# Patient Record
Sex: Male | Born: 1965 | Race: Black or African American | Hispanic: No | Marital: Married | Smoking: Never smoker
Health system: Southern US, Community
[De-identification: ages and names within clinical notes are randomized; demographics above are authoritative.]

## PROBLEM LIST (undated history)

## (undated) DIAGNOSIS — I10 Essential (primary) hypertension: Secondary | ICD-10-CM

## (undated) DIAGNOSIS — Z860101 Personal history of adenomatous and serrated colon polyps: Secondary | ICD-10-CM

## (undated) DIAGNOSIS — Z86010 Personal history of colonic polyps: Secondary | ICD-10-CM

## (undated) DIAGNOSIS — M1990 Unspecified osteoarthritis, unspecified site: Secondary | ICD-10-CM

## (undated) DIAGNOSIS — K4021 Bilateral inguinal hernia, without obstruction or gangrene, recurrent: Secondary | ICD-10-CM

## (undated) DIAGNOSIS — K219 Gastro-esophageal reflux disease without esophagitis: Secondary | ICD-10-CM

## (undated) DIAGNOSIS — E119 Type 2 diabetes mellitus without complications: Secondary | ICD-10-CM

## (undated) DIAGNOSIS — N401 Enlarged prostate with lower urinary tract symptoms: Secondary | ICD-10-CM

## (undated) DIAGNOSIS — E785 Hyperlipidemia, unspecified: Secondary | ICD-10-CM

## (undated) DIAGNOSIS — Z8619 Personal history of other infectious and parasitic diseases: Secondary | ICD-10-CM

## (undated) HISTORY — DX: Essential (primary) hypertension: I10

## (undated) HISTORY — DX: Unspecified osteoarthritis, unspecified site: M19.90

## (undated) HISTORY — PX: INGUINAL HERNIA REPAIR: SUR1180

## (undated) HISTORY — DX: Personal history of other infectious and parasitic diseases: Z86.19

## (undated) HISTORY — DX: Type 2 diabetes mellitus without complications: E11.9

---
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
## 2017-01-09 ENCOUNTER — Ambulatory Visit (HOSPITAL_COMMUNITY)
Admission: EM | Admit: 2017-01-09 | Discharge: 2017-01-09 | Disposition: A | Discharge status: Home / Self Care | Payer: Managed Care, Other (non HMO) | Attending: Internal Medicine | Admitting: Internal Medicine

## 2017-01-09 ENCOUNTER — Encounter (HOSPITAL_COMMUNITY): Payer: Self-pay | Admitting: Nurse Practitioner

## 2017-01-09 DIAGNOSIS — B9789 Other viral agents as the cause of diseases classified elsewhere: Secondary | ICD-10-CM | POA: Diagnosis not present

## 2017-01-09 DIAGNOSIS — J069 Acute upper respiratory infection, unspecified: Secondary | ICD-10-CM

## 2017-01-09 MED ORDER — BENZONATATE 100 MG PO CAPS: 100.0000 mg | ORAL_CAPSULE | Freq: Three times a day (TID) | ORAL | 0 refills | Status: DC

## 2017-01-09 MED ORDER — IPRATROPIUM BROMIDE 0.06 % NA SOLN: 2.0000 | Freq: Four times a day (QID) | NASAL | 0 refills | Status: DC

## 2017-01-09 NOTE — Discharge Instructions (Signed)
You most likely have a viral respiratory infection, this type of infection will not be helped by antibiotics.  For your symptoms I have prescribed ipratropium nasal spray, 2 sprays each nostril up to 4 times a day. For cough, I have prescribed a medication called Tessalon. Take 1 tablet every 8 hours as needed for your cough.    In addition to these therapies, I advise rest, plenty of fluids and management of symptoms with over the counter medicines. Over the counter therapies for your symptoms include:Tylenol as needed every 4-6 hours for body aches or fever, not to exceed 4,000 mg a day, Take mucinex or mucinex DM ever 12 hours with a full glass of water, you may use an inhaled steroid such as Flonase, 2 sprays each nostril once a day for congestion, or an antihistamine such as Claritin or Zyrtec once a day. Another alternative for congestion, is a pseudoephedrine containing product available from the pharmacist. Should your symptoms worsen or fail to resolve, follow up with your primary care provider or return to clinic.

## 2017-01-09 NOTE — ED Triage Notes (Signed)
Pt presents with c/o cough/cold. His symptoms began about 2 days ago. He c/o malaise, sweats, headache, congestion, productive cough. He has tried dayquil, cough tablets with no improvement.

## 2017-01-09 NOTE — ED Provider Notes (Signed)
  [place]   [id number] 01/09/17 Arrival Time: 7412   SUBJECTIVE:  Troy Arellano is a 51 y.o. male who presents to the urgent care with complaint of cough, congestion, runny nose and has been ongoing for 2 days. No vomiting, no diarrhea, no constipation or abdominal pain, no chest pain or palpitations, no wheezing or shortness of breath. No change in appetite, urinary output. He does not smoke, and has no history of lung disease such as asthma.     No past medical history on file. No family history on file. Social History   Social History  . Marital status: Married    Spouse name: N/A  . Number of children: N/A  . Years of education: N/A   Occupational History  . Not on file.   Social History Main Topics  . Smoking status: Never Smoker  . Smokeless tobacco: Never Used  . Alcohol use Yes  . Drug use: No  . Sexual activity: Not on file   Other Topics Concern  . Not on file   Social History Narrative  . No narrative on file   No outpatient prescriptions have been marked as taking for the 01/09/17 encounter [facility] Encounter).   No Known Allergies    ROS: As per HPI, remainder of ROS negative.   OBJECTIVE:   Vitals:   01/09/17 1804  BP: (!) 132/92  Pulse: 65  Resp: 16  Temp: 98.1 F (36.7 C)  TempSrc: Oral  SpO2: 99%     General appearance: alert; no distress Eyes: PERRL; EOMI; conjunctiva normal HENT: normocephalic; atraumatic; TMs normal, canal normal, external ears normal without trauma; nasal mucosa normal; oral mucosa normal Neck: supple, No cervical lymphadenopathy Lungs: clear to auscultation bilaterally Heart: regular rate and rhythm Abdomen: soft, non-tender Back: no CVA tenderness Extremities: no cyanosis or edema; symmetrical with no gross deformities Skin: warm and dry Neurologic: normal gait; grossly normal Psychological: alert and cooperative; normal mood and affect      Labs:  No results found for this or any  previous visit.  Labs Reviewed - No data to display  No results found.     ASSESSMENT & PLAN:  1. Viral URI with cough     Meds ordered this encounter  Medications  . benzonatate (TESSALON) 100 MG capsule    Sig: Take 1 capsule (100 mg total) by mouth every 8 (eight) hours.    Dispense:  21 capsule    Refill:  0    Order Specific Question:   Supervising Provider    Answer:   Sherlene Shams [[id number]]  . ipratropium (ATROVENT) 0.06 % nasal spray    Sig: Place 2 sprays into both nostrils 4 (four) times daily.    Dispense:  15 mL    Refill:  0    Order Specific Question:   Supervising Provider    Answer:   Sherlene Shams [[id number]]   Recommend Rest, fluids, over-the-counter therapies for symptom management. Return as needed Reviewed expectations re: course of current medical issues. Questions answered. Outlined signs and symptoms indicating need for more acute intervention. Patient verbalized understanding. After Visit Summary given.    Procedures:        Barnet Glasgow, NP 01/09/17 [phone]

## 2017-04-12 ENCOUNTER — Encounter (HOSPITAL_COMMUNITY): Payer: Self-pay

## 2017-04-12 ENCOUNTER — Other Ambulatory Visit: Payer: Self-pay

## 2017-04-12 ENCOUNTER — Emergency Department (HOSPITAL_COMMUNITY)
Admission: EM | Admit: 2017-04-12 | Discharge: 2017-04-12 | Disposition: A | Discharge status: Home / Self Care | Payer: Managed Care, Other (non HMO) | Attending: Emergency Medicine | Admitting: Emergency Medicine

## 2017-04-12 DIAGNOSIS — Y9389 Activity, other specified: Secondary | ICD-10-CM | POA: Diagnosis not present

## 2017-04-12 DIAGNOSIS — X58XXXA Exposure to other specified factors, initial encounter: Secondary | ICD-10-CM | POA: Diagnosis not present

## 2017-04-12 DIAGNOSIS — Y998 Other external cause status: Secondary | ICD-10-CM | POA: Insufficient documentation

## 2017-04-12 DIAGNOSIS — S39012A Strain of muscle, fascia and tendon of lower back, initial encounter: Secondary | ICD-10-CM

## 2017-04-12 DIAGNOSIS — Z79899 Other long term (current) drug therapy: Secondary | ICD-10-CM | POA: Insufficient documentation

## 2017-04-12 DIAGNOSIS — S3992XA Unspecified injury of lower back, initial encounter: Secondary | ICD-10-CM | POA: Diagnosis present

## 2017-04-12 DIAGNOSIS — Y929 Unspecified place or not applicable: Secondary | ICD-10-CM | POA: Diagnosis not present

## 2017-04-12 MED ORDER — TRAMADOL HCL 50 MG PO TABS: 50.0000 mg | ORAL_TABLET | Freq: Four times a day (QID) | ORAL | 0 refills | Status: DC | PRN

## 2017-04-12 MED ORDER — CYCLOBENZAPRINE HCL 10 MG PO TABS: 10.0000 mg | ORAL_TABLET | Freq: Two times a day (BID) | ORAL | 0 refills | Status: DC | PRN

## 2017-04-12 NOTE — ED Notes (Signed)
See EDP secondary assessment.  

## 2017-04-12 NOTE — ED Notes (Signed)
Dr. Darl Householder at bedside at this time.
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
## 2017-04-12 NOTE — Discharge Instructions (Signed)
Thank you for allowing [place] to provide your care.   Do not drive or operate large machinery while taking the cyclobenzaprine or tramadol.    Please establish care with a primary care doctor as soon as possible.

## 2017-04-12 NOTE — ED Triage Notes (Signed)
Pt states that he was moving a dresser two days ago and since lower back has been hurting

## 2017-06-19 ENCOUNTER — Ambulatory Visit: Payer: Self-pay

## 2017-06-19 ENCOUNTER — Encounter: Payer: Self-pay | Admitting: Podiatry

## 2017-06-19 DIAGNOSIS — M2140 Flat foot [pes planus] (acquired), unspecified foot: Secondary | ICD-10-CM

## 2017-07-03 ENCOUNTER — Encounter: Payer: Self-pay | Admitting: Podiatry

## 2017-07-03 ENCOUNTER — Ambulatory Visit (INDEPENDENT_AMBULATORY_CARE_PROVIDER_SITE_OTHER): Payer: BLUE CROSS/BLUE SHIELD

## 2017-07-03 ENCOUNTER — Ambulatory Visit: Payer: BLUE CROSS/BLUE SHIELD | Admitting: Podiatry

## 2017-07-03 VITALS — BP 133/73 | HR 67 | Resp 16

## 2017-07-03 DIAGNOSIS — M66872 Spontaneous rupture of other tendons, left ankle and foot: Secondary | ICD-10-CM | POA: Diagnosis not present

## 2017-07-03 DIAGNOSIS — M2140 Flat foot [pes planus] (acquired), unspecified foot: Secondary | ICD-10-CM | POA: Diagnosis not present

## 2017-07-03 MED ORDER — METHYLPREDNISOLONE 4 MG PO TBPK: ORAL_TABLET | ORAL | 0 refills | Status: DC

## 2017-07-03 MED ORDER — MELOXICAM 15 MG PO TABS: 15.0000 mg | ORAL_TABLET | Freq: Every day | ORAL | 3 refills | Status: DC

## 2017-07-03 NOTE — Progress Notes (Signed)
  Subjective:  Patient ID: Troy Arellano, male    DOB: December 18, 1965,  MRN: [id number] HPI Chief Complaint  Patient presents with  . 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 male presents with the above complaint.     No past medical history on file. No past surgical history on file. No current outpatient medications on file.  No Known Allergies Review of Systems  Musculoskeletal: Positive for gait problem.  All other systems reviewed and are negative.  Objective:   Vitals:   07/03/17 1412  BP: 133/73  Pulse: 67  Resp: 16    General: Well developed, nourished, in no acute distress, alert and oriented x3   Dermatological: Skin is warm, dry and supple bilateral. Nails x 10 are well maintained; remaining integument appears unremarkable at this time. There are no open sores, no preulcerative lesions, no rash or signs of infection present.  Vascular: Dorsalis Pedis artery and Posterior Tibial artery pedal pulses are 2/4 bilateral with immedate capillary fill time. Pedal hair growth present. No varicosities and no lower extremity edema present bilateral.   Neruologic: Grossly intact via light touch bilateral. Vibratory intact via tuning fork bilateral. Protective threshold with Semmes Wienstein monofilament intact to all pedal sites bilateral. Patellar and Achilles deep tendon reflexes 2+ bilateral. No Babinski or clonus noted bilateral.   Musculoskeletal: No gross boney pedal deformities bilateral. No pain, crepitus, or limitation noted with foot and ankle range of motion bilateral. Muscular strength 5/5 in all groups tested bilateral.  He has absolutely no inversion capability actively.  Passively his foot is easily and verbal but still retains calcaneal valgus.  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  There is no erythema cellulitis  drainage or odor there is moderate edema to the area.  Gait: Unassisted, Nonantalgic.    Radiographs:  3 views radiographs left foot taken today demonstrates an osseously mature foot with complete collapse of the medial longitudinal arch and severe abduction of the forefoot on the rear foot.  I do not see any coalitions of the subtalar joint.  There is soft tissue swelling along the medial aspect of the talonavicular joint and medial aspect of the ankle.  Assessment & Plan:   Assessment: Posterior tibial tendon tear or rupture left foot complete collapse of the medial longitudinal arch.  Rule out PT TD  Plan: Placed him on a Medrol Dosepak to be followed by indomethacin.  Discussed appropriate shoe gear stretching exercises ice therapy as your modifications.  At this point placed him in a Tri-Lock brace in place him out of work if necessary.  We are requesting MRI for evaluation of the tendon and rupture site for surgical repair.     Kameka Whan T. Wilson, [place]

## 2017-07-04 ENCOUNTER — Telehealth: Payer: Self-pay | Admitting: *Deleted

## 2017-07-04 DIAGNOSIS — M66872 Spontaneous rupture of other tendons, left ankle and foot: Secondary | ICD-10-CM

## 2017-07-04 NOTE — Telephone Encounter (Signed)
Orders to J. Quintana, RN for pre-cert, faxed to [facility]. 

## 2017-07-04 NOTE — Telephone Encounter (Signed)
-----   Message from [place] sent at 07/03/2017  2:29 PM EST ----- Regarding: MRI MRI ankle left - evaluate posterior tibial tendon tear left - surgical consideration

## 2017-07-07 ENCOUNTER — Telehealth: Payer: Self-pay | Admitting: Podiatry

## 2017-07-07 NOTE — Telephone Encounter (Signed)
I was in last week and I was calling about my MRI. Please call me back at [phone]. Thank you.

## 2017-07-07 NOTE — Telephone Encounter (Signed)
Left message informing pt, he could schedule his MRI for his convenience with [facility] [phone], and to allow 5-7 days between Friday and the day he scheduled so our office would be able to get the MRI pre-certed.

## 2017-07-07 NOTE — Telephone Encounter (Signed)
Yes I'm calling about an MRI appointment. Please call me back at [phone]. Thanks. Bye.

## 2017-07-10 ENCOUNTER — Telehealth: Payer: Self-pay | Admitting: Podiatry

## 2017-07-10 NOTE — Telephone Encounter (Signed)
I had a strap put on my left foot and the doctor was going to put a boot on as soon as possible? Can I come on a get a boot so it can support my ankle and foot more? My number is [phone]. Thank you.

## 2017-07-10 NOTE — Telephone Encounter (Signed)
Pt states he needs to be put in a boot. I told pt he could come in tomorrow before and get a boot.

## 2017-07-14 NOTE — Progress Notes (Signed)
This encounter was created in error - please disregard.

## 2017-07-19 ENCOUNTER — Ambulatory Visit
Admission: RE | Admit: 2017-07-19 | Discharge: 2017-07-19 | Disposition: A | Discharge status: Home / Self Care | Payer: Managed Care, Other (non HMO) | Source: Ambulatory Visit | Attending: Podiatry | Admitting: Podiatry

## 2017-07-21 ENCOUNTER — Telehealth: Payer: Self-pay | Admitting: *Deleted

## 2017-07-21 ENCOUNTER — Encounter: Payer: Self-pay | Admitting: *Deleted

## 2017-07-21 MED ORDER — MELOXICAM 15 MG PO TABS: 15.0000 mg | ORAL_TABLET | Freq: Every day | ORAL | 3 refills | Status: DC

## 2017-07-21 NOTE — Telephone Encounter (Signed)
Pt requested noted to be out of work today for ankle swelling and request refill of the medication that helped him sleep. I asked pt if it was the meloxicam and he said he thought so. I told him I would call in now and have the note ready for pick up in a few minutes. Pt states he will pick up the note tomorrow.

## 2017-07-21 NOTE — Telephone Encounter (Signed)
-----   Message from Garrel Ridgel, [place] sent at 07/21/2017  6:55 AM EDT ----- This one needs an over read and please inform patient of the delay. Thanks

## 2017-07-21 NOTE — Telephone Encounter (Signed)
My name is Erich Kochan. Can I get a nurse to call me back at [phone]. Thank you. Bye bye.

## 2017-07-21 NOTE — Addendum Note (Signed)
Addended by: Harriett Sine D on: 07/21/2017 02:32 PM   Modules accepted: Orders

## 2017-07-21 NOTE — Telephone Encounter (Signed)
Hello, this is Troy Arellano. I was calling to see if I can get a doctors note. I'm not able to go to work today my ankle is bothering me. My number is [phone]. I had an MRI on Saturday and my ankle has just been bothering me. I also wanted to know if I can get a prescription refill. Thank you.

## 2017-07-21 NOTE — Telephone Encounter (Signed)
Left message informing pt Dr. Milinda Pointer had reviewed MRI results and requested to send the MRI copy disc to a radiology specialist for more in depth reading for treatment planning, there would be a 7-10 day delay.

## 2017-07-21 NOTE — Telephone Encounter (Signed)
Hello, this is Troy Arellano. Is there any way I can get a call back at [phone]. Thank you.

## 2017-07-23 NOTE — Telephone Encounter (Signed)
Mailed copy of MRI disc to [facility]. 

## 2017-07-24 ENCOUNTER — Telehealth: Payer: Self-pay | Admitting: *Deleted

## 2017-07-24 NOTE — Telephone Encounter (Signed)
"  I called earlier to find out if I should be working or not.  He has me wearing this boot.  I drive a forklift.  Have you heard from the Radiology?  Call me back."

## 2017-07-24 NOTE — Telephone Encounter (Signed)
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 I told pt he could pick up the note tomorrow, and to get the short-term disability paper work he had mentioned. I told pt I would inform Dr. Milinda Pointer the need to be out of work. I told pt the radiology would not be back until 7-10 days from when I informed him previously and we would call to schedule his appt.

## 2017-07-24 NOTE — Telephone Encounter (Signed)
That will be fine. 

## 2017-07-25 ENCOUNTER — Telehealth: Payer: Self-pay | Admitting: *Deleted

## 2017-07-25 ENCOUNTER — Encounter: Payer: Self-pay | Admitting: *Deleted

## 2017-07-25 NOTE — Telephone Encounter (Signed)
Pt states he needs a note to be out of work not just 07/21/2017 and he wanted to know about scheduling surgery if he needed.

## 2017-07-25 NOTE — Telephone Encounter (Signed)
I told pt that I had the note ready for his employer, I just needed the date he was out other than 07/21/2017. Pt states he will pick up Monday, has an emergency with his mom. I asked pt if the date he was out 07/22/2017 and then to be out until he was evaluated when the final results of the MRI were received. Pt states 07/22/2017 would be the start date. I told pt the letter was ready.

## 2017-08-04 ENCOUNTER — Telehealth: Payer: Self-pay | Admitting: Podiatry

## 2017-08-04 ENCOUNTER — Encounter: Payer: Self-pay | Admitting: Podiatry

## 2017-08-04 NOTE — Telephone Encounter (Signed)
[facility] states will fax the over read results.

## 2017-08-04 NOTE — Telephone Encounter (Signed)
I informed pt the overread results had been received and transferred to schedulers.

## 2017-08-04 NOTE — Telephone Encounter (Signed)
I was calling back to see if my MRI results have come back yet so I can set surgery for my foot. My number is [phone].

## 2017-08-04 NOTE — Telephone Encounter (Signed)
Have him in. 

## 2017-08-05 ENCOUNTER — Ambulatory Visit: Payer: BLUE CROSS/BLUE SHIELD | Admitting: Podiatry

## 2017-08-05 ENCOUNTER — Encounter: Payer: Self-pay | Admitting: Podiatry

## 2017-08-05 DIAGNOSIS — M66872 Spontaneous rupture of other tendons, left ankle and foot: Secondary | ICD-10-CM | POA: Diagnosis not present

## 2017-08-06 NOTE — Progress Notes (Signed)
He presents today for follow-up of his MRI to his left foot.  States that the foot is still sore.  Objective: Vital signs are stable alert and oriented x3.  Pulses are palpable.  Neurologic sensorium is intact.  No change in orthopedic findings.  Radiographs and MRIs was reviewed do demonstrate what appears to be a flatfoot deformity more rigid in nature.  No major ruptures or tendon issues other than some mild tendinopathy's.  Assessment flat foot deformity osteoarthritis tendinitis.  Plan: We will try to get him into some type of corrective device.  He requested pain medication we declined.  This is a chronic issue we will not be able to provide him with chronic pain medication.  This was explained to him.  He was scanned for orthotics.  We discussed the possibility for surgical intervention he vehemently declined.

## 2017-08-08 ENCOUNTER — Encounter: Payer: Self-pay | Admitting: Podiatry

## 2017-09-01 ENCOUNTER — Other Ambulatory Visit: Payer: Self-pay | Admitting: Internal Medicine

## 2018-07-10 ENCOUNTER — Ambulatory Visit (HOSPITAL_COMMUNITY)
Admission: EM | Admit: 2018-07-10 | Discharge: 2018-07-10 | Disposition: A | Discharge status: Home / Self Care | Payer: BLUE CROSS/BLUE SHIELD | Attending: Internal Medicine | Admitting: Internal Medicine

## 2018-07-10 ENCOUNTER — Ambulatory Visit (INDEPENDENT_AMBULATORY_CARE_PROVIDER_SITE_OTHER): Payer: BLUE CROSS/BLUE SHIELD

## 2018-07-10 ENCOUNTER — Encounter (HOSPITAL_COMMUNITY): Payer: Self-pay | Admitting: Emergency Medicine

## 2018-07-10 DIAGNOSIS — M13161 Monoarthritis, not elsewhere classified, right knee: Secondary | ICD-10-CM

## 2018-07-10 DIAGNOSIS — W19XXXA Unspecified fall, initial encounter: Secondary | ICD-10-CM

## 2018-07-10 DIAGNOSIS — M25561 Pain in right knee: Secondary | ICD-10-CM | POA: Diagnosis not present

## 2018-07-10 DIAGNOSIS — S86911A Strain of unspecified muscle(s) and tendon(s) at lower leg level, right leg, initial encounter: Secondary | ICD-10-CM | POA: Diagnosis not present

## 2018-07-10 MED ORDER — MELOXICAM 15 MG PO TABS: 15.0000 mg | ORAL_TABLET | Freq: Every day | ORAL | 0 refills | Status: AC

## 2018-07-10 NOTE — ED Provider Notes (Signed)
[place]    CSN: [id number] Arrival date & time: 07/10/18  1438     History   Chief Complaint Chief Complaint  Patient presents with  . Knee Pain    HPI Troy Arellano is a 53 y.o. male.   He presents today with pain in his right medial knee.  He fell while walking up a hill, slid and impacted the right inner surface of his knee on the ground.  Thinks he struck a rock, had a large area of bruising and swelling at the medial aspect of the right knee.  This is all been steadily improving, but in the last couple of days he has had an increase in pain and a little bit of puffiness.  No new injury.  Has been taking Tylenol.    HPI  History reviewed. No pertinent past medical history.  History reviewed. No pertinent surgical history.     Home Medications    Prior to Admission medications   Medication Sig Start Date End Date Taking? Authorizing Provider  meloxicam (MOBIC) 15 MG tablet Take 1 tablet (15 mg total) by mouth daily for 10 days. 07/10/18 07/20/18  Wynona Luna, MD    Family History No family history on file.  Social History Social History   Tobacco Use  . Smoking status: Never Smoker  . Smokeless tobacco: Never Used  Substance Use Topics  . Alcohol use: Yes  . Drug use: No     Allergies   Patient has no known allergies.   Review of Systems Review of Systems  All other systems reviewed and are negative.    Physical Exam Triage Vital Signs ED Triage Vitals  Enc Vitals Group     BP 07/10/18 1507 129/72     Pulse Rate 07/10/18 1507 93     Resp 07/10/18 1507 16     Temp 07/10/18 1507 98.2 F (36.8 C)     Temp src --      SpO2 07/10/18 1507 98 %     Weight --      Height --      Pain Score 07/10/18 1508 7     Pain Loc --    Updated Vital Signs BP 129/72   Pulse 93   Temp 98.2 F (36.8 C)   Resp 16   SpO2 98%  Physical Exam Vitals signs and nursing note reviewed.  Constitutional:      General: He is not in acute  distress.    Comments: Alert, nicely groomed  HENT:     Head: Atraumatic.  Eyes:     Comments: Conjugate gaze, no eye redness/drainage  Neck:     Musculoskeletal: Neck supple.  Cardiovascular:     Rate and Rhythm: Normal rate.  Pulmonary:     Effort: No respiratory distress.  Abdominal:     General: There is no distension.  Musculoskeletal: Normal range of motion.     Comments: Bilateral knees are examined, and are similar in contour although the right medial knee is slightly swollen/bruised compared to the left.  There is mild warmth to palpation over the swollen area.  Full range of motion of both knees.  No distal leg swelling.  Skin:    General: Skin is warm and dry.     Comments: No cyanosis  Neurological:     Mental Status: He is alert and oriented to person, place, and time.      UC Treatments / Results    Radiology Dg  Knee Complete 4 Views Right  Result Date: 07/10/2018 CLINICAL DATA:  Right knee pain following a fall. EXAM: RIGHT KNEE - COMPLETE 4+ VIEW COMPARISON:  None. FINDINGS: No evidence of fracture, dislocation, or joint effusion. No evidence of arthropathy or other focal bone abnormality. Soft tissues are unremarkable. IMPRESSION: Negative. Electronically Signed   By: Claudie Revering M.D.   On: 07/10/2018 15:39     Final Clinical Impressions(s) / UC Diagnoses   Final diagnoses:  Knee strain, right, initial encounter  Monoarthritis of right knee     Discharge Instructions     Xrays of right knee at urgent care today were negative for fracture or bony injury.  Inner surface of knee with mild warmth/swelling consistent with inflammation.  Prescription for meloxicam (anti inflammatory/pain reliever) sent to the pharmacy.  Continue with ice for 5-10 minutes several times daily, to reduce swelling/pain.  Anticipate continued gradual improvement in swelling/pain over the next days to weeks.  Recheck or followup with sports medicine or orthopedics care provider if  not improving as expected.   ED Prescriptions    Medication Sig Dispense Auth. Provider   meloxicam (MOBIC) 15 MG tablet Take 1 tablet (15 mg total) by mouth daily for 10 days. 10 tablet Wynona Luna, MD        Wynona Luna, MD 07/13/18 [phone]

## 2018-07-10 NOTE — ED Triage Notes (Signed)
Pt states a few weeks ago he fell and hit his R knee, states he thought the pain would go away but it hasn't. States he hit his knee on a rock.

## 2018-07-10 NOTE — Discharge Instructions (Signed)
Xrays of right knee at urgent care today were negative for fracture or bony injury.  Inner surface of knee with mild warmth/swelling consistent with inflammation.  Prescription for meloxicam (anti inflammatory/pain reliever) sent to the pharmacy.  Continue with ice for 5-10 minutes several times daily, to reduce swelling/pain.  Anticipate continued gradual improvement in swelling/pain over the next days to weeks.  Recheck or followup with sports medicine or orthopedics care provider if not improving as expected.

## 2018-12-16 ENCOUNTER — Other Ambulatory Visit: Payer: Self-pay

## 2018-12-16 ENCOUNTER — Encounter (HOSPITAL_COMMUNITY): Payer: Self-pay | Admitting: Emergency Medicine

## 2018-12-16 ENCOUNTER — Ambulatory Visit (HOSPITAL_COMMUNITY)
Admission: EM | Admit: 2018-12-16 | Discharge: 2018-12-16 | Disposition: A | Discharge status: Home / Self Care | Payer: BLUE CROSS/BLUE SHIELD | Attending: Family Medicine | Admitting: Family Medicine

## 2018-12-16 DIAGNOSIS — M549 Dorsalgia, unspecified: Secondary | ICD-10-CM | POA: Diagnosis not present

## 2018-12-16 DIAGNOSIS — I10 Essential (primary) hypertension: Secondary | ICD-10-CM

## 2018-12-16 MED ORDER — PREDNISONE 10 MG (21) PO TBPK: ORAL_TABLET | Freq: Every day | ORAL | 0 refills | Status: DC

## 2018-12-16 MED ORDER — CYCLOBENZAPRINE HCL 10 MG PO TABS: ORAL_TABLET | ORAL | 0 refills | Status: DC

## 2018-12-16 NOTE — Discharge Instructions (Addendum)
HOME CARE INSTRUCTIONS: For many people, back pain returns. Since back pain is rarely dangerous, it is often a condition that people can learn to manage on their own. Please remain active. It is stressful on the back to sit or stand in one place. Do not sit, drive, or stand in one place for more than 30 minutes at a time. Take short walks on level surfaces as soon as pain allows. Try to increase the length of time you walk each day. Do not stay in bed. Resting more than 1 or 2 days can delay your recovery. Do not avoid exercise or work. Your body is made to move. It is not dangerous to be active, even though your back may hurt. Your back will likely heal faster if you return to being active before your pain is gone. Over-the-counter medicines to reduce pain and inflammation are often the most helpful.  SEEK MEDICAL CARE IF: You have pain that is not relieved with rest or medicine. You have pain that does not improve in 1 week. You have new symptoms. You are generally not feeling well.  SEEK IMMEDIATE MEDICAL CARE IF: You have pain that radiates from your back into your legs. You develop new bowel or bladder control problems. You have unusual weakness or numbness in your arms or legs. You develop nausea or vomiting. You develop abdominal pain. You feel faint.

## 2018-12-16 NOTE — ED Triage Notes (Signed)
This episode of back pain started last Wednesday.  Right shoulder and arm pain.  Patient reports numbness in right thumb, index finger and middle finger.   Back pain is a recurring issue.  Arm pain and numbness in fingers is new

## 2018-12-16 NOTE — ED Provider Notes (Signed)
[facility]   [id number] 12/16/18 Arrival Time: 2725  ASSESSMENT & PLAN:  1. Upper back pain on right side   2. Uncontrolled hypertension     Able to ambulate here and hemodynamically stable. No indication for imaging of back at this time given no trauma and normal neurological exam. Discussed.  Meds ordered this encounter  Medications  . cyclobenzaprine (FLEXERIL) 10 MG tablet    Sig: Take 1 tablet by mouth before bed as needed for muscle spasm. Warning: May cause drowsiness.    Dispense:  10 tablet    Refill:  0  . predniSONE (STERAPRED UNI-PAK 21 TAB) 10 MG (21) TBPK tablet    Sig: Take by mouth daily. Take as directed.    Dispense:  21 tablet    Refill:  0    Medication sedation precautions given. Encourage ROM/movement as tolerated.    Follow-up Information    [facility].   Specialty: Urgent Care Why: As needed. Contact information: [place] [phone]         Encouraged to take HTN medication regularly and f/u for recheck in one week.  Reviewed expectations re: course of current medical issues. Questions answered. Outlined signs and symptoms indicating need for more acute intervention. Patient verbalized understanding. After Visit Summary given.   SUBJECTIVE: History from: patient.  Jionni Helming is a 53 y.o. male who presents with complaint of intermittent right sided upper back discomfort. Onset gradual, over the past 4-5 days. Injury/trama: no. History of back problems: no. Discomfort described as aching with occasional sharp pain; without radiation. Pain is unchanged with prolonged walking/standing, worse with movement of his RUE, and better with rest. Extremity sensation changes or weakness: none. Ambulatory without difficulty. No associated chest pain/abdominal pain/n/v. Self treatment: has tried OTC analgesics without much relief.  Reports no chronic steroid  use, fevers, IV drug use, or recent back surgeries or procedures.  Increased blood pressure noted today. Reports that he has been treated for hypertension in the past. "Occasionally take my medicine."  He reports no chest pain on exertion, no dyspnea on exertion, no swelling of ankles, no orthostatic dizziness or lightheadedness, no orthopnea or paroxysmal nocturnal dyspnea, no palpitations and no intermittent claudication symptoms.  ROS: As per HPI. All other systems negative.   OBJECTIVE:  Vitals:   12/16/18 1027  BP: (!) 163/107  Pulse: 72  Resp: 18  Temp: 97.8 F (36.6 C)  TempSrc: Oral  SpO2: 98%    General appearance: alert; no distress Neck: supple with FROM; without midline tenderness CV: RRR Lungs: unlabored respirations; symmetrical air entry Abdomen: soft, non-tender; non-distended Back: mild to moderate right sided tenderness of his upper paraspinal musculature; FROM at waist; bruising: none; without midline tenderness Extremities: no edema; symmetrical with no gross deformities; normal ROM of bilateral lower extremities Skin: warm and dry Neurologic: normal gait; normal reflexes of RUE and LUE; normal sensation of RUE and LUE; normal strength of RUE and LUE Psychological: alert and cooperative; normal mood and affect  No Known Allergies   Social History   Socioeconomic History  . Marital status: Married    Spouse name: Not on file  . Number of children: Not on file  . Years of education: Not on file  . Highest education level: Not on file  Occupational History  . Not on file  Social Needs  . Financial resource strain: Not on file  . Food insecurity  Worry: Not on file    Inability: Not on file  . Transportation needs    Medical: Not on file    Non-medical: Not on file  Tobacco Use  . Smoking status: Never Smoker  . Smokeless tobacco: Never Used  Substance and Sexual Activity  . Alcohol use: Yes  . Drug use: No  . Sexual activity: Not on file   Lifestyle  . Physical activity    Days per week: Not on file    Minutes per session: Not on file  . Stress: Not on file  Relationships  . Social Herbalist on phone: Not on file    Gets together: Not on file    Attends religious service: Not on file    Active member of club or organization: Not on file    Attends meetings of clubs or organizations: Not on file    Relationship status: Not on file  . Intimate partner violence    Fear of current or ex partner: Not on file    Emotionally abused: Not on file    Physically abused: Not on file    Forced sexual activity: Not on file  Other Topics Concern  . Not on file  Social History Narrative  . Not on file   FH: HTN  History reviewed. No pertinent surgical history.   Vanessa Kick, MD 12/16/18 1115

## 2019-08-13 ENCOUNTER — Emergency Department (HOSPITAL_COMMUNITY)
Admission: EM | Admit: 2019-08-13 | Discharge: 2019-08-13 | Disposition: A | Discharge status: Home / Self Care | Payer: BC Managed Care – PPO | Attending: Emergency Medicine | Admitting: Emergency Medicine

## 2019-08-13 ENCOUNTER — Encounter (HOSPITAL_COMMUNITY): Payer: Self-pay

## 2019-08-13 ENCOUNTER — Ambulatory Visit (INDEPENDENT_AMBULATORY_CARE_PROVIDER_SITE_OTHER): Payer: BC Managed Care – PPO

## 2019-08-13 ENCOUNTER — Ambulatory Visit (HOSPITAL_COMMUNITY)
Admission: EM | Admit: 2019-08-13 | Discharge: 2019-08-13 | Disposition: A | Discharge status: Home / Self Care | Payer: BC Managed Care – PPO | Source: Home / Self Care | Attending: Emergency Medicine | Admitting: Emergency Medicine

## 2019-08-13 ENCOUNTER — Other Ambulatory Visit: Payer: Self-pay

## 2019-08-13 ENCOUNTER — Emergency Department (HOSPITAL_COMMUNITY): Payer: BC Managed Care – PPO

## 2019-08-13 DIAGNOSIS — M5412 Radiculopathy, cervical region: Secondary | ICD-10-CM | POA: Diagnosis not present

## 2019-08-13 DIAGNOSIS — M25511 Pain in right shoulder: Secondary | ICD-10-CM

## 2019-08-13 DIAGNOSIS — Z79899 Other long term (current) drug therapy: Secondary | ICD-10-CM | POA: Insufficient documentation

## 2019-08-13 DIAGNOSIS — R9431 Abnormal electrocardiogram [ECG] [EKG]: Secondary | ICD-10-CM | POA: Diagnosis not present

## 2019-08-13 DIAGNOSIS — R079 Chest pain, unspecified: Secondary | ICD-10-CM

## 2019-08-13 LAB — BASIC METABOLIC PANEL
Anion gap: 7 (ref 5–15)
BUN: 12 mg/dL (ref 6–20)
CO2: 24 mmol/L (ref 22–32)
Calcium: 9 mg/dL (ref 8.9–10.3)
Chloride: 102 mmol/L (ref 98–111)
Creatinine, Ser: 0.74 mg/dL (ref 0.61–1.24)
GFR calc Af Amer: >60 mL/min (ref >60)
GFR calc non Af Amer: >60 mL/min (ref >60)
Glucose, Bld: 108 mg/dL — ABNORMAL HIGH (ref 70–99)
Potassium: 3.6 mmol/L (ref 3.5–5.1)
Sodium: 133 mmol/L — ABNORMAL LOW (ref 135–145)

## 2019-08-13 LAB — CBC
HCT: 42.8 % (ref 39.0–52.0)
Hemoglobin: 13.2 g/dL (ref 13.0–17.0)
MCH: 28.3 pg (ref 26.0–34.0)
MCHC: 30.8 g/dL (ref 30.0–36.0)
MCV: 91.8 fL (ref 80.0–100.0)
Platelets: 193 10*3/uL (ref 150–400)
RBC: 4.66 MIL/uL (ref 4.22–5.81)
RDW: 13.8 % (ref 11.5–15.5)
WBC: 5.8 10*3/uL (ref 4.0–10.5)
nRBC: 0 % (ref 0.0–0.2)

## 2019-08-13 LAB — TROPONIN I (HIGH SENSITIVITY)
Troponin I (High Sensitivity): 5 ng/L (ref <18)
Troponin I (High Sensitivity): 5 ng/L (ref <18)

## 2019-08-13 MED ORDER — PREDNISONE 20 MG PO TABS: ORAL_TABLET | ORAL | 0 refills | Status: DC

## 2019-08-13 MED ORDER — ASPIRIN 81 MG PO CHEW
324.0000 mg | CHEWABLE_TABLET | Freq: Once | ORAL | Status: AC
  Administered 2019-08-13: 11:00:00 324 mg via ORAL

## 2019-08-13 MED ORDER — ASPIRIN 81 MG PO CHEW
CHEWABLE_TABLET | ORAL | Status: AC
  Filled 2019-08-13: qty 4

## 2019-08-13 NOTE — Discharge Instructions (Signed)
If you develop severe uncontrolled neck or chest pain, weakness or numbness in your arms or legs, incontinence of your bowels or bladder, fever, or any other new/concerning symptoms then return to the ER for evaluation.

## 2019-08-13 NOTE — ED Triage Notes (Signed)
Pt reports right shoulder pain that now radiates to his chest. Pt sent from UC by EMS for further evaluation. Pt a.o, nad noted.

## 2019-08-13 NOTE — ED Triage Notes (Signed)
Pt c/o right shoulder pain onset approx 2 weeks ago. Pt states pt has started to radiate to involve his right neck, thoracic area, right elbow and forearm. 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  Pt reports he had a virtual medical visit and they prescribed diclofen; which pt reports is not improving his symptoms. Pt reports new numbness to right fingers. +2 radial pulse noted.

## 2019-08-13 NOTE — ED Notes (Signed)
Report given to Magda Paganini, RN at [facility] ED.

## 2019-08-13 NOTE — ED Provider Notes (Signed)
[facility] EMERGENCY DEPARTMENT Provider Note   CSN: [id number] Arrival date & time: 08/13/19  1151     History Chief Complaint  Patient presents with  . Chest Pain  . Shoulder Pain    Troy Arellano is a 54 y.o. male.  HPI 54 year old male sent from urgent care for right shoulder pain.  Ongoing for a couple weeks.  Comes and goes multiple times per day, he estimates about 4 or 5 times a day and seems to last about 10 to 15 minutes of time.  Often he will take ibuprofen for it.  Recently he is also noticed some pain going into his right chest though is fleeting.  No consistent chest pain or shortness of breath.  The pain is more in the back of his shoulder and sometimes now in his neck.  Goes down his arm sometimes and occasionally he will have tingling in his second and third digit tips.  No weakness or numbness to the arms.  No fevers.  No shortness of breath.  History reviewed. No pertinent past medical history.  There are no problems to display for this patient.   History reviewed. No pertinent surgical history.     Family History  Problem Relation Age of Onset  . Healthy Mother   . Healthy Father     Social History   Tobacco Use  . Smoking status: Never Smoker  . Smokeless tobacco: Never Used  Substance Use Topics  . Alcohol use: Yes  . Drug use: No    Home Medications Prior to Admission medications   Medication Sig Start Date End Date Taking? Authorizing Provider  Aspirin-Salicylamide-Caffeine (BC HEADACHE PO) Take by mouth.    [provider]  cyclobenzaprine (FLEXERIL) 10 MG tablet Take 1 tablet by mouth before bed as needed for muscle spasm. Warning: May cause drowsiness. 12/16/18   Vanessa Kick, MD  diclofenac (VOLTAREN) 75 MG EC tablet Take 75 mg by mouth 2 (two) times daily. 08/09/19   [provider]  ibuprofen (ADVIL) 200 MG tablet Take 200 mg by mouth every 6 (six) hours as needed.    [provider]   minocycline (MINOCIN) 100 MG capsule minocycline 100 mg capsule    [provider]  predniSONE (DELTASONE) 20 MG tablet 3 tabs po daily x 3 days, then 2 tabs x 3 days, then 1.5 tabs x 3 days, then 1 tab x 3 days, then 0.5 tabs x 3 days 08/13/19   Sherwood Gambler, MD    Allergies    Patient has no known allergies.  Review of Systems   Review of Systems  Constitutional: Negative for fever.  Respiratory: Negative for shortness of breath.   Cardiovascular: Negative for chest pain.  Gastrointestinal: Negative for vomiting.  Musculoskeletal: Positive for myalgias. Negative for back pain.  Neurological: Positive for numbness (tingling). Negative for weakness.  All other systems reviewed and are negative.   Physical Exam Updated Vital Signs BP (!) 143/86   Pulse (!) 59   Temp 98.3 F (36.8 C) (Oral)   Resp 18   SpO2 100%   Physical Exam Vitals and nursing note reviewed.  Constitutional:      General: He is not in acute distress.    Appearance: He is well-developed. He is obese. He is not ill-appearing or diaphoretic.  HENT:     Head: Normocephalic and atraumatic.     Right Ear: External ear normal.     Left Ear: External ear normal.  Nose: Nose normal.  Eyes:     General:        Right eye: No discharge.        Left eye: No discharge.  Cardiovascular:     Rate and Rhythm: Normal rate and regular rhythm.     Pulses:          Radial pulses are 2+ on the right side and 2+ on the left side.     Heart sounds: Normal heart sounds.  Pulmonary:     Effort: Pulmonary effort is normal.     Breath sounds: Normal breath sounds.  Chest:     Chest wall: No tenderness.  Abdominal:     Palpations: Abdomen is soft.     Tenderness: There is no abdominal tenderness.  Musculoskeletal:     Right shoulder: No swelling, deformity or tenderness. Normal range of motion.     Cervical back: Normal range of motion and neck supple. No spinous process tenderness or muscular tenderness.   Skin:    General: Skin is warm and dry.  Neurological:     Mental Status: He is alert.     Comments: 5/5 strength in BUE. Normal gross sensation  Psychiatric:        Mood and Affect: Mood is not anxious.     ED Results / Procedures / Treatments   Labs (all labs ordered are listed, but only abnormal results are displayed) Labs Reviewed  BASIC METABOLIC PANEL - Abnormal; Notable for the following components:      Result Value   Sodium 133 (*)    Glucose, Bld 108 (*)    All other components within normal limits  CBC  TROPONIN I (HIGH SENSITIVITY)  TROPONIN I (HIGH SENSITIVITY)    EKG EKG Interpretation  Date/Time:  Friday August 13 2019 17:34:42 EDT Ventricular Rate:  60 PR Interval:    QRS Duration: 130 QT Interval:  444 QTC Calculation: 444 R Axis:   -36 Text Interpretation: Sinus rhythm IVCD, consider atypical RBBB Borderline ST elevation, lateral leads no significant change since earlier in the day Confirmed by Sherwood Gambler [phone]) on 08/13/2019 5:58:28 PM   Radiology DG Chest 2 View  Result Date: 08/13/2019 CLINICAL DATA:  Chest pain EXAM: CHEST - 2 VIEW COMPARISON:  August 13, 2019 11:30 a.m. FINDINGS: The heart size and mediastinal contours are within normal limits. Both lungs are clear. The visualized skeletal structures are unremarkable. IMPRESSION: No active cardiopulmonary disease. Electronically Signed   By: Prudencio Pair M.D.   On: 08/13/2019 12:27   DG Chest 2 View  Result Date: 08/13/2019 CLINICAL DATA:  Right shoulder pain. Right chest pain. EXAM: CHEST - 2 VIEW COMPARISON:  None. FINDINGS: Heart size is normal. Lung volumes are low. No edema or effusion is present. Focal airspace disease is present. IMPRESSION: Low lung volumes.  No acute cardiopulmonary disease. Electronically Signed   By: San Morelle M.D.   On: 08/13/2019 11:28    Procedures Procedures (including critical care time)  Medications Ordered in ED Medications - No data to display   ED Course  I have reviewed the triage vital signs and the nursing notes.  Pertinent labs & imaging results that were available during my care of the patient were reviewed by me and considered in my medical decision making (see chart for details).    MDM Rules/Calculators/A&P                      Patient's pain sounds  like cervical radiculopathy. While he has some occasional right sided chest discomfort, my suspicion of PE, ACS, or dissection is quite low. No symptoms now. Given radicular symptoms and tingling, will give steroids and refer to PCP. Neurovascular exam is benign. No indication for emergent MRI.  Final Clinical Impression(s) / ED Diagnoses Final diagnoses:  Cervical radiculopathy    Rx / DC Orders ED Discharge Orders         Ordered    predniSONE (DELTASONE) 20 MG tablet     08/13/19 1858           Sherwood Gambler, MD 08/13/19 2326

## 2019-08-13 NOTE — ED Notes (Signed)
EMS at the bedside. Dr. Alphonzo Cruise given bedside report. Pt is stable.

## 2019-08-13 NOTE — ED Notes (Signed)
I called 911 to get EMS to transport pt to ED ASAP.

## 2019-08-13 NOTE — ED Provider Notes (Signed)
HPI  SUBJECTIVE:  Troy Arellano is a 54 y.o. male who presents with 2 weeks of constant, daily sore, throbbing right shoulder pain that radiates down his arm into his neck.  He states it is now radiating to his right chest.  He had a phone visit with his insurance company 4 days ago was prescribed diclofenac without improvement in his symptoms.  He reports tingling going down to his fingers.  He denies chest pressure, heaviness.  No shortness of breath nausea diaphoresis palpitations.  No exertional component.  No fevers.  No grip weakness.  No joint swelling, erythema.  No cough.  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  No trauma to his shoulder, neck.  No nausea vomiting abdominal pain.  He has been applying IcyHot twice a day, ibuprofen 400 every 4 hours with mild temporary improvement in his symptoms.  He has also tried taking diclofenac without help.  Symptoms are worse with lying on his right side.  Is not associated with movement, exertion.  He denies sleeping on his neck or shoulder wrong.  No antipyretic in the past 4 to 6 hours.  He is ambidextrous.  He has a past medical history of hypertension, thoracic back injury.  No history of MI coronary artery disease neck shoulder injury diabetes smoking hypercholesterolemia.  No history of cancer gallbladder or liver disease.  Family history negative for early for MI.   PMD: Dr. Theodoro Grist  Patient has been seen here in August 2020 for upper back pain on the right side but have a musculoskeletal cause, sent home with Flexeril and prednisone.  No recent care everywhere records available.   History reviewed. No pertinent past medical history.  History reviewed. No pertinent surgical history.  Family History  Problem Relation Age of Onset  . Healthy Mother   . Healthy Father     Social History   Tobacco Use  . Smoking status: Never Smoker  . Smokeless tobacco: Never Used  Substance Use  Topics  . Alcohol use: Yes  . Drug use: No    No current facility-administered medications for this encounter.  Current Outpatient Medications:  .  diclofenac (VOLTAREN) 75 MG EC tablet, Take 75 mg by mouth 2 (two) times daily., Disp: , Rfl:  .  minocycline (MINOCIN) 100 MG capsule, minocycline 100 mg capsule, Disp: , Rfl:  .  Aspirin-Salicylamide-Caffeine (BC HEADACHE PO), Take by mouth., Disp: , Rfl:  .  cyclobenzaprine (FLEXERIL) 10 MG tablet, Take 1 tablet by mouth before bed as needed for muscle spasm. Warning: May cause drowsiness., Disp: 10 tablet, Rfl: 0 .  ibuprofen (ADVIL) 200 MG tablet, Take 200 mg by mouth every 6 (six) hours as needed., Disp: , Rfl:  .  predniSONE (STERAPRED UNI-PAK 21 TAB) 10 MG (21) TBPK tablet, Take by mouth daily. Take as directed., Disp: 21 tablet, Rfl: 0  No Known Allergies   ROS  As noted in HPI.   Physical Exam  BP (!) 155/86 (BP Location: Left Arm)   Pulse 82   Temp 97.8 F (36.6 C)   Resp 20   SpO2 98%   Constitutional: Well developed, well nourished, no acute distress Eyes:  EOMI, conjunctiva normal bilaterally HENT: Normocephalic, atraumatic,mucus membranes moist Respiratory: Normal inspiratory effort lungs clear bilaterally Cardiovascular: Normal rate regular rhythm no murmurs rubs or gallops GI: nondistended nontender, active bowel sounds.  No guarding, rebound.  Negative Murphy, skin: No rash, skin intact Musculoskeletal: No  C-spine, trapezial tenderness.  No trapezial spasm.  No tenderness over the right shoulder joint.  No tenderness over the clavicle, AC joint, deltoid.  No tenderness over the biceps tendon.  No pain with internal/external rotation.  No pain with abduction past 90 degrees.  Grip strength 5/5 and equal bilaterally.  Sensation grossly intact and equal distally.  RP 2+ equal bilaterally.  Negative empty can test.  Negative liftoff test. Neurologic: Alert & oriented x 3, no focal neuro deficits Psychiatric: Speech  and behavior appropriate   ED Course   Medications  aspirin chewable tablet 324 mg (324 mg Oral Given 08/13/19 1125)    Orders Placed This Encounter  Procedures  . DG Chest 2 View    Standing Status:   Standing    Number of Occurrences:   1    Order Specific Question:   Reason for Exam (SYMPTOM  OR DIAGNOSIS REQUIRED)    Answer:   R shoulder, R CP  . ED EKG    R Shoulder pain    Standing Status:   Standing    Number of Occurrences:   1    Order Specific Question:   Reason for Exam    Answer:   Other (See Comments)    No results found for this or any previous visit (from the past 24 hour(s)). DG Chest 2 View  Result Date: 08/13/2019 CLINICAL DATA:  Right shoulder pain. Right chest pain. EXAM: CHEST - 2 VIEW COMPARISON:  None. FINDINGS: Heart size is normal. Lung volumes are low. No edema or effusion is present. Focal airspace disease is present. IMPRESSION: Low lung volumes.  No acute cardiopulmonary disease. Electronically Signed   By: San Morelle M.D.   On: 08/13/2019 11:28    ED Clinical Impression  1. Acute pain of right shoulder   2. ST elevation      ED Assessment/Plan  Previous records reviewed as noted in HPI.  I am unable to reproduce the shoulder pain with movement or with palpation.  Getting EKG to rule out cardiac cause and chest x-ray.  Patient was symptomatic while EKG was obtained.  There is no C-spine tenderness  EKG: Normal sinus rhythm rate 62.  Left axis deviation.  Normal intervals.  No hypertrophy.  ST elevation in V2 V3 with no reciprocal changes.  No previous EKG for comparison.  Independently reviewed chest x-ray.  No consolidation as read by me.  Formal read pending.  Concern for ACS.  Giving 324 mg of aspirin, transferring to the [facility] ED for comprehensive work-up via EMS.  Discussed EKG, rationale for transfer with patient with patient, patient agrees with plan.  Meds ordered this encounter  Medications  . aspirin chewable tablet 324  mg    *This clinic note was created using [employer]. Therefore, there may be occasional mistakes despite careful proofreading.   ?    Melynda Ripple, MD 08/13/19 1134

## 2019-08-27 ENCOUNTER — Other Ambulatory Visit: Payer: Self-pay

## 2019-08-30 ENCOUNTER — Ambulatory Visit (INDEPENDENT_AMBULATORY_CARE_PROVIDER_SITE_OTHER): Payer: BC Managed Care – PPO | Admitting: Physician Assistant

## 2019-08-30 ENCOUNTER — Ambulatory Visit (INDEPENDENT_AMBULATORY_CARE_PROVIDER_SITE_OTHER): Payer: BC Managed Care – PPO

## 2019-08-30 ENCOUNTER — Encounter: Payer: Self-pay | Admitting: Physician Assistant

## 2019-08-30 ENCOUNTER — Other Ambulatory Visit: Payer: Self-pay

## 2019-08-30 VITALS — BP 140/90 | HR 67 | Temp 97.7°F | Ht 68.0 in | Wt 231.4 lb

## 2019-08-30 DIAGNOSIS — G8929 Other chronic pain: Secondary | ICD-10-CM

## 2019-08-30 DIAGNOSIS — Z136 Encounter for screening for cardiovascular disorders: Secondary | ICD-10-CM | POA: Diagnosis not present

## 2019-08-30 DIAGNOSIS — Z114 Encounter for screening for human immunodeficiency virus [HIV]: Secondary | ICD-10-CM

## 2019-08-30 DIAGNOSIS — R238 Other skin changes: Secondary | ICD-10-CM

## 2019-08-30 DIAGNOSIS — M25511 Pain in right shoulder: Secondary | ICD-10-CM

## 2019-08-30 DIAGNOSIS — Z13220 Encounter for screening for lipoid disorders: Secondary | ICD-10-CM

## 2019-08-30 DIAGNOSIS — I10 Essential (primary) hypertension: Secondary | ICD-10-CM | POA: Diagnosis not present

## 2019-08-30 DIAGNOSIS — Z1211 Encounter for screening for malignant neoplasm of colon: Secondary | ICD-10-CM

## 2019-08-30 DIAGNOSIS — E669 Obesity, unspecified: Secondary | ICD-10-CM

## 2019-08-30 LAB — LIPID PANEL
Cholesterol: 239 mg/dL — ABNORMAL HIGH (ref 0–200)
HDL: 44.2 mg/dL (ref >39.00)
NonHDL: 194.41
Total CHOL/HDL Ratio: 5
Triglycerides: 262 mg/dL — ABNORMAL HIGH (ref 0.0–149.0)
VLDL: 52.4 mg/dL — ABNORMAL HIGH (ref 0.0–40.0)

## 2019-08-30 LAB — CBC WITH DIFFERENTIAL/PLATELET
Basophils Absolute: 0 10*3/uL (ref 0.0–0.1)
Basophils Relative: 0.7 % (ref 0.0–3.0)
Eosinophils Absolute: 0.1 10*3/uL (ref 0.0–0.7)
Eosinophils Relative: 1.7 % (ref 0.0–5.0)
HCT: 40.3 % (ref 39.0–52.0)
Hemoglobin: 13.4 g/dL (ref 13.0–17.0)
Lymphocytes Relative: 38.5 % (ref 12.0–46.0)
Lymphs Abs: 1.8 10*3/uL (ref 0.7–4.0)
MCHC: 33.3 g/dL (ref 30.0–36.0)
MCV: 88.2 fl (ref 78.0–100.0)
Monocytes Absolute: 0.4 10*3/uL (ref 0.1–1.0)
Monocytes Relative: 9.2 % (ref 3.0–12.0)
Neutro Abs: 2.4 10*3/uL (ref 1.4–7.7)
Neutrophils Relative %: 49.9 % (ref 43.0–77.0)
Platelets: 200 10*3/uL (ref 150.0–400.0)
RBC: 4.57 Mil/uL (ref 4.22–5.81)
RDW: 14.3 % (ref 11.5–15.5)
WBC: 4.8 10*3/uL (ref 4.0–10.5)

## 2019-08-30 LAB — COMPREHENSIVE METABOLIC PANEL
ALT: 42 U/L (ref 0–53)
AST: 28 U/L (ref 0–37)
Albumin: 4 g/dL (ref 3.5–5.2)
Alkaline Phosphatase: 51 U/L (ref 39–117)
BUN: 13 mg/dL (ref 6–23)
CO2: 30 mEq/L (ref 19–32)
Calcium: 9.4 mg/dL (ref 8.4–10.5)
Chloride: 101 mEq/L (ref 96–112)
Creatinine, Ser: 0.9 mg/dL (ref 0.40–1.50)
GFR: 106.76 mL/min (ref >60.00)
Glucose, Bld: 95 mg/dL (ref 70–99)
Potassium: 4 mEq/L (ref 3.5–5.1)
Sodium: 136 mEq/L (ref 135–145)
Total Bilirubin: 0.7 mg/dL (ref 0.2–1.2)
Total Protein: 7.2 g/dL (ref 6.0–8.3)

## 2019-08-30 LAB — LDL CHOLESTEROL, DIRECT: Direct LDL: 148 mg/dL

## 2019-08-30 LAB — HEMOGLOBIN A1C: Hgb A1c MFr Bld: 6.3 % (ref 4.6–6.5)

## 2019-08-30 MED ORDER — HYDROCHLOROTHIAZIDE 25 MG PO TABS: 25.0000 mg | ORAL_TABLET | Freq: Every day | ORAL | 3 refills | Status: DC

## 2019-08-30 MED ORDER — MELOXICAM 15 MG PO TABS: 15.0000 mg | ORAL_TABLET | Freq: Every day | ORAL | 1 refills | Status: DC

## 2019-08-30 NOTE — Patient Instructions (Signed)
It was great to see you!  I will be in touch with your lab results and your xray, as well as the plan for your shoulder.  I have refilled your meloxicam -- I do not want you to take advil while taking this medication.  Let's follow-up in 1 month to discuss weight loss, sooner if you have concerns.  Take care,  [name] PA-C

## 2019-08-30 NOTE — Progress Notes (Signed)
Troy Arellano is a 54 y.o. male here to Establish care.  I acted as a Education administrator for [employer], PA-C Troy Pickler, LPN   History of Present Illness:   Chief Complaint  Patient presents with  . Establish Care  . Shoulder Pain  . Recurrent Skin Infections    Shoulder pain Pt c/o right shoulder pain x 1 month, this radiates into his neck. He was seen at Urgent Care and given Meloxicam 15 mg with relief. He states that when he doesn't take meloxicam his pain can be an 8 out of 10.  He has had a history of physical therapy in the past for his neck and shoulder. Denies numbness or tingling.  He is left-handed.  Skin irritation Pt c/o skin irritation on right knee and right outer calf x 3 months. Itching off and on. He saw Dermatology in March but was not given anything. Has tried lotion, topical alcohol.  Feels like the area is not improving with time.  He is continuing to go to dermatology for keloid management.  HTN Currently taking HCTZ 25 mg. Has been out of this medication for a few days. At home blood pressure readings are: usually well controlled. Patient denies chest pain, SOB, blurred vision, dizziness, unusual headaches, lower leg swelling. Patient is compliant with medication. Denies excessive caffeine intake, stimulant usage, excessive alcohol intake, or increase in salt consumption.  BP Readings from Last 3 Encounters:  08/30/19 140/90  08/13/19 (!) 143/86  08/13/19 (!) 155/86   Obesity Patient is interested in weight loss.  It has been over 5 years since most recent labs.  He denies family history of diabetes.  Need for colonoscopy Patient has never had a colonoscopy.  He denies any rectal bleeding or other concerns at this time.  He does not have a family history of colon cancer.  Weight -- Weight: 231 lb 6.1 oz (105 kg)    Depression screen [facility] 2/9 08/30/2019  Decreased Interest 0  Down, Depressed, Hopeless 0  PHQ - 2 Score 0    No flowsheet data  found.   Other providers/specialists: Patient Care Team: Troy Arellano, [place] as PCP - General (Physician Assistant)   Past Medical History:  Diagnosis Date  . Arthritis    Back  . History of chicken pox   . Hypertension      Social History   Socioeconomic History  . Marital status: Married    Spouse name: Not on file  . Number of children: Not on file  . Years of education: Not on file  . Highest education level: Not on file  Occupational History  . Not on file  Tobacco Use  . Smoking status: Never Smoker  . Smokeless tobacco: Never Used  Substance and Sexual Activity  . Alcohol use: Yes    Comment: 2-3 drinks a week  . Drug use: No  . Sexual activity: Yes  Other Topics Concern  . Not on file  Social History Narrative   Forklift driver   Single   51 yo son (in [place])   Social Determinants of Health   Financial Resource Strain:   . Difficulty of Paying Living Expenses:   Food Insecurity:   . Worried About Charity fundraiser in the Last Year:   . Arboriculturist in the Last Year:   Transportation Needs:   . Film/video editor (Medical):   Marland Kitchen Lack of Transportation (Non-Medical):   Physical Activity:   . Days of Exercise  per Week:   . Minutes of Exercise per Session:   Stress:   . Feeling of Stress :   Social Connections:   . Frequency of Communication with Friends and Family:   . Frequency of Social Gatherings with Friends and Family:   . Attends Religious Services:   . Active Member of Clubs or Organizations:   . Attends Archivist Meetings:   Marland Kitchen Marital Status:   Intimate Partner Violence:   . Fear of Current or Ex-Partner:   . Emotionally Abused:   Marland Kitchen Physically Abused:   . Sexually Abused:     Past Surgical History:  Procedure Laterality Date  . HERNIA REPAIR Right 1987    Family History  Problem Relation Age of Onset  . Hypertension Mother   . Hypertension Father   . Lymphoma Sister     No Known Allergies   Current  Medications:   Current Outpatient Medications:  .  Aspirin-Salicylamide-Caffeine (BC HEADACHE PO), Take by mouth., Disp: , Rfl:  .  hydrochlorothiazide (HYDRODIURIL) 25 MG tablet, Take 1 tablet (25 mg total) by mouth daily., Disp: 90 tablet, Rfl: 3 .  ibuprofen (ADVIL) 200 MG tablet, Take 200 mg by mouth every 6 (six) hours as needed., Disp: , Rfl:  .  meloxicam (MOBIC) 15 MG tablet, Take 1 tablet (15 mg total) by mouth daily., Disp: 30 tablet, Rfl: 1 .  minocycline (MINOCIN) 100 MG capsule, minocycline 100 mg capsule, Disp: , Rfl:    Review of Systems:   ROS  Negative unless otherwise specified per HPI.  Vitals:   Vitals:   08/30/19 0909  BP: 140/90  Pulse: 67  Temp: 97.7 F (36.5 C)  TempSrc: Temporal  SpO2: 96%  Weight: 231 lb 6.1 oz (105 kg)  Height: 5\' 8"  (1.727 m)      Body mass index is 35.18 kg/m.  Physical Exam:   Physical Exam Vitals and nursing note reviewed.  Constitutional:      General: He is not in acute distress.    Appearance: He is well-developed. He is not ill-appearing or toxic-appearing.  Cardiovascular:     Rate and Rhythm: Normal rate and regular rhythm.     Pulses: Normal pulses.     Heart sounds: Normal heart sounds, S1 normal and S2 normal.     Comments: No LE edema Pulmonary:     Effort: Pulmonary effort is normal.     Breath sounds: Normal breath sounds.  Musculoskeletal:     Comments: Shoulder exam: No deformity of shoulders on inspection. No pain with palpation of shoulder landmarks. FROM in abduction and forward flexion.  Neck exam:  Free range of motion and no bony tenderness.  Skin:    General: Skin is warm and dry.     Comments: Darkened callused plaque to outer right lower leg without evidence of discharge, no TTP, approximately the size of a dime  Neurological:     Mental Status: He is alert.     GCS: GCS eye subscore is 4. GCS verbal subscore is 5. GCS motor subscore is 6.  Psychiatric:        Speech: Speech normal.         Behavior: Behavior normal. Behavior is cooperative.       Assessment and Plan:   Troy Arellano was seen today for establish care, shoulder pain and recurrent skin infections.  Diagnoses and all orders for this visit:  Chronic right shoulder pain We will update x-ray today.  I have refilled  his meloxicam and recommended that he not take this with ibuprofen.  Likely referral to physical therapy, patient is agreeable.  Screening for HIV (human immunodeficiency virus) -     HIV Antibody (routine testing w rflx)  Obesity, unspecified classification, unspecified obesity type, unspecified whether serious comorbidity present We will update labs and determine further need for intervention, recommend he follow-up with me in 1 month to discuss this. -     Hemoglobin A1c  Essential hypertension Overall controlled when he is on this medication.  Refill 25 mg hydrochlorothiazide x1 year.  Update labs today. -     CBC with Differential/Platelet -     Comprehensive metabolic panel  Encounter for lipid screening for cardiovascular disease -     Lipid panel  Special screening for malignant neoplasms, colon -     Ambulatory referral to Gastroenterology  Skin irritation Unclear etiology. I recommend that he follow-up with dermatology regarding this, as he consistently sees them for his keloids.  Patient is agreeable to plan.  Other orders -     hydrochlorothiazide (HYDRODIURIL) 25 MG tablet; Take 1 tablet (25 mg total) by mouth daily. -     meloxicam (MOBIC) 15 MG tablet; Take 1 tablet (15 mg total) by mouth daily.    . Reviewed expectations re: course of current medical issues. . Discussed self-management of symptoms. . Outlined signs and symptoms indicating need for more acute intervention. . Patient verbalized understanding and all questions were answered. . See orders for this visit as documented in the electronic medical record. . Patient received an After-Visit Summary.  CMA or LPN  served as scribe during this visit. History, Physical, and Plan performed by medical provider. The above documentation has been reviewed and is accurate and complete.   Troy Coke, PA-C

## 2019-08-31 ENCOUNTER — Other Ambulatory Visit: Payer: Self-pay | Admitting: Physician Assistant

## 2019-08-31 ENCOUNTER — Telehealth: Payer: Self-pay | Admitting: Physician Assistant

## 2019-08-31 DIAGNOSIS — G8929 Other chronic pain: Secondary | ICD-10-CM

## 2019-08-31 LAB — HIV ANTIBODY (ROUTINE TESTING W REFLEX): HIV 1&2 Ab, 4th Generation: NONREACTIVE

## 2019-08-31 NOTE — Telephone Encounter (Signed)
If he calls back, he just needs to be scheduled for PT.

## 2019-09-02 ENCOUNTER — Other Ambulatory Visit: Payer: Self-pay

## 2019-09-02 DIAGNOSIS — Z13220 Encounter for screening for lipoid disorders: Secondary | ICD-10-CM

## 2019-09-02 DIAGNOSIS — R7303 Prediabetes: Secondary | ICD-10-CM

## 2019-09-02 MED ORDER — METFORMIN HCL 500 MG PO TABS: 500.0000 mg | ORAL_TABLET | Freq: Every day | ORAL | 1 refills | Status: DC

## 2019-09-02 MED ORDER — ATORVASTATIN CALCIUM 20 MG PO TABS: 20.0000 mg | ORAL_TABLET | Freq: Every day | ORAL | 1 refills | Status: DC

## 2019-09-06 ENCOUNTER — Encounter: Payer: Self-pay | Admitting: Physician Assistant

## 2019-09-06 ENCOUNTER — Telehealth (INDEPENDENT_AMBULATORY_CARE_PROVIDER_SITE_OTHER): Payer: BC Managed Care – PPO | Admitting: Physician Assistant

## 2019-09-06 ENCOUNTER — Telehealth: Payer: Self-pay

## 2019-09-06 ENCOUNTER — Other Ambulatory Visit: Payer: Self-pay

## 2019-09-06 ENCOUNTER — Ambulatory Visit: Payer: BC Managed Care – PPO | Admitting: Physical Therapy

## 2019-09-06 DIAGNOSIS — N522 Drug-induced erectile dysfunction: Secondary | ICD-10-CM

## 2019-09-06 MED ORDER — TADALAFIL 20 MG PO TABS: 10.0000 mg | ORAL_TABLET | ORAL | 11 refills | Status: DC | PRN

## 2019-09-06 NOTE — Telephone Encounter (Signed)
Please call patient to schedule an appointment with Aldona Bar to follow up with symptoms on new medications.

## 2019-09-06 NOTE — Progress Notes (Signed)
TELEPHONE ENCOUNTER   Patient verbally agreed to telephone visit and is aware that copayment and coinsurance may apply. Patient was treated using telemedicine according to accepted telemedicine protocols.  Location of the patient: home Location of provider: [place], [employer] Names of all persons participating in the telemedicine service and role in the encounter: [name], [place] , [name]  Subjective:   Chief Complaint  Patient presents with  . Erectile Dysfunction     HPI   Erectile dysfunction Pt c/o erectile issue since starting on medications Metformin and Lipitor. He started both medications at the same time. He started both medications this weekend. He tried to have sexual intercourse this weekend but could not get an erection. He has never had issues with this in the past.  Patient Active Problem List   Diagnosis Date Noted  . Chronic right shoulder pain 08/30/2019  . Obesity 08/30/2019  . Essential hypertension 08/30/2019   Social History   Tobacco Use  . Smoking status: Never Smoker  . Smokeless tobacco: Never Used  Substance Use Topics  . Alcohol use: Yes    Comment: 2-3 drinks a week    Current Outpatient Medications:  .  atorvastatin (LIPITOR) 20 MG tablet, Take 1 tablet (20 mg total) by mouth daily., Disp: 90 tablet, Rfl: 1 .  doxycycline (VIBRAMYCIN) 100 MG capsule, Take 100 mg by mouth daily., Disp: , Rfl:  .  hydrochlorothiazide (HYDRODIURIL) 25 MG tablet, Take 1 tablet (25 mg total) by mouth daily., Disp: 90 tablet, Rfl: 3 .  ibuprofen (ADVIL) 200 MG tablet, Take 200 mg by mouth every 6 (six) hours as needed., Disp: , Rfl:  .  meloxicam (MOBIC) 15 MG tablet, Take 1 tablet (15 mg total) by mouth daily., Disp: 30 tablet, Rfl: 1 .  metFORMIN (GLUCOPHAGE) 500 MG tablet, Take 1 tablet (500 mg total) by mouth daily with breakfast., Disp: 90 tablet, Rfl: 1 .  tretinoin (RETIN-A) 0.025 % cream, APPLY A PEA SIZE AMOUNT TO FACE ONCE NIGHTLY, Disp:  , Rfl:  .  tadalafil (CIALIS) 20 MG tablet, Take 0.5-1 tablets (10-20 mg total) by mouth every other day as needed for erectile dysfunction., Disp: 5 tablet, Rfl: 11 No Known Allergies  Assessment & Plan:   1. Drug-induced erectile dysfunction   Will have patient stop both medications at this time. Cialis script sent for prn use. After stopping Lipitor and Metformin, will have him monitor his symptoms. If ED issues resolve, will have patient resume just one of the medications, most likely metformin, so we can figure out what is going on.  No orders of the defined types were placed in this encounter.  Meds ordered this encounter  Medications  . DISCONTD: tadalafil (CIALIS) 20 MG tablet    Sig: Take 0.5-1 tablets (10-20 mg total) by mouth every other day as needed for erectile dysfunction.    Dispense:  5 tablet    Refill:  11    Order Specific Question:   Supervising Provider    Answer:   Vivi Barrack [id number]  . tadalafil (CIALIS) 20 MG tablet    Sig: Take 0.5-1 tablets (10-20 mg total) by mouth every other day as needed for erectile dysfunction.    Dispense:  5 tablet    Refill:  11    Order Specific Question:   Supervising Provider    Answer:   Maryruth Eve    [name], PA 09/06/2019  Time spent with the patient: 6 minutes,  spent in obtaining information about his symptoms, reviewing his previous labs, evaluations, and treatments, counseling him about his condition (please see the discussed topics above), and developing a plan to further investigate it; he had a number of questions which I addressed.

## 2019-09-27 ENCOUNTER — Encounter: Payer: Self-pay | Admitting: Physician Assistant

## 2019-10-11 ENCOUNTER — Encounter: Payer: Self-pay | Admitting: Physician Assistant

## 2019-10-11 ENCOUNTER — Ambulatory Visit: Payer: BC Managed Care – PPO | Admitting: Physician Assistant

## 2019-10-11 ENCOUNTER — Other Ambulatory Visit: Payer: Self-pay

## 2019-10-11 VITALS — BP 122/60 | HR 74 | Temp 97.4°F | Resp 18 | Ht 68.0 in | Wt 226.2 lb

## 2019-10-11 DIAGNOSIS — E669 Obesity, unspecified: Secondary | ICD-10-CM | POA: Diagnosis not present

## 2019-10-11 DIAGNOSIS — E785 Hyperlipidemia, unspecified: Secondary | ICD-10-CM

## 2019-10-11 DIAGNOSIS — N522 Drug-induced erectile dysfunction: Secondary | ICD-10-CM | POA: Diagnosis not present

## 2019-10-11 DIAGNOSIS — E8881 Metabolic syndrome: Secondary | ICD-10-CM | POA: Diagnosis not present

## 2019-10-11 MED ORDER — MELOXICAM 15 MG PO TABS: 15.0000 mg | ORAL_TABLET | Freq: Every day | ORAL | 1 refills | Status: DC

## 2019-10-11 NOTE — Progress Notes (Signed)
Troy Arellano is a 54 y.o. male here for a follow up of a pre-existing problem.  History of Present Illness:   Chief Complaint  Patient presents with   Weight Loss   Health Maintenance    Will schedule an appt to get his colonoscopy done.     HPI   Erectile dysfunction; HLD; insulin resistance -- he was put on metformin and lipitor at his last visit on 4/26. He was given cialis to use prn. We also discussed stopping both medications which he did. He used cialis briefly but in the past two weeks has not had to use it and denies any current issues. He is reluctant to restart medications due to these above side effects.  Wt Readings from Last 5 Encounters:  10/11/19 226 lb 3.2 oz (102.6 kg)  08/30/19 231 lb 6.1 oz (105 kg)  04/12/17 210 lb (95.3 kg)  Body mass index is 34.39 kg/m.  Obesity -- walking regularly.. Reduced bread consumption. Drinking Gatorade No Sugar, water. Has been limiting alcohol -- 2 glasses on the weekend. Eating a trail mix regularly for snacking. Joined gym and starting on Thursday.  The 10-year ASCVD risk score Mikey Bussing DC Brooke Bonito., et al., 2013) is: 10.1%   Values used to calculate the score:     Age: 49 years     Sex: Male     Is Non-Hispanic African American: Yes     Diabetic: No     Tobacco smoker: No     Systolic Blood Pressure: 482 mmHg     Is BP treated: Yes     HDL Cholesterol: 44.2 mg/dL     Total Cholesterol: 239 mg/dL    Past Medical History:  Diagnosis Date   Arthritis    Back   History of chicken pox    Hypertension      Social History   Tobacco Use   Smoking status: Never Smoker   Smokeless tobacco: Never Used  Substance Use Topics   Alcohol use: Yes    Comment: 2-3 drinks a week   Drug use: No    Past Surgical History:  Procedure Laterality Date   HERNIA REPAIR Right 1987    Family History  Problem Relation Age of Onset   Hypertension Mother    Hypertension Father    Lymphoma Sister     No Known  Allergies  Current Medications:   Current Outpatient Medications:    doxycycline (VIBRAMYCIN) 100 MG capsule, Take 100 mg by mouth daily., Disp: , Rfl:    hydrochlorothiazide (HYDRODIURIL) 25 MG tablet, Take 1 tablet (25 mg total) by mouth daily., Disp: 90 tablet, Rfl: 3   ibuprofen (ADVIL) 200 MG tablet, Take 200 mg by mouth every 6 (six) hours as needed., Disp: , Rfl:    meloxicam (MOBIC) 15 MG tablet, Take 1 tablet (15 mg total) by mouth daily., Disp: 30 tablet, Rfl: 1   metFORMIN (GLUCOPHAGE) 500 MG tablet, Take 1 tablet (500 mg total) by mouth daily with breakfast., Disp: 90 tablet, Rfl: 1   tadalafil (CIALIS) 20 MG tablet, Take 0.5-1 tablets (10-20 mg total) by mouth every other day as needed for erectile dysfunction., Disp: 5 tablet, Rfl: 11   tretinoin (RETIN-A) 0.025 % cream, APPLY A PEA SIZE AMOUNT TO FACE ONCE NIGHTLY, Disp: , Rfl:    atorvastatin (LIPITOR) 20 MG tablet, Take 1 tablet (20 mg total) by mouth daily. (Patient not taking: Reported on 10/11/2019), Disp: 90 tablet, Rfl: 1   Review of Systems:  ROS Negative unless otherwise specified per HPI.  Vitals:   Vitals:   10/11/19 0859  BP: 122/60  Pulse: 74  Resp: 18  Temp: (!) 97.4 F (36.3 C)  TempSrc: Temporal  SpO2: 96%  Weight: 226 lb 3.2 oz (102.6 kg)  Height: 5\' 8"  (1.727 m)     Body mass index is 34.39 kg/m.  Physical Exam:   Physical Exam Vitals and nursing note reviewed.  Constitutional:      General: He is not in acute distress.    Appearance: He is well-developed. He is not ill-appearing or toxic-appearing.  Cardiovascular:     Rate and Rhythm: Normal rate and regular rhythm.     Pulses: Normal pulses.     Heart sounds: Normal heart sounds, S1 normal and S2 normal.     Comments: No LE edema Pulmonary:     Effort: Pulmonary effort is normal.     Breath sounds: Normal breath sounds.  Skin:    General: Skin is warm and dry.  Neurological:     Mental Status: He is alert.     GCS: GCS  eye subscore is 4. GCS verbal subscore is 5. GCS motor subscore is 6.  Psychiatric:        Speech: Speech normal.        Behavior: Behavior normal. Behavior is cooperative.     Assessment and Plan:   Jerrett was seen today for weight loss and health maintenance.  Diagnoses and all orders for this visit:  Obesity, unspecified classification, unspecified obesity type, unspecified whether serious comorbidity present Continue to work on diet and exercise -- patient is very motivated.  Drug-induced erectile dysfunction Resolved per patient with stopping metformin and lipitor. Denies need for urology referral at this time.  Hyperlipidemia, unspecified hyperlipidemia type Declines restarting statin. Current ASCVD is 10.1% -- encouraged patient to start daily ASA, which he is agreeable to.  Insulin resistance Declines restarting metformin. He is motivated to continue to lose weight.  Other orders -     meloxicam (MOBIC) 15 MG tablet; Take 1 tablet (15 mg total) by mouth daily.   Reviewed expectations re: course of current medical issues.  Discussed self-management of symptoms.  Outlined signs and symptoms indicating need for more acute intervention.  Patient verbalized understanding and all questions were answered.  See orders for this visit as documented in the electronic medical record.  Patient received an After-Visit Summary.  [name], PA-C

## 2019-10-11 NOTE — Patient Instructions (Addendum)
It was great to see you!  Start daily low dose aspirin of 81 mg.  I will see you in two months when you weigh 215 lb (OR LESS!)  Take care,  [name] PA-C

## 2020-03-02 ENCOUNTER — Other Ambulatory Visit: Payer: Self-pay | Admitting: Family Medicine

## 2020-03-02 DIAGNOSIS — Z13220 Encounter for screening for lipoid disorders: Secondary | ICD-10-CM

## 2020-03-02 DIAGNOSIS — R7303 Prediabetes: Secondary | ICD-10-CM

## 2020-05-06 DIAGNOSIS — Z8616 Personal history of COVID-19: Secondary | ICD-10-CM

## 2020-05-06 HISTORY — DX: Personal history of COVID-19: Z86.16

## 2020-05-12 ENCOUNTER — Encounter: Payer: Self-pay | Admitting: Physician Assistant

## 2020-05-12 ENCOUNTER — Telehealth (INDEPENDENT_AMBULATORY_CARE_PROVIDER_SITE_OTHER): Payer: BC Managed Care – PPO | Admitting: Physician Assistant

## 2020-05-12 DIAGNOSIS — U071 COVID-19: Secondary | ICD-10-CM | POA: Diagnosis not present

## 2020-05-12 MED ORDER — BENZONATATE 100 MG PO CAPS: 100.0000 mg | ORAL_CAPSULE | Freq: Three times a day (TID) | ORAL | 1 refills | Status: DC | PRN

## 2020-05-12 MED ORDER — FLUTICASONE PROPIONATE 50 MCG/ACT NA SUSP: 2.0000 | Freq: Every day | NASAL | 2 refills | Status: DC

## 2020-05-12 NOTE — Progress Notes (Signed)
Virtual Visit via Video   I connected with Troy Arellano on 05/12/20 at 10:30 AM EST by a video enabled telemedicine application and verified that I am speaking with the correct person using two identifiers. Location patient: Home Location provider: [facility], Office Persons participating in the virtual visit: Troy Arellano, Troy Coke PA-C  I discussed the limitations of evaluation and management by telemedicine and the availability of in person appointments. The patient expressed understanding and agreed to proceed.   Subjective:   HPI:   Patient is requesting evaluation for possible COVID-19.  Symptom onset: 05/10/20  Travel/contacts: positive exposure at work   Vaccination status: has received two doses of Pfizer  Tested positive for COVID-19 yesterday 05/11/20  Patient endorses the following symptoms: sore throat and productive cough  Patient denies the following symptoms: subjective fever, sinus congestion, ear pain, difficulty swallowing, wheezing, shortness of breath, chest tightness, chest pain and myalgia  Treatments tried: none  Patient risk factors: Current PNTIR-44 risk of complications score: 3 Smoking status: Troy Arellano  reports that he has never smoked. He has never used smokeless tobacco. If male, currently pregnant? []   Yes [x]   No  ROS: See pertinent positives and negatives per HPI.  Patient Active Problem List   Diagnosis Date Noted  . Hyperlipidemia 10/11/2019  . Insulin resistance 10/11/2019  . Chronic right shoulder pain 08/30/2019  . Obesity 08/30/2019  . Essential hypertension 08/30/2019    Social History   Tobacco Use  . Smoking status: Never Smoker  . Smokeless tobacco: Never Used  Substance Use Topics  . Alcohol use: Yes    Comment: 2-3 drinks a week    Current Outpatient Medications:  .  benzonatate (TESSALON PERLES) 100 MG capsule, Take 1 capsule (100 mg total) by mouth 3 (three) times daily as needed for cough., Disp: 30  capsule, Rfl: 1 .  fluticasone (FLONASE) 50 MCG/ACT nasal spray, Place 2 sprays into both nostrils daily., Disp: 16 g, Rfl: 2 .  atorvastatin (LIPITOR) 20 MG tablet, TAKE 1 TABLET BY MOUTH EVERY DAY, Disp: 90 tablet, Rfl: 1 .  doxycycline (VIBRAMYCIN) 100 MG capsule, Take 100 mg by mouth daily., Disp: , Rfl:  .  hydrochlorothiazide (HYDRODIURIL) 25 MG tablet, Take 1 tablet (25 mg total) by mouth daily., Disp: 90 tablet, Rfl: 3 .  ibuprofen (ADVIL) 200 MG tablet, Take 200 mg by mouth every 6 (six) hours as needed., Disp: , Rfl:  .  meloxicam (MOBIC) 15 MG tablet, Take 1 tablet (15 mg total) by mouth daily., Disp: 30 tablet, Rfl: 1 .  metFORMIN (GLUCOPHAGE) 500 MG tablet, TAKE 1 TABLET BY MOUTH EVERY DAY WITH BREAKFAST, Disp: 90 tablet, Rfl: 1 .  tadalafil (CIALIS) 20 MG tablet, Take 0.5-1 tablets (10-20 mg total) by mouth every other day as needed for erectile dysfunction., Disp: 5 tablet, Rfl: 11 .  tretinoin (RETIN-A) 0.025 % cream, APPLY A PEA SIZE AMOUNT TO FACE ONCE NIGHTLY, Disp: , Rfl:   No Known Allergies  Objective:   VITALS: Per patient if applicable, see vitals. GENERAL: Alert, appears well and in no acute distress. HEENT: Atraumatic, conjunctiva clear, no obvious abnormalities on inspection of external nose and ears. NECK: Normal movements of the head and neck. CARDIOPULMONARY: No increased WOB. Speaking in clear sentences. I:E ratio WNL.  MS: Moves all visible extremities without noticeable abnormality. PSYCH: Pleasant and cooperative, well-groomed. Speech normal rate and rhythm. Affect is appropriate. Insight and judgement are appropriate. Attention is focused, linear, and appropriate.  NEURO: CN grossly intact. Oriented as arrived to appointment on time with no prompting. Moves both UE equally.  SKIN: No obvious lesions, wounds, erythema, or cyanosis noted on face or hands.  Assessment and Plan:   Diagnoses and all orders for this visit:  COVID-19  Other orders -      benzonatate (TESSALON PERLES) 100 MG capsule; Take 1 capsule (100 mg total) by mouth 3 (three) times daily as needed for cough. -     fluticasone (FLONASE) 50 MCG/ACT nasal spray; Place 2 sprays into both nostrils daily.    No red flags on discussion, patient is not in any obvious distress during our visit. Discussed progression of most viral illness, and recommended supportive care at this point in time. I prescribed tessalon perles and flonase for patient. Recommended close monitoring of symptoms.  Discussed over the counter supportive care options, with recommendations to push fluids and rest. Reviewed return precautions including new/worsening fever, SOB, new/worsening cough or other concerns.  Recommended need to self-quarantine and practice social distancing until symptoms resolve.  I recommend that patient follow-up if symptoms worsen or persist despite treatment x 7-10 days, sooner if needed.  I discussed the assessment and treatment plan with the patient. The patient was provided an opportunity to ask questions and all were answered. The patient agreed with the plan and demonstrated an understanding of the instructions.   The patient was advised to call back or seek an in-person evaluation if the symptoms worsen or if the condition fails to improve as anticipated.   CMA or LPN served as scribe during this visit. History, Physical, and Plan performed by medical provider. The above documentation has been reviewed and is accurate and complete.  [place], [place] 05/12/2020

## 2020-08-08 ENCOUNTER — Other Ambulatory Visit: Payer: Self-pay | Admitting: Physician Assistant

## 2020-08-09 ENCOUNTER — Encounter: Payer: Self-pay | Admitting: Physician Assistant

## 2020-08-09 ENCOUNTER — Other Ambulatory Visit: Payer: Self-pay

## 2020-08-09 ENCOUNTER — Telehealth (INDEPENDENT_AMBULATORY_CARE_PROVIDER_SITE_OTHER): Payer: BC Managed Care – PPO | Admitting: Physician Assistant

## 2020-08-09 VITALS — Ht 68.0 in | Wt 226.2 lb

## 2020-08-09 DIAGNOSIS — F4321 Adjustment disorder with depressed mood: Secondary | ICD-10-CM

## 2020-08-09 DIAGNOSIS — F5109 Other insomnia not due to a substance or known physiological condition: Secondary | ICD-10-CM

## 2020-08-09 MED ORDER — LORAZEPAM 0.5 MG PO TABS: 0.5000 mg | ORAL_TABLET | Freq: Every evening | ORAL | 0 refills | Status: DC | PRN

## 2020-08-09 NOTE — Progress Notes (Signed)
Virtual Visit via Video   I connected with Troy Arellano on 08/09/20 at  4:00 PM EDT by a video enabled telemedicine application and verified that I am speaking with the correct person using two identifiers. Location patient: Home Location provider: [facility], Office Persons participating in the virtual visit: Troy Arellano, Troy Coke PA-C  I discussed the limitations of evaluation and management by telemedicine and the availability of in person appointments. The patient expressed understanding and agreed to proceed.   Subjective:   HPI:   Depression and Insomnia Father passed away on 22-Jun-2022. He was also quite sick in January and patient had to spent a significant time going back and forth from the hospital. He did not take any time off when his father passed and has not had ample time to grieve. He is not sleeping well. Not performing well at work. Going to church and talking to spouse some (her mom is currently dying).  Denies SI/HI. No prior hx of drug or etoh abuse.  Depression screen [facility] 2/9 08/09/2020 08/30/2019  Decreased Interest 3 0  Down, Depressed, Hopeless 3 0  PHQ - 2 Score 6 0  Altered sleeping 3 -  Tired, decreased energy 3 -  Change in appetite 3 -  Feeling bad or failure about yourself  3 -  Trouble concentrating 3 -  Moving slowly or fidgety/restless 3 -  Suicidal thoughts 0 -  PHQ-9 Score 24 -  Difficult doing work/chores Very difficult -    ROS: See pertinent positives and negatives per HPI.  Patient Active Problem List   Diagnosis Date Noted  . Hyperlipidemia 10/11/2019  . Insulin resistance 10/11/2019  . Chronic right shoulder pain 08/30/2019  . Obesity 08/30/2019  . Essential hypertension 08/30/2019    Social History   Tobacco Use  . Smoking status: Never Smoker  . Smokeless tobacco: Never Used  Substance Use Topics  . Alcohol use: Yes    Comment: 2-3 drinks a week    Current Outpatient Medications:  .  hydrochlorothiazide  (HYDRODIURIL) 25 MG tablet, Take 1 tablet (25 mg total) by mouth daily., Disp: 90 tablet, Rfl: 3 .  ibuprofen (ADVIL) 200 MG tablet, Take 200 mg by mouth every 6 (six) hours as needed., Disp: , Rfl:  .  LORazepam (ATIVAN) 0.5 MG tablet, Take 1 tablet (0.5 mg total) by mouth at bedtime as needed for anxiety or sleep., Disp: 12 tablet, Rfl: 0 .  tadalafil (CIALIS) 20 MG tablet, Take 0.5-1 tablets (10-20 mg total) by mouth every other day as needed for erectile dysfunction., Disp: 5 tablet, Rfl: 11 .  atorvastatin (LIPITOR) 20 MG tablet, TAKE 1 TABLET BY MOUTH EVERY DAY (Patient not taking: Reported on 08/09/2020), Disp: 90 tablet, Rfl: 1 .  benzonatate (TESSALON PERLES) 100 MG capsule, Take 1 capsule (100 mg total) by mouth 3 (three) times daily as needed for cough. (Patient not taking: Reported on 08/09/2020), Disp: 30 capsule, Rfl: 1 .  doxycycline (VIBRAMYCIN) 100 MG capsule, Take 100 mg by mouth daily. (Patient not taking: Reported on 08/09/2020), Disp: , Rfl:  .  fluticasone (FLONASE) 50 MCG/ACT nasal spray, SPRAY 2 SPRAYS INTO EACH NOSTRIL EVERY DAY (Patient not taking: Reported on 08/09/2020), Disp: 48 mL, Rfl: 1 .  meloxicam (MOBIC) 15 MG tablet, Take 1 tablet (15 mg total) by mouth daily. (Patient not taking: Reported on 08/09/2020), Disp: 30 tablet, Rfl: 1 .  metFORMIN (GLUCOPHAGE) 500 MG tablet, TAKE 1 TABLET BY MOUTH EVERY DAY WITH BREAKFAST (  Patient not taking: Reported on 08/09/2020), Disp: 90 tablet, Rfl: 1 .  tretinoin (RETIN-A) 0.025 % cream, APPLY A PEA SIZE AMOUNT TO FACE ONCE NIGHTLY (Patient not taking: Reported on 08/09/2020), Disp: , Rfl:   No Known Allergies  Objective:   VITALS: Per patient if applicable, see vitals. GENERAL: Alert, appears well and in no acute distress. HEENT: Atraumatic, conjunctiva clear, no obvious abnormalities on inspection of external nose and ears. NECK: Normal movements of the head and neck. CARDIOPULMONARY: No increased WOB. Speaking in clear sentences. I:E  ratio WNL.  MS: Moves all visible extremities without noticeable abnormality. PSYCH: Pleasant and cooperative, well-groomed. Speech normal rate and rhythm. Affect is appropriate. Insight and judgement are appropriate. Attention is focused, linear, and appropriate.  NEURO: CN grossly intact. Oriented as arrived to appointment on time with no prompting. Moves both UE equally.  SKIN: No obvious lesions, wounds, erythema, or cyanosis noted on face or hands.  Assessment and Plan:   Troy Arellano was seen today for depression and insomnia.  Diagnoses and all orders for this visit:  Grieving; Situational insomnia Uncontrolled. He is requesting to have time off work to adequately grieve. I think that this is completely reasonable. I have provided work note. Ativan 0.5 mg prn given to help with sleep and anxiety. If needs further time off, I recommend scheduling follow-up appointment with me. Continue to lean on church for support. I discussed with patient that if they develop any SI, to tell someone immediately and seek medical attention.   Other orders -     LORazepam (ATIVAN) 0.5 MG tablet; Take 1 tablet (0.5 mg total) by mouth at bedtime as needed for anxiety or sleep.    I discussed the assessment and treatment plan with the patient. The patient was provided an opportunity to ask questions and all were answered. The patient agreed with the plan and demonstrated an understanding of the instructions.   The patient was advised to call back or seek an in-person evaluation if the symptoms worsen or if the condition fails to improve as anticipated.    [place], [place] 08/09/2020

## 2020-08-16 ENCOUNTER — Encounter: Payer: Self-pay | Admitting: Physician Assistant

## 2020-08-16 ENCOUNTER — Telehealth: Payer: Self-pay

## 2020-08-16 NOTE — Telephone Encounter (Signed)
Patient called in and stated he needed his note to be extended to 08/23/20 to go back to work since he needs a CPE before returning and he is scheduled for 08/22/20. Patient would like a call back.

## 2020-08-16 NOTE — Telephone Encounter (Signed)
Tried to contact pt mailbox is full unable to leave message. Note done for work in his My Chart.

## 2020-08-16 NOTE — Telephone Encounter (Signed)
My chart message sent to pt about work note.

## 2020-08-22 ENCOUNTER — Ambulatory Visit (INDEPENDENT_AMBULATORY_CARE_PROVIDER_SITE_OTHER): Payer: BC Managed Care – PPO | Admitting: Physician Assistant

## 2020-08-22 ENCOUNTER — Encounter: Payer: Self-pay | Admitting: Physician Assistant

## 2020-08-22 ENCOUNTER — Other Ambulatory Visit: Payer: Self-pay

## 2020-08-22 VITALS — BP 134/86 | HR 85 | Temp 95.3°F | Ht 67.0 in | Wt 229.2 lb

## 2020-08-22 DIAGNOSIS — Z1159 Encounter for screening for other viral diseases: Secondary | ICD-10-CM

## 2020-08-22 DIAGNOSIS — E785 Hyperlipidemia, unspecified: Secondary | ICD-10-CM | POA: Diagnosis not present

## 2020-08-22 DIAGNOSIS — F5109 Other insomnia not due to a substance or known physiological condition: Secondary | ICD-10-CM

## 2020-08-22 DIAGNOSIS — F4321 Adjustment disorder with depressed mood: Secondary | ICD-10-CM

## 2020-08-22 DIAGNOSIS — E8881 Metabolic syndrome: Secondary | ICD-10-CM

## 2020-08-22 DIAGNOSIS — Z0000 Encounter for general adult medical examination without abnormal findings: Secondary | ICD-10-CM

## 2020-08-22 DIAGNOSIS — I10 Essential (primary) hypertension: Secondary | ICD-10-CM | POA: Diagnosis not present

## 2020-08-22 DIAGNOSIS — E669 Obesity, unspecified: Secondary | ICD-10-CM

## 2020-08-22 DIAGNOSIS — Z1211 Encounter for screening for malignant neoplasm of colon: Secondary | ICD-10-CM

## 2020-08-22 LAB — COMPREHENSIVE METABOLIC PANEL
ALT: 35 U/L (ref 0–53)
AST: 26 U/L (ref 0–37)
Albumin: 4.1 g/dL (ref 3.5–5.2)
Alkaline Phosphatase: 63 U/L (ref 39–117)
BUN: 18 mg/dL (ref 6–23)
CO2: 28 mEq/L (ref 19–32)
Calcium: 9.9 mg/dL (ref 8.4–10.5)
Chloride: 99 mEq/L (ref 96–112)
Creatinine, Ser: 1.06 mg/dL (ref 0.40–1.50)
GFR: 79.79 mL/min (ref >60.00)
Glucose, Bld: 92 mg/dL (ref 70–99)
Potassium: 3.9 mEq/L (ref 3.5–5.1)
Sodium: 135 mEq/L (ref 135–145)
Total Bilirubin: 0.6 mg/dL (ref 0.2–1.2)
Total Protein: 8.3 g/dL (ref 6.0–8.3)

## 2020-08-22 LAB — LIPID PANEL
Cholesterol: 227 mg/dL — ABNORMAL HIGH (ref 0–200)
HDL: 38 mg/dL — ABNORMAL LOW (ref >39.00)
LDL Cholesterol: 152 mg/dL — ABNORMAL HIGH (ref 0–99)
NonHDL: 189.21
Total CHOL/HDL Ratio: 6
Triglycerides: 188 mg/dL — ABNORMAL HIGH (ref 0.0–149.0)
VLDL: 37.6 mg/dL (ref 0.0–40.0)

## 2020-08-22 LAB — CBC WITH DIFFERENTIAL/PLATELET
Basophils Absolute: 0 10*3/uL (ref 0.0–0.1)
Basophils Relative: 0.2 % (ref 0.0–3.0)
Eosinophils Absolute: 0.1 10*3/uL (ref 0.0–0.7)
Eosinophils Relative: 1 % (ref 0.0–5.0)
HCT: 44.7 % (ref 39.0–52.0)
Hemoglobin: 14.5 g/dL (ref 13.0–17.0)
Lymphocytes Relative: 32.2 % (ref 12.0–46.0)
Lymphs Abs: 2 10*3/uL (ref 0.7–4.0)
MCHC: 32.4 g/dL (ref 30.0–36.0)
MCV: 87 fl (ref 78.0–100.0)
Monocytes Absolute: 0.5 10*3/uL (ref 0.1–1.0)
Monocytes Relative: 7.9 % (ref 3.0–12.0)
Neutro Abs: 3.6 10*3/uL (ref 1.4–7.7)
Neutrophils Relative %: 58.7 % (ref 43.0–77.0)
Platelets: 235 10*3/uL (ref 150.0–400.0)
RBC: 5.14 Mil/uL (ref 4.22–5.81)
RDW: 14.5 % (ref 11.5–15.5)
WBC: 6.1 10*3/uL (ref 4.0–10.5)

## 2020-08-22 LAB — HEMOGLOBIN A1C: Hgb A1c MFr Bld: 6.2 % (ref 4.6–6.5)

## 2020-08-22 MED ORDER — FLUTICASONE PROPIONATE 50 MCG/ACT NA SUSP: 2.0000 | Freq: Every day | NASAL | 1 refills | Status: DC

## 2020-08-22 NOTE — Patient Instructions (Signed)
It was great to see you!  For your allergies:  Start daily over the counter antihistamines such as Zyrtec (cetirizine), Claritin (loratadine), Allegra (fexofenadine), or Xyzal (levocetirizine).  Continue Flonase 1 spray twice a day.  Nasal saline spray (i.e., Simply Saline) or nasal saline lavage (i.e., NeilMed) is recommended as needed and prior to medicated nasal sprays.  You will be contacted about your referral for colonoscopy.  We will be in touch with your blood work results and send in more refills at that time.  Please go to the lab for blood work.   Our office will call you with your results unless you have chosen to receive results via MyChart.  If your blood work is normal we will follow-up each year for physicals and as scheduled for chronic medical problems.  If anything is abnormal we will treat accordingly and get you in for a follow-up.  Take care,  [place] Health Maintenance, Male Adopting a healthy lifestyle and getting preventive care are important in promoting health and wellness. Ask your health care provider about:  The right schedule for you to have regular tests and exams.  Things you can do on your own to prevent diseases and keep yourself healthy. What should I know about diet, weight, and exercise? Eat a healthy diet  Eat a diet that includes plenty of vegetables, fruits, low-fat dairy products, and lean protein.  Do not eat a lot of foods that are high in solid fats, added sugars, or sodium.   Maintain a healthy weight Body mass index (BMI) is a measurement that can be used to identify possible weight problems. It estimates body fat based on height and weight. Your health care provider can help determine your BMI and help you achieve or maintain a healthy weight. Get regular exercise Get regular exercise. This is one of the most important things you can do for your health. Most adults should:  Exercise for at least 150 minutes each week.  The exercise should increase your heart rate and make you sweat (moderate-intensity exercise).  Do strengthening exercises at least twice a week. This is in addition to the moderate-intensity exercise.  Spend less time sitting. Even light physical activity can be beneficial. Watch cholesterol and blood lipids Have your blood tested for lipids and cholesterol at 54 years of age, then have this test every 5 years. You may need to have your cholesterol levels checked more often if:  Your lipid or cholesterol levels are high.  You are older than 54 years of age.  You are at high risk for heart disease. What should I know about cancer screening? Many types of cancers can be detected early and may often be prevented. Depending on your health history and family history, you may need to have cancer screening at various ages. This may include screening for:  Colorectal cancer.  Prostate cancer.  Skin cancer.  Lung cancer. What should I know about heart disease, diabetes, and high blood pressure? Blood pressure and heart disease  High blood pressure causes heart disease and increases the risk of stroke. This is more likely to develop in people who have high blood pressure readings, are of African descent, or are overweight.  Talk with your health care provider about your target blood pressure readings.  Have your blood pressure checked: ? Every 3-5 years if you are 36-76 years of age. ? Every year if you are 58 years old or older.  If you are between the ages  of 50 and 75 and are a current or former smoker, ask your health care provider if you should have a one-time screening for abdominal aortic aneurysm (AAA). Diabetes Have regular diabetes screenings. This checks your fasting blood sugar level. Have the screening done:  Once every three years after age 68 if you are at a normal weight and have a low risk for diabetes.  More often and at a younger age if you are overweight or have a  high risk for diabetes. What should I know about preventing infection? Hepatitis B If you have a higher risk for hepatitis B, you should be screened for this virus. Talk with your health care provider to find out if you are at risk for hepatitis B infection. Hepatitis C Blood testing is recommended for:  Everyone born from [birth year] through 1964.  Anyone with known risk factors for hepatitis C. Sexually transmitted infections (STIs)  You should be screened each year for STIs, including gonorrhea and chlamydia, if: ? You are sexually active and are younger than 54 years of age. ? You are older than 54 years of age and your health care provider tells you that you are at risk for this type of infection. ? Your sexual activity has changed since you were last screened, and you are at increased risk for chlamydia or gonorrhea. Ask your health care provider if you are at risk.  Ask your health care provider about whether you are at high risk for HIV. Your health care provider may recommend a prescription medicine to help prevent HIV infection. If you choose to take medicine to prevent HIV, you should first get tested for HIV. You should then be tested every 3 months for as long as you are taking the medicine. Follow these instructions at home: Lifestyle  Do not use any products that contain nicotine or tobacco, such as cigarettes, e-cigarettes, and chewing tobacco. If you need help quitting, ask your health care provider.  Do not use street drugs.  Do not share needles.  Ask your health care provider for help if you need support or information about quitting drugs. Alcohol use  Do not drink alcohol if your health care provider tells you not to drink.  If you drink alcohol: ? Limit how much you have to 0-2 drinks a day. ? Be aware of how much alcohol is in your drink. In the [place] one drink equals one 12 oz bottle of beer (355 mL), one 5 oz glass of wine (148 mL), or one 1 oz glass of hard  liquor (44 mL). General instructions  Schedule regular health, dental, and eye exams.  Stay current with your vaccines.  Tell your health care provider if: ? You often feel depressed. ? You have ever been abused or do not feel safe at home. Summary  Adopting a healthy lifestyle and getting preventive care are important in promoting health and wellness.  Follow your health care provider's instructions about healthy diet, exercising, and getting tested or screened for diseases.  Follow your health care provider's instructions on monitoring your cholesterol and blood pressure. This information is not intended to replace advice given to you by your health care provider. Make sure you discuss any questions you have with your health care provider. Document Revised: 04/15/2018 Document Reviewed: 04/15/2018 Elsevier Patient Education  2021 [employer].

## 2020-08-22 NOTE — Progress Notes (Signed)
Subjective:    Troy Arellano is a 55 y.o. male and is here for a comprehensive physical exam.  HPI  Health Maintenance Due  Topic Date Due  . Hepatitis C Screening  Never done  . COLONOSCOPY (Pts 45-37yrs Insurance coverage will need to be confirmed)  Never done    Acute Concerns: Situational insomnia and grieving -- he has been out of work from 4/7 to today while coping with his father's recent death. He was prescribed ativan, took this a few times but did not feel like it helped him and did not like side effects. He has been walking more and working on better sleep. He feels much improved since our last visit. Denies SI/HI.  Chronic Issues: HTN -- Currently taking HCTZ 25 mg. At home blood pressure readings are: normal, per patient. Patient denies chest pain, SOB, blurred vision, dizziness, unusual headaches, lower leg swelling. Patient is compliant with medication. Denies excessive caffeine intake, stimulant usage, excessive alcohol intake, or increase in salt consumption.  BP Readings from Last 3 Encounters:  08/22/20 134/86  10/11/19 122/60  08/30/19 140/90   HLD -- Currently taking lipitor 20 mg and tolerating well. Denies myalgias.  Insulin resistance -- currently taking metformin 500 mg daily. Tolerating well, denies concerns. He is drinking a lot of sugary beverages -- 2 gingerales daily, 3 lemonades and some sweetened eat. Denies polyuria or polydipsia. Last A1c was 6.3% about 1 year ago.  Health Maintenance: Immunizations -- UTD Colonoscopy -- Overdue, agreeable for referral, no rectal bleeding, family history of colon cancer PSA -- N/A Diet -- eats all food groups, drinks significant amount of sugar Sleep habits -- overall okay Exercise -- walking while off from work recently; now has treadmill that he is going to start trying to use; denies regular strength training Weight -- Weight: 229 lb 3.2 oz (104 kg)  Weight history Wt Readings from Last 10 Encounters:   08/22/20 229 lb 3.2 oz (104 kg)  08/09/20 226 lb 3.1 oz (102.6 kg)  10/11/19 226 lb 3.2 oz (102.6 kg)  08/30/19 231 lb 6.1 oz (105 kg)  04/12/17 210 lb (95.3 kg)   Body mass index is 35.9 kg/m. Mood -- improved Tobacco use --  Tobacco Use: Low Risk   . Smoking Tobacco Use: Never Smoker  . Smokeless Tobacco Use: Never Used    Alcohol use ---  reports current alcohol use.   Depression screen [facility] 2/9 08/22/2020  Decreased Interest 0  Down, Depressed, Hopeless 0  PHQ - 2 Score 0  Altered sleeping 0  Tired, decreased energy 0  Change in appetite 0  Feeling bad or failure about yourself  0  Trouble concentrating 0  Moving slowly or fidgety/restless 0  Suicidal thoughts 0  PHQ-9 Score 0  Difficult doing work/chores Not difficult at all   UTD with dentist and eye doc per patient  Other providers/specialists: Patient Care Team: [name], [place] as PCP - General (Physician Assistant)   PMHx, SurgHx, SocialHx, Medications, and Allergies were reviewed in the Visit Navigator and updated as appropriate.   Past Medical History:  Diagnosis Date  . Arthritis    Back  . History of chicken pox   . Hypertension      Past Surgical History:  Procedure Laterality Date  . HERNIA REPAIR Right 1987     Family History  Problem Relation Age of Onset  . Hypertension Mother   . Hypertension Father   . Heart failure Father   .  Lymphoma Sister     Social History   Tobacco Use  . Smoking status: Never Smoker  . Smokeless tobacco: Never Used  Vaping Use  . Vaping Use: Never used  Substance Use Topics  . Alcohol use: Yes    Comment: 2-3 drinks a week  . Drug use: No    Review of Systems:   Review of Systems  Constitutional: Negative for chills, fever, malaise/fatigue and weight loss.  HENT: Negative for hearing loss, sinus pain and sore throat.   Respiratory: Negative for cough and hemoptysis.   Cardiovascular: Negative for chest pain, palpitations, leg swelling and  PND.  Gastrointestinal: Negative for abdominal pain, constipation, diarrhea, heartburn, nausea and vomiting.  Genitourinary: Negative for dysuria, frequency and urgency.  Musculoskeletal: Negative for back pain, myalgias and neck pain.  Skin: Negative for itching and rash.  Neurological: Negative for dizziness, tingling, seizures and headaches.  Endo/Heme/Allergies: Negative for polydipsia.  Psychiatric/Behavioral: Negative for depression. The patient is not nervous/anxious.     Objective:   Vitals:   08/22/20 1306  BP: 134/86  Pulse: 85  Temp: (!) 95.3 F (35.2 C)  SpO2: 97%   Body mass index is 35.9 kg/m.  General Appearance:  Alert, cooperative, no distress, appears stated age  Head:  Normocephalic, without obvious abnormality, atraumatic  Eyes:  PERRL, conjunctiva/corneas clear, EOM's intact, fundi benign, both eyes       Ears:  Normal TM's and external ear canals, both ears  Nose: Nares normal, septum midline, mucosa normal, no drainage    or sinus tenderness  Throat: Lips, mucosa, and tongue normal; teeth and gums normal  Neck: Supple, symmetrical, trachea midline, no adenopathy; thyroid:  No enlargement/tenderness/nodules; no carotit bruit or JVD  Back:   Symmetric, no curvature, ROM normal, no CVA tenderness  Lungs:   Clear to auscultation bilaterally, respirations unlabored  Chest wall:  No tenderness or deformity  Heart:  Regular rate and rhythm, S1 and S2 normal, no murmur, rub   or gallop  Abdomen:   Soft, non-tender, bowel sounds active all four quadrants, no masses, no organomegaly  Extremities: Extremities normal, atraumatic, no cyanosis or edema  Prostate: Not done.   Skin: Skin color, texture, turgor normal, no rashes or lesions  Lymph nodes: Cervical, supraclavicular, and axillary nodes normal  Neurologic: CNII-XII grossly intact. Normal strength, sensation and reflexes throughout     Assessment/Plan:   [place] was seen today for annual exam.  Diagnoses  and all orders for this visit:  Routine physical examination Today patient counseled on age appropriate routine health concerns for screening and prevention, each reviewed and up to date or declined. Immunizations reviewed and up to date or declined. Labs ordered and reviewed. Risk factors for depression reviewed and negative. Hearing function and visual acuity are intact. ADLs screened and addressed as needed. Functional ability and level of safety reviewed and appropriate. Education, counseling and referrals performed based on assessed risks today. Patient provided with a copy of personalized plan for preventive services.  Grieving; Situational insomnia Improved with time off from work. FMLA completed to have patient RTW tomorrow. Follow-up as needed. I discussed with patient that if they develop any SI, to tell someone immediately and seek medical attention.  Essential hypertension Well controlled with hctz 25 mg daily. Follow-up in 57mo - 43yr, sooner if concerns. -     CBC with Differential/Platelet -     Comprehensive metabolic panel  Insulin resistance Update A1c and adjust metformin 500 mg daily prn. Follow-up  will be determined by A1c results. Discussed need to reduce sugary beverages. -     Hemoglobin A1c  Hyperlipidemia, unspecified hyperlipidemia type Update lipid panel and adjust lipitor 20 mg daily prn. -     Lipid panel  Obesity, unspecified classification, unspecified obesity type, unspecified whether serious comorbidity present Encouraged healthy diet and more exercise as needed.  Encounter for screening for other viral diseases -     Hepatitis C antibody  Special screening for malignant neoplasms, colon -     Ambulatory referral to Gastroenterology  Other orders -     fluticasone (FLONASE) 50 MCG/ACT nasal spray; Place 2 sprays into both nostrils daily.   Well Adult Exam: Labs ordered: Yes. Patient counseling was done. See below for items discussed. Discussed  the patient's BMI.  The BMI is not in the acceptable range; BMI management plan is completed Follow up TBD.  Patient Counseling: [x]   Nutrition: Stressed importance of moderation in sodium/caffeine intake, saturated fat and cholesterol, caloric balance, sufficient intake of fresh fruits, vegetables, and fiber.  [x]   Stressed the importance of regular exercise.   []   Substance Abuse: Discussed cessation/primary prevention of tobacco, alcohol, or other drug use; driving or other dangerous activities under the influence; availability of treatment for abuse.   [x]   Injury prevention: Discussed safety belts, safety helmets, smoke detector, smoking near bedding or upholstery.   []   Sexuality: Discussed sexually transmitted diseases, partner selection, use of condoms, avoidance of unintended pregnancy  and contraceptive alternatives.   [x]   Dental health: Discussed importance of regular tooth brushing, flossing, and dental visits.  [x]   Health maintenance and immunizations reviewed. Please refer to Health maintenance section.    CMA or LPN served as scribe during this visit. History, Physical, and Plan performed by medical provider. The above documentation has been reviewed and is accurate and complete.  [name], PA-C [place]

## 2020-08-23 ENCOUNTER — Other Ambulatory Visit: Payer: Self-pay | Admitting: Physician Assistant

## 2020-08-23 DIAGNOSIS — R7303 Prediabetes: Secondary | ICD-10-CM

## 2020-08-23 DIAGNOSIS — Z13220 Encounter for screening for lipoid disorders: Secondary | ICD-10-CM

## 2020-08-23 LAB — HEPATITIS C ANTIBODY
Hepatitis C Ab: NONREACTIVE
SIGNAL TO CUT-OFF: 0.02 (ref <1.00)

## 2020-08-23 MED ORDER — METFORMIN HCL 500 MG PO TABS: 500.0000 mg | ORAL_TABLET | Freq: Every day | ORAL | 3 refills | Status: DC

## 2020-08-23 MED ORDER — ATORVASTATIN CALCIUM 20 MG PO TABS: 1.0000 | ORAL_TABLET | Freq: Every day | ORAL | 3 refills | Status: DC

## 2020-09-01 ENCOUNTER — Other Ambulatory Visit: Payer: Self-pay | Admitting: Physician Assistant

## 2020-10-18 ENCOUNTER — Other Ambulatory Visit: Payer: Self-pay

## 2020-10-18 ENCOUNTER — Telehealth: Payer: Self-pay | Admitting: Family

## 2020-10-18 ENCOUNTER — Telehealth (INDEPENDENT_AMBULATORY_CARE_PROVIDER_SITE_OTHER): Payer: BC Managed Care – PPO | Admitting: Family

## 2020-10-18 DIAGNOSIS — R519 Headache, unspecified: Secondary | ICD-10-CM | POA: Diagnosis not present

## 2020-10-18 NOTE — Assessment & Plan Note (Signed)
I do not have a BP on him.  I would like to start with checking blood pressure. If BP is running high, it could certainly be contributing to his HA. He is not at home but states he is 10 minutes from home. I advised him to check his blood pressure and send me his reading via mychart. He agrees to do so.  Further recommendations pending review of his blood prssure.

## 2020-10-18 NOTE — Telephone Encounter (Signed)
See mychart.  

## 2020-10-18 NOTE — Progress Notes (Signed)
MyChart Video Visit    Virtual Visit via Video Note   This visit type was conducted due to national recommendations for restrictions regarding the COVID-19 Pandemic (e.g. social distancing) in an effort to limit this patient's exposure and mitigate transmission in our community. This patient is at least at moderate risk for complications without adequate follow up. This format is felt to be most appropriate for this patient at this time. Physical exam was limited by quality of the video and audio technology used for the visit. CMA was able to get the patient set up on a video visit.  Patient location: parked car Patient and provider in visit Provider location: home  I discussed the limitations of evaluation and management by telemedicine and the availability of in person appointments. The patient expressed understanding and agreed to proceed.  Visit Date: 10/18/2020  Today's healthcare provider: Nance Pear, NP     Subjective:    Patient ID: Troy Arellano, male    DOB: 1965/07/15, 55 y.o.   MRN: [id number]  Chief Complaint  Patient presents with   Headache    Complains of headaches since last week.     HPI Patient is in today for headache.  He reports intermittent headaches since last week.  HA is located in the back of his head and he rates the pain 3-4/10.  Reports headache is non-throbbing in nature.  He is using tylenol with some relief.   He has been decreasing his salt intake and increasing his water consumption. 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  He has not checked his blood pressure recently, but reports good compliance with hctz.   BP Readings from Last 3 Encounters:  08/22/20 134/86  10/11/19 122/60  08/30/19 140/90     Past Medical History:  Diagnosis Date   Arthritis    Back   History of chicken pox    Hypertension     Past Surgical History:  Procedure Laterality Date   HERNIA REPAIR Right 1987    Family History  Problem  Relation Age of Onset   Hypertension Mother    Hypertension Father    Heart failure Father    Lymphoma Sister     Social History   Socioeconomic History   Marital status: Married    Spouse name: Not on file   Number of children: Not on file   Years of education: Not on file   Highest education level: Not on file  Occupational History   Not on file  Tobacco Use   Smoking status: Never   Smokeless tobacco: Never  Vaping Use   Vaping Use: Never used  Substance and Sexual Activity   Alcohol use: Yes    Comment: 2-3 drinks a week   Drug use: No   Sexual activity: Yes  Other Topics Concern   Not on file  Social History Narrative   Forklift driver   Single   21 yo son (in [place])   Social Determinants of Health   Financial Resource Strain: Not on file  Food Insecurity: Not on file  Transportation Needs: Not on file  Physical Activity: Not on file  Stress: Not on file  Social Connections: Not on file  Intimate Partner Violence: Not on file    Outpatient Medications Prior to Visit  Medication Sig Dispense Refill   atorvastatin (LIPITOR) 20 MG tablet Take 1 tablet (20 mg total) by mouth daily. 90 tablet 3   fluticasone (FLONASE) 50 MCG/ACT  nasal spray Place 2 sprays into both nostrils daily. 48 mL 1   hydrochlorothiazide (HYDRODIURIL) 25 MG tablet TAKE 1 TABLET BY MOUTH EVERY DAY 90 tablet 3   ibuprofen (ADVIL) 200 MG tablet Take 200 mg by mouth every 6 (six) hours as needed.     metFORMIN (GLUCOPHAGE) 500 MG tablet Take 1 tablet (500 mg total) by mouth daily with breakfast. 90 tablet 3   No facility-administered medications prior to visit.    No Known Allergies  ROS See HPI    Objective:    Physical Exam    Gen: Awake, alert, no acute distress Resp: Breathing is even and non-labored Psych: calm/pleasant demeanor Neuro: Alert and Oriented x 3, + facial symmetry, speech is clear.  Assessment & Plan:   Problem List Items Addressed This Visit   None   I am  having Troy Arellano maintain his ibuprofen, fluticasone, atorvastatin, metFORMIN, and hydrochlorothiazide.  No orders of the defined types were placed in this encounter.   I discussed the assessment and treatment plan with the patient. The patient was provided an opportunity to ask questions and all were answered. The patient agreed with the plan and demonstrated an understanding of the instructions.   The patient was advised to call back or seek an in-person evaluation if the symptoms worsen or if the condition fails to improve as anticipated.   Nance Pear, NP [employer] [facility] [phone] (phone) [phone] (fax)  [place]

## 2020-10-24 ENCOUNTER — Other Ambulatory Visit: Payer: Self-pay

## 2020-10-24 ENCOUNTER — Emergency Department (HOSPITAL_BASED_OUTPATIENT_CLINIC_OR_DEPARTMENT_OTHER)
Admission: EM | Admit: 2020-10-24 | Discharge: 2020-10-24 | Disposition: A | Discharge status: Home / Self Care | Payer: BC Managed Care – PPO | Attending: Emergency Medicine | Admitting: Emergency Medicine

## 2020-10-24 ENCOUNTER — Encounter (HOSPITAL_BASED_OUTPATIENT_CLINIC_OR_DEPARTMENT_OTHER): Payer: Self-pay

## 2020-10-24 DIAGNOSIS — M791 Myalgia, unspecified site: Secondary | ICD-10-CM | POA: Insufficient documentation

## 2020-10-24 DIAGNOSIS — R519 Headache, unspecified: Secondary | ICD-10-CM | POA: Diagnosis present

## 2020-10-24 DIAGNOSIS — R059 Cough, unspecified: Secondary | ICD-10-CM | POA: Insufficient documentation

## 2020-10-24 DIAGNOSIS — Z20822 Contact with and (suspected) exposure to covid-19: Secondary | ICD-10-CM | POA: Diagnosis not present

## 2020-10-24 DIAGNOSIS — M5481 Occipital neuralgia: Secondary | ICD-10-CM | POA: Insufficient documentation

## 2020-10-24 DIAGNOSIS — R52 Pain, unspecified: Secondary | ICD-10-CM

## 2020-10-24 DIAGNOSIS — I10 Essential (primary) hypertension: Secondary | ICD-10-CM | POA: Diagnosis not present

## 2020-10-24 DIAGNOSIS — Z79899 Other long term (current) drug therapy: Secondary | ICD-10-CM | POA: Diagnosis not present

## 2020-10-24 LAB — RESP PANEL BY RT-PCR (FLU A&B, COVID) ARPGX2
Influenza A by PCR: NEGATIVE
Influenza B by PCR: NEGATIVE
SARS Coronavirus 2 by RT PCR: NEGATIVE

## 2020-10-24 MED ORDER — DOXYCYCLINE HYCLATE 100 MG PO CAPS: 100.0000 mg | ORAL_CAPSULE | Freq: Two times a day (BID) | ORAL | 0 refills | Status: DC

## 2020-10-24 MED ORDER — DOXYCYCLINE HYCLATE 100 MG PO TABS
100.0000 mg | ORAL_TABLET | Freq: Once | ORAL | Status: AC
  Administered 2020-10-24: 100 mg via ORAL
  Filled 2020-10-24: qty 1

## 2020-10-24 NOTE — Discharge Instructions (Addendum)
Follow up with your doctor.   Take 4 over the counter ibuprofen tablets 3 times a day or 2 over-the-counter naproxen tablets twice a day for pain. Also take tylenol 1000mg (2 extra strength) four times a day.

## 2020-10-24 NOTE — ED Provider Notes (Signed)
[facility] EMERGENCY DEPT Provider Note   CSN: [id number] Arrival date & time: 10/24/20  8101     History Chief Complaint  Patient presents with   Generalized Body Aches    Headache x 2 weeks, body aches since yesterday    Troy Arellano is a 55 y.o. male.  55 yo M with a chief complaints of headache.  This is occipital has been coming and going.  Tends to resolve with Tylenol.  Yesterday developed a mild cough and then today was having diffuse body aches.  He did suffer some sort of insect bite he thinks a couple weeks ago.  He otherwise denies tick exposure or rash.  Denies sick contacts.  Denies nausea vomiting or diarrhea.  Denies decreased oral intake.  Denies one-sided numbness or weakness denies difficulty with speech or swallowing.  Denies head or neck trauma.  The history is provided by the patient.  Illness Severity:  Moderate Onset quality:  Gradual Duration:  2 weeks Timing:  Intermittent Progression:  Waxing and waning Chronicity:  New Associated symptoms: headaches and myalgias   Associated symptoms: no abdominal pain, no chest pain, no congestion, no diarrhea, no fever, no rash, no shortness of breath and no vomiting       Past Medical History:  Diagnosis Date   Arthritis    Back   History of chicken pox    Hypertension     Patient Active Problem List   Diagnosis Date Noted   Nonintractable headache 10/18/2020   Hyperlipidemia 10/11/2019   Insulin resistance 10/11/2019   Chronic right shoulder pain 08/30/2019   Obesity 08/30/2019   Essential hypertension 08/30/2019    Past Surgical History:  Procedure Laterality Date   HERNIA REPAIR Right 1987       Family History  Problem Relation Age of Onset   Hypertension Mother    Hypertension Father    Heart failure Father    Lymphoma Sister     Social History   Tobacco Use   Smoking status: Never   Smokeless tobacco: Never  Vaping Use   Vaping Use: Never used  Substance Use  Topics   Alcohol use: Yes    Comment: 2-3 drinks a week   Drug use: No    Home Medications Prior to Admission medications   Medication Sig Start Date End Date Taking? Authorizing Provider  atorvastatin (LIPITOR) 20 MG tablet Take 1 tablet (20 mg total) by mouth daily. 08/23/20  Yes [name], PA  doxycycline (VIBRAMYCIN) 100 MG capsule Take 1 capsule (100 mg total) by mouth 2 (two) times daily. One po bid x 7 days 10/24/20  Yes Deno Etienne, DO  fluticasone [facility]) 50 MCG/ACT nasal spray Place 2 sprays into both nostrils daily. 08/22/20  Yes Worley, Aldona Bar, PA  hydrochlorothiazide (HYDRODIURIL) 25 MG tablet TAKE 1 TABLET BY MOUTH EVERY DAY 09/01/20  Yes [name], PA  ibuprofen (ADVIL) 200 MG tablet Take 200 mg by mouth every 6 (six) hours as needed.   Yes [provider]  metFORMIN (GLUCOPHAGE) 500 MG tablet Take 1 tablet (500 mg total) by mouth daily with breakfast. 08/23/20   [name], PA    Allergies    Patient has no known allergies.  Review of Systems   Review of Systems  Constitutional:  Negative for chills and fever.  HENT:  Negative for congestion and facial swelling.   Eyes:  Negative for discharge and visual disturbance.  Respiratory:  Negative for shortness of breath.   Cardiovascular:  Negative for chest pain and palpitations.  Gastrointestinal:  Negative for abdominal pain, diarrhea and vomiting.  Musculoskeletal:  Positive for myalgias. Negative for arthralgias.  Skin:  Negative for color change and rash.  Neurological:  Positive for headaches. Negative for tremors and syncope.  Psychiatric/Behavioral:  Negative for confusion and dysphoric mood.    Physical Exam Updated Vital Signs BP (!) 155/83 (BP Location: Right Arm)   Pulse 64   Temp 97.9 F (36.6 C) (Oral)   Ht 5\' 7"  (1.702 m)   Wt 102.1 kg   SpO2 100%   BMI 35.24 kg/m   Physical Exam Vitals and nursing note reviewed.  Constitutional:      Appearance: He is  well-developed.  HENT:     Head: Normocephalic and atraumatic.  Eyes:     Pupils: Pupils are equal, round, and reactive to light.  Neck:     Vascular: No JVD.  Cardiovascular:     Rate and Rhythm: Normal rate and regular rhythm.     Heart sounds: No murmur heard.   No friction rub. No gallop.  Pulmonary:     Effort: No respiratory distress.     Breath sounds: No wheezing.  Abdominal:     General: There is no distension.     Tenderness: There is no abdominal tenderness. There is no guarding or rebound.  Musculoskeletal:        General: Normal range of motion.     Cervical back: Normal range of motion and neck supple.  Skin:    Coloration: Skin is not pale.     Findings: No rash.     Comments: Erythematous area to the lateral elbow.  Small scabbing.  No extensive rash.  Neurological:     Mental Status: He is alert and oriented to person, place, and time.     Cranial Nerves: Cranial nerves are intact.     Sensory: Sensation is intact.     Motor: Motor function is intact.     Coordination: Coordination is intact.     Gait: Gait is intact.     Comments: Benign neurologic exam  Psychiatric:        Behavior: Behavior normal.    ED Results / Procedures / Treatments   Labs (all labs ordered are listed, but only abnormal results are displayed) Labs Reviewed  RESP PANEL BY RT-PCR (FLU A&B, COVID) ARPGX2    EKG None  Radiology No results found.  Procedures Procedures   Medications Ordered in ED Medications  doxycycline (VIBRA-TABS) tablet 100 mg (has no administration in time range)    ED Course  I have reviewed the triage vital signs and the nursing notes.  Pertinent labs & imaging results that were available during my care of the patient were reviewed by me and considered in my medical decision making (see chart for details).    MDM Rules/Calculators/A&P                          55 yo M with a chief complaints of headache and now body aches.  Has had a headache  for couple weeks seems to come and go.  Occipital sounds most like tension headache based on history.  Not having a headache currently.  Has a benign neurologic exam.  Will hold off on imaging.  Now complaining also of diffuse myalgias.  Question viral syndrome.  Has had a mild cough since yesterday.  Will test for COVID.  Also possible  tickborne illness as he had had some sort of insect exposure prior to the onset of his symptoms.  We will treat with a course of doxycycline.  PCP follow-up.  10:29 AM:  I have discussed the diagnosis/risks/treatment options with the patient and family and believe the pt to be eligible for discharge home to follow-up with PCP. We also discussed returning to the ED immediately if new or worsening sx occur. We discussed the sx which are most concerning (e.g., sudden worsening pain, fever, inability to tolerate by mouth) that necessitate immediate return. Medications administered to the patient during their visit and any new prescriptions provided to the patient are listed below.  Medications given during this visit Medications  doxycycline (VIBRA-TABS) tablet 100 mg (has no administration in time range)     The patient appears reasonably screen and/or stabilized for discharge and I doubt any other medical condition or other [facility] requiring further screening, evaluation, or treatment in the ED at this time prior to discharge.   Final Clinical Impression(s) / ED Diagnoses Final diagnoses:  Occipital headache  Body aches    Rx / DC Orders ED Discharge Orders          Ordered    doxycycline (VIBRAMYCIN) 100 MG capsule  2 times daily        10/24/20 1024             Deno Etienne, DO 10/24/20 1029

## 2020-10-24 NOTE — ED Triage Notes (Signed)
"  Headache x 2 weeks, body aches since yesterday, denies n/v/d , denies sob" per pt

## 2020-11-17 ENCOUNTER — Telehealth: Payer: Self-pay

## 2020-11-17 ENCOUNTER — Encounter: Payer: Self-pay | Admitting: Gastroenterology

## 2020-11-17 NOTE — Telephone Encounter (Signed)
Spoke to the patient today and he states his shoulder is giving him a lot of trouble and he is in pain-I advised he contact the provider that did the surgery and if they can not prescribe anything he could set up a visit to be seen here before anything can be prescribed-I also gave him the phone # for [facility] so that he can get himself scheduled-call [phone] to verify this has been done

## 2020-11-18 ENCOUNTER — Other Ambulatory Visit: Payer: Self-pay | Admitting: Physician Assistant

## 2020-12-14 ENCOUNTER — Ambulatory Visit (AMBULATORY_SURGERY_CENTER): Payer: BC Managed Care – PPO | Admitting: *Deleted

## 2020-12-14 ENCOUNTER — Other Ambulatory Visit: Payer: Self-pay

## 2020-12-14 VITALS — Ht 67.0 in | Wt 225.0 lb

## 2020-12-14 DIAGNOSIS — Z1211 Encounter for screening for malignant neoplasm of colon: Secondary | ICD-10-CM

## 2020-12-14 MED ORDER — PEG 3350-KCL-NA BICARB-NACL 420 G PO SOLR: 4000.0000 mL | Freq: Once | ORAL | 0 refills | Status: AC

## 2020-12-14 NOTE — Progress Notes (Signed)
Patient's pre-visit was done today over the phone with the patient due to COVID-19 pandemic. Name,DOB and address verified. Insurance verified. Patient denies any allergies to Eggs and Soy. Patient denies any problems with anesthesia/sedation. Patient denies taking diet pills or blood thinners. No home Oxygen. Packet of Prep instructions mailed to patient including a copy of a consent form-pt is aware. Patient understands to call us back with any questions or concerns. Patient is aware of our care-partner policy and 0000000 safety protocol.   EMMI education assigned to the patient for the procedure, sent to [place].   The patient is COVID-19 vaccinated.

## 2020-12-26 ENCOUNTER — Encounter: Payer: Self-pay | Admitting: Gastroenterology

## 2020-12-28 ENCOUNTER — Encounter: Payer: Self-pay | Admitting: Gastroenterology

## 2020-12-28 ENCOUNTER — Other Ambulatory Visit: Payer: Self-pay

## 2020-12-28 ENCOUNTER — Ambulatory Visit (AMBULATORY_SURGERY_CENTER): Payer: BC Managed Care – PPO | Admitting: Gastroenterology

## 2020-12-28 VITALS — BP 124/76 | HR 67 | Temp 97.3°F | Resp 22 | Ht 67.0 in | Wt 225.0 lb

## 2020-12-28 DIAGNOSIS — D122 Benign neoplasm of ascending colon: Secondary | ICD-10-CM | POA: Diagnosis not present

## 2020-12-28 DIAGNOSIS — Z1211 Encounter for screening for malignant neoplasm of colon: Secondary | ICD-10-CM | POA: Diagnosis present

## 2020-12-28 MED ORDER — SODIUM CHLORIDE 0.9 % IV SOLN: 500.0000 mL | Freq: Once | INTRAVENOUS | Status: DC

## 2020-12-28 NOTE — Progress Notes (Signed)
Pt's states no medical or surgical changes since previsit or office visit. VS assessed by N.C ?

## 2020-12-28 NOTE — Progress Notes (Signed)
History and Physical:  This patient presents for endoscopic testing for: Encounter Diagnosis  Name Primary?   Special screening for malignant neoplasms, colon Yes   No complaints today.   Past Medical History: Past Medical History:  Diagnosis Date   Arthritis    Back   Diabetes mellitus without complication ([place])    History of chicken pox    Hypertension      Past Surgical History: Past Surgical History:  Procedure Laterality Date   HERNIA REPAIR Right 1987    Allergies: No Known Allergies  Outpatient Meds: Current Outpatient Medications  Medication Sig Dispense Refill   atorvastatin (LIPITOR) 20 MG tablet Take 1 tablet (20 mg total) by mouth daily. 90 tablet 3   hydrochlorothiazide (HYDRODIURIL) 25 MG tablet TAKE 1 TABLET BY MOUTH EVERY DAY 90 tablet 3   ibuprofen (ADVIL) 200 MG tablet Take 200 mg by mouth every 6 (six) hours as needed.     metFORMIN (GLUCOPHAGE) 500 MG tablet Take 1 tablet (500 mg total) by mouth daily with breakfast. 90 tablet 3   mometasone (NASONEX) 50 MCG/ACT nasal spray SMARTSIG:1 Spray(s) Both Nares Daily PRN (Patient not taking: Reported on 12/28/2020)     Current Facility-Administered Medications  Medication Dose Route Frequency Provider Last Rate Last Admin   0.9 %  sodium chloride infusion  500 mL Intravenous Once Danis, Kirke Corin, MD          ___________________________________________________________________ Objective   Exam:  BP 124/75   Pulse 90   Temp (!) 97.3 F (36.3 C) (Skin)   Ht '5\' 7"'$  (1.702 m)   Wt 225 lb (102.1 kg)   SpO2 96%   BMI 35.24 kg/m  Well-appearing CV: RRR without murmur, S1/S2 Resp: clear to auscultation bilaterally, normal RR and effort noted GI: soft, no tenderness, with active bowel sounds.   Assessment: Encounter Diagnosis  Name Primary?   Special screening for malignant neoplasms, colon Yes     Plan: Colonoscopy  The benefits and risks of the planned procedure were described in detail  with the patient or (when appropriate) their health care proxy.  Risks were outlined as including, but not limited to, bleeding, infection, perforation, adverse medication reaction leading to cardiac or pulmonary decompensation, pancreatitis (if ERCP).  The limitation of incomplete mucosal visualization was also discussed.  No guarantees or warranties were given.    The patient is appropriate for an endoscopic procedure in the ambulatory setting.   - Wilfrid Lund, MD

## 2020-12-28 NOTE — Op Note (Signed)
[facility] Patient Name: Troy Arellano Procedure Date: 12/28/2020 2:48 PM MRN: [id number] Endoscopist: Mallie Mussel L. Loletha Carrow , MD Age: 55 Referring MD:  Date of Birth: 1965-11-13 Gender: Male Account #: [id number] Procedure:                Colonoscopy Indications:              Screening for colorectal malignant neoplasm, This                            is the patient's first colonoscopy Medicines:                Monitored Anesthesia Care Procedure:                Pre-Anesthesia Assessment:                           - Prior to the procedure, a History and Physical                            was performed, and patient medications and                            allergies were reviewed. The patient's tolerance of                            previous anesthesia was also reviewed. The risks                            and benefits of the procedure and the sedation                            options and risks were discussed with the patient.                            All questions were answered, and informed consent                            was obtained. Prior Anticoagulants: The patient has                            taken no previous anticoagulant or antiplatelet                            agents. ASA Grade Assessment: II - A patient with                            mild systemic disease. After reviewing the risks                            and benefits, the patient was deemed in                            satisfactory condition to undergo the procedure.  After obtaining informed consent, the colonoscope                            was passed under direct vision. Throughout the                            procedure, the patient's blood pressure, pulse, and                            oxygen saturations were monitored continuously. The                            CF [id number] was introduced through the anus                            and advanced to the the  cecum, identified by                            appendiceal orifice and ileocecal valve. The                            colonoscopy was somewhat difficult due to a                            redundant colon. Successful completion of the                            procedure was aided by using manual pressure. The                            patient tolerated the procedure well. The quality                            of the bowel preparation was good. The ileocecal                            valve, appendiceal orifice, and rectum were                            photographed. The bowel preparation used was                            GoLYTELY. Scope In: 2:56:40 PM Scope Out: 3:13:10 PM Scope Withdrawal Time: 0 hours 10 minutes 52 seconds  Total Procedure Duration: 0 hours 16 minutes 30 seconds  Findings:                 The perianal and digital rectal examinations were                            normal.                           A diminutive polyp was found in the ascending  colon. The polyp was semi-sessile. The polyp was                            removed with a cold snare. Resection and retrieval                            were complete.                           The exam was otherwise without abnormality on                            direct and retroflexion views. Complications:            No immediate complications. Estimated Blood Loss:     Estimated blood loss was minimal. Impression:               - One diminutive polyp in the ascending colon,                            removed with a cold snare. Resected and retrieved.                           - The examination was otherwise normal on direct                            and retroflexion views. Recommendation:           - Patient has a contact number available for                            emergencies. The signs and symptoms of potential                            delayed complications were discussed with  the                            patient. Return to normal activities tomorrow.                            Written discharge instructions were provided to the                            patient.                           - Resume previous diet.                           - Continue present medications.                           - Await pathology results.                           - Repeat colonoscopy is recommended for  surveillance. The colonoscopy date will be                            determined after pathology results from today's                            exam become available for review. Johnanna Bakke L. Loletha Carrow, MD 12/28/2020 3:15:53 PM This report has been signed electronically.

## 2020-12-28 NOTE — Progress Notes (Signed)
Called to room to assist during endoscopic procedure.  Patient ID and intended procedure confirmed with present staff. Received instructions for my participation in the procedure from the performing physician.  

## 2020-12-28 NOTE — Progress Notes (Signed)
Sedation, gd SR's, VSS, report to RN

## 2020-12-28 NOTE — Patient Instructions (Signed)
Please read handouts provided. Continue present medications. Await pathology results.   YOU HAD AN ENDOSCOPIC PROCEDURE TODAY AT THE [facility]:   Refer to the procedure report that was given to you for any specific questions about what was found during the examination.  If the procedure report does not answer your questions, please call your gastroenterologist to clarify.  If you requested that your care partner not be given the details of your procedure findings, then the procedure report has been included in a sealed envelope for you to review at your convenience later.  YOU SHOULD EXPECT: Some feelings of bloating in the abdomen. Passage of more gas than usual.  Walking can help get rid of the air that was put into your GI tract during the procedure and reduce the bloating. If you had a lower endoscopy (such as a colonoscopy or flexible sigmoidoscopy) you may notice spotting of blood in your stool or on the toilet paper. If you underwent a bowel prep for your procedure, you may not have a normal bowel movement for a few days.  Please Note:  You might notice some irritation and congestion in your nose or some drainage.  This is from the oxygen used during your procedure.  There is no need for concern and it should clear up in a day or so.  SYMPTOMS TO REPORT IMMEDIATELY:  Following lower endoscopy (colonoscopy or flexible sigmoidoscopy):  Excessive amounts of blood in the stool  Significant tenderness or worsening of abdominal pains  Swelling of the abdomen that is new, acute  Fever of 100F or higher   For urgent or emergent issues, a gastroenterologist can be reached at any hour by calling [phone]. Do not use MyChart messaging for urgent concerns.    DIET:  We do recommend a small meal at first, but then you may proceed to your regular diet.  Drink plenty of fluids but you should avoid alcoholic beverages for 24 hours.  ACTIVITY:  You should plan to take it easy  for the rest of today and you should NOT DRIVE or use heavy machinery until tomorrow (because of the sedation medicines used during the test).    FOLLOW UP: Our staff will call the number listed on your records 48-72 hours following your procedure to check on you and address any questions or concerns that you may have regarding the information given to you following your procedure. If we do not reach you, we will leave a message.  We will attempt to reach you two times.  During this call, we will ask if you have developed any symptoms of COVID 19. If you develop any symptoms (ie: fever, flu-like symptoms, shortness of breath, cough etc.) before then, please call [phone].  If you test positive for Covid 19 in the 2 weeks post procedure, please call and report this information to us.    If any biopsies were taken you will be contacted by phone or by letter within the next 1-3 weeks.  Please call us at [phone] if you have not heard about the biopsies in 3 weeks.    SIGNATURES/CONFIDENTIALITY: You and/or your care partner have signed paperwork which will be entered into your electronic medical record.  These signatures attest to the fact that that the information above on your After Visit Summary has been reviewed and is understood.  Full responsibility of the confidentiality of this discharge information lies with you and/or your care-partner.  

## 2021-01-01 ENCOUNTER — Telehealth: Payer: Self-pay

## 2021-01-01 NOTE — Telephone Encounter (Signed)
  Follow up Call-  Call back number 12/28/2020  Post procedure Call Back phone  # [phone]  Permission to leave phone message Yes  Some recent data might be hidden     Patient questions:  Do you have a fever, pain , or abdominal swelling? No. Pain Score  0 *  Have you tolerated food without any problems? Yes.    Have you been able to return to your normal activities? Yes.    Do you have any questions about your discharge instructions: Diet   No. Medications  No. Follow up visit  No.  Do you have questions or concerns about your Care? No.  Actions: * If pain score is 4 or above: No action needed, pain <4.

## 2021-01-05 ENCOUNTER — Encounter: Payer: Self-pay | Admitting: Gastroenterology

## 2021-04-03 ENCOUNTER — Emergency Department (HOSPITAL_BASED_OUTPATIENT_CLINIC_OR_DEPARTMENT_OTHER)
Admission: EM | Admit: 2021-04-03 | Discharge: 2021-04-03 | Disposition: A | Discharge status: Home / Self Care | Payer: BC Managed Care – PPO | Attending: Emergency Medicine | Admitting: Emergency Medicine

## 2021-04-03 ENCOUNTER — Encounter (HOSPITAL_BASED_OUTPATIENT_CLINIC_OR_DEPARTMENT_OTHER): Payer: Self-pay

## 2021-04-03 ENCOUNTER — Other Ambulatory Visit: Payer: Self-pay

## 2021-04-03 DIAGNOSIS — Z7984 Long term (current) use of oral hypoglycemic drugs: Secondary | ICD-10-CM | POA: Diagnosis not present

## 2021-04-03 DIAGNOSIS — Z20822 Contact with and (suspected) exposure to covid-19: Secondary | ICD-10-CM | POA: Insufficient documentation

## 2021-04-03 DIAGNOSIS — H6691 Otitis media, unspecified, right ear: Secondary | ICD-10-CM | POA: Insufficient documentation

## 2021-04-03 DIAGNOSIS — Z79899 Other long term (current) drug therapy: Secondary | ICD-10-CM | POA: Insufficient documentation

## 2021-04-03 DIAGNOSIS — E1169 Type 2 diabetes mellitus with other specified complication: Secondary | ICD-10-CM | POA: Diagnosis not present

## 2021-04-03 DIAGNOSIS — E785 Hyperlipidemia, unspecified: Secondary | ICD-10-CM | POA: Diagnosis not present

## 2021-04-03 DIAGNOSIS — M7918 Myalgia, other site: Secondary | ICD-10-CM | POA: Diagnosis present

## 2021-04-03 DIAGNOSIS — B349 Viral infection, unspecified: Secondary | ICD-10-CM | POA: Diagnosis not present

## 2021-04-03 DIAGNOSIS — I10 Essential (primary) hypertension: Secondary | ICD-10-CM | POA: Diagnosis not present

## 2021-04-03 LAB — RESP PANEL BY RT-PCR (FLU A&B, COVID) ARPGX2
Influenza A by PCR: NEGATIVE
Influenza B by PCR: NEGATIVE
SARS Coronavirus 2 by RT PCR: NEGATIVE

## 2021-04-03 NOTE — Discharge Instructions (Signed)
You were seen today for upper respiratory symptoms and myalgias.  You likely have a viral illness.  You tested negative for COVID and influenza.  Take Tylenol or ibuprofen as needed for body aches and pains.  Make sure that you are staying hydrated.  You may return to work as long as you are fever free for 24 hours.

## 2021-04-03 NOTE — ED Triage Notes (Signed)
Body aches starting this past Sunday.

## 2021-04-03 NOTE — ED Provider Notes (Signed)
[facility] Provider Note   CSN: [id number] Arrival date & time: 04/03/21  8563     History Chief Complaint  Patient presents with   Generalized Body Aches    Troy Arellano is a 55 y.o. male.  HPI     This 55 year old male with a history of diabetes and hypertension who presents with myalgias and chills.  Patient reports 1 day history of myalgias, chills, nausea and diarrhea.  Reports sick contacts at work but is unsure whether they have tested positive for COVID or flu.  Has not had any documented fevers.  He does report some right ear pain and congestion.  No chest pain, shortness of breath, cough, abdominal pain.  Past Medical History:  Diagnosis Date   Arthritis    Back   Diabetes mellitus without complication ([place])    History of chicken pox    Hypertension     Patient Active Problem List   Diagnosis Date Noted   Nonintractable headache 10/18/2020   Hyperlipidemia 10/11/2019   Insulin resistance 10/11/2019   Chronic right shoulder pain 08/30/2019   Obesity 08/30/2019   Essential hypertension 08/30/2019    Past Surgical History:  Procedure Laterality Date   HERNIA REPAIR Right 1987       Family History  Problem Relation Age of Onset   Hypertension Mother    Hypertension Father    Heart failure Father    Lymphoma Sister    Colon cancer Neg Hx    Esophageal cancer Neg Hx    Rectal cancer Neg Hx    Stomach cancer Neg Hx    Colon polyps Neg Hx     Social History   Tobacco Use   Smoking status: Never   Smokeless tobacco: Never  Vaping Use   Vaping Use: Never used  Substance Use Topics   Alcohol use: Yes    Alcohol/week: 2.0 standard drinks    Types: 2 Standard drinks or equivalent per week    Comment: 2-3 drinks a week   Drug use: No    Home Medications Prior to Admission medications   Medication Sig Start Date End Date Taking? Authorizing Provider  atorvastatin (LIPITOR) 20 MG tablet Take 1 tablet (20 mg total)  by mouth daily. 08/23/20   [name], PA  hydrochlorothiazide (HYDRODIURIL) 25 MG tablet TAKE 1 TABLET BY MOUTH EVERY DAY 09/01/20   [name], PA  ibuprofen (ADVIL) 200 MG tablet Take 200 mg by mouth every 6 (six) hours as needed.    [provider]  metFORMIN (GLUCOPHAGE) 500 MG tablet Take 1 tablet (500 mg total) by mouth daily with breakfast. 08/23/20   [name], PA  mometasone (NASONEX) 50 MCG/ACT nasal spray SMARTSIG:1 Spray(s) Both Nares Daily PRN Patient not taking: Reported on 12/28/2020 09/04/20   [provider]    Allergies    Patient has no known allergies.  Review of Systems   Review of Systems  Constitutional:  Positive for chills. Negative for fever.  HENT:  Positive for congestion.   Respiratory: Negative.  Negative for cough, chest tightness and shortness of breath.   Cardiovascular: Negative.  Negative for chest pain.  Gastrointestinal:  Positive for diarrhea and nausea. Negative for abdominal pain.  Genitourinary: Negative.  Negative for dysuria.  Musculoskeletal:  Positive for myalgias. Negative for back pain.  Skin:  Negative for rash.  Neurological:  Negative for headaches.  All other systems reviewed and are negative.  Physical Exam Updated Vital Signs BP (!) 154/98 (  BP Location: Right Arm)   Pulse 71   Temp 98.2 F (36.8 C)   Resp 17   Ht 1.702 m (5\' 7" )   Wt 103 kg   SpO2 99%   BMI 35.55 kg/m   Physical Exam Vitals and nursing note reviewed.  Constitutional:      Appearance: He is well-developed. He is obese. He is not ill-appearing.  HENT:     Head: Normocephalic and atraumatic.     Left Ear: Tympanic membrane normal.     Ears:     Comments: Right TM without erythema or bulging, slight effusion noted    Mouth/Throat:     Mouth: Mucous membranes are moist.  Eyes:     Pupils: Pupils are equal, round, and reactive to light.  Cardiovascular:     Rate and Rhythm: Normal rate and regular rhythm.     Heart  sounds: Normal heart sounds. No murmur heard. Pulmonary:     Effort: Pulmonary effort is normal. No respiratory distress.     Breath sounds: Normal breath sounds. No wheezing.  Abdominal:     Palpations: Abdomen is soft.     Tenderness: There is no abdominal tenderness. There is no rebound.  Musculoskeletal:     Cervical back: Neck supple.     Right lower leg: No edema.     Left lower leg: No edema.  Lymphadenopathy:     Cervical: No cervical adenopathy.  Skin:    General: Skin is warm and dry.  Neurological:     Mental Status: He is alert and oriented to person, place, and time.  Psychiatric:        Mood and Affect: Mood normal.    ED Results / Procedures / Treatments   Labs (all labs ordered are listed, but only abnormal results are displayed) Labs Reviewed  RESP PANEL BY RT-PCR (FLU A&B, COVID) [id number]    EKG None  Radiology No results found.  Procedures Procedures   Medications Ordered in ED Medications - No data to display  ED Course  I have reviewed the triage vital signs and the nursing notes.  Pertinent labs & imaging results that were available during my care of the patient were reviewed by me and considered in my medical decision making (see chart for details).    MDM Rules/Calculators/A&P                           Patient presents with myalgias, ear pain, nausea and diarrhea.  He is nontoxic and vital signs are reassuring.  He is afebrile here.  Highly suspect viral etiology.  His physical exam is fairly benign.  He has no evidence of acute otitis media.  COVID and influenza testing sent.  This is negative.  Discussed with patient supportive measures including hydration, Tylenol, ibuprofen.  Patient stated understanding.  After history, exam, and medical workup I feel the patient has been appropriately medically screened and is safe for discharge home. Pertinent diagnoses were discussed with the patient. Patient was given return precautions.  Final  Clinical Impression(s) / ED Diagnoses Final diagnoses:  Viral illness    Rx / DC Orders ED Discharge Orders     None        Anthony Roland, Barbette Hair, MD 04/03/21 2353

## 2021-04-04 ENCOUNTER — Institutional Professional Consult (permissible substitution): Payer: BC Managed Care – PPO | Admitting: Plastic Surgery

## 2021-05-17 ENCOUNTER — Ambulatory Visit (HOSPITAL_COMMUNITY)
Admission: EM | Admit: 2021-05-17 | Discharge: 2021-05-17 | Disposition: A | Discharge status: Home / Self Care | Payer: BC Managed Care – PPO | Attending: Emergency Medicine | Admitting: Emergency Medicine

## 2021-05-17 ENCOUNTER — Encounter (HOSPITAL_COMMUNITY): Payer: Self-pay

## 2021-05-17 ENCOUNTER — Other Ambulatory Visit: Payer: Self-pay

## 2021-05-17 DIAGNOSIS — M5442 Lumbago with sciatica, left side: Secondary | ICD-10-CM

## 2021-05-17 MED ORDER — NAPROXEN 500 MG PO TABS: 500.0000 mg | ORAL_TABLET | Freq: Two times a day (BID) | ORAL | 0 refills | Status: DC

## 2021-05-17 MED ORDER — METHYLPREDNISOLONE SODIUM SUCC 125 MG IJ SOLR
INTRAMUSCULAR | Status: AC
  Filled 2021-05-17: qty 2

## 2021-05-17 MED ORDER — CYCLOBENZAPRINE HCL 10 MG PO TABS: 10.0000 mg | ORAL_TABLET | Freq: Every day | ORAL | 0 refills | Status: DC

## 2021-05-17 MED ORDER — METHYLPREDNISOLONE SODIUM SUCC 125 MG IJ SOLR
60.0000 mg | Freq: Once | INTRAMUSCULAR | Status: AC
  Administered 2021-05-17: 60 mg via INTRAMUSCULAR

## 2021-05-17 NOTE — ED Triage Notes (Signed)
Pt presents to urgent care for lower back pain x 3-4 days. He is using icy hot and motrin for pain.

## 2021-05-17 NOTE — ED Provider Notes (Signed)
[facility]    CSN: [id number] Arrival date & time: 05/17/21  8841      History   Chief Complaint Chief Complaint  Patient presents with   Back Pain    HPI Troy Arellano is a 56 y.o. male.    Patient presents with left-sided lower back pain for 1 week.  Pain radiates down leg with associated numbness and tingling.  Range of motion is intact.  Denies precipitating event, prior injury or trauma, urinary or bowel changes.  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  Has attempted use of ibuprofen, Tylenol, IcyHot which have been minimally helpful.  History of arthritis in back, diabetes mellitus and hypertension.  Past Medical History:  Diagnosis Date   Arthritis    Back   Diabetes mellitus without complication ([place])    History of chicken pox    Hypertension     Patient Active Problem List   Diagnosis Date Noted   Nonintractable headache 10/18/2020   Hyperlipidemia 10/11/2019   Insulin resistance 10/11/2019   Chronic right shoulder pain 08/30/2019   Obesity 08/30/2019   Essential hypertension 08/30/2019    Past Surgical History:  Procedure Laterality Date   HERNIA REPAIR Right 1987       Home Medications    Prior to Admission medications   Medication Sig Start Date End Date Taking? Authorizing Provider  atorvastatin (LIPITOR) 20 MG tablet Take 1 tablet (20 mg total) by mouth daily. 08/23/20   [name], PA  hydrochlorothiazide (HYDRODIURIL) 25 MG tablet TAKE 1 TABLET BY MOUTH EVERY DAY 09/01/20   [name], PA  ibuprofen (ADVIL) 200 MG tablet Take 200 mg by mouth every 6 (six) hours as needed.    [provider]  metFORMIN (GLUCOPHAGE) 500 MG tablet Take 1 tablet (500 mg total) by mouth daily with breakfast. 08/23/20   [name], PA  mometasone (NASONEX) 50 MCG/ACT nasal spray SMARTSIG:1 Spray(s) Both Nares Daily PRN Patient not taking: Reported on 12/28/2020 09/04/20   [provider]    Family  History Family History  Problem Relation Age of Onset   Hypertension Mother    Hypertension Father    Heart failure Father    Lymphoma Sister    Colon cancer Neg Hx    Esophageal cancer Neg Hx    Rectal cancer Neg Hx    Stomach cancer Neg Hx    Colon polyps Neg Hx     Social History Social History   Tobacco Use   Smoking status: Never   Smokeless tobacco: Never  Vaping Use   Vaping Use: Never used  Substance Use Topics   Alcohol use: Yes    Alcohol/week: 2.0 standard drinks    Types: 2 Standard drinks or equivalent per week    Comment: 2-3 drinks a week   Drug use: No     Allergies   Patient has no known allergies.   Review of Systems Review of Systems  Constitutional: Negative.   Respiratory: Negative.    Cardiovascular: Negative.   Musculoskeletal:  Positive for back pain. Negative for arthralgias, gait problem, joint swelling, myalgias, neck pain and neck stiffness.  Skin: Negative.   Neurological: Negative.     Physical Exam Triage Vital Signs ED Triage Vitals [05/17/21 1018]  Enc Vitals Group     BP (!) 142/87     Pulse Rate 60     Resp 16     Temp 98.2 F (36.8 C)  Temp Source Oral     SpO2 100 %     Weight      Height      Head Circumference      Peak Flow      Pain Score 7     Pain Loc      Pain Edu?      Excl. in [place]?    No data found.  Updated Vital Signs BP (!) 142/87 (BP Location: Left Arm)    Pulse 60    Temp 98.2 F (36.8 C) (Oral)    Resp 16    SpO2 100%   Visual Acuity Right Eye Distance:   Left Eye Distance:   Bilateral Distance:    Right Eye Near:   Left Eye Near:    Bilateral Near:     Physical Exam Constitutional:      Appearance: Normal appearance.  HENT:     Head: Normocephalic.  Eyes:     Extraocular Movements: Extraocular movements intact.  Cardiovascular:     Pulses: Normal pulses.  Pulmonary:     Effort: Pulmonary effort is normal.  Musculoskeletal:     Cervical back: Normal.     Thoracic back:  Normal.     Comments: Tenderness over the left latissimus dorsi, no crepitus, spasm, deformity, ecchymosis or swelling noted, range of motion intact  Skin:    General: Skin is warm and dry.  Neurological:     Mental Status: He is alert and oriented to person, place, and time. Mental status is at baseline.  Psychiatric:        Mood and Affect: Mood normal.        Behavior: Behavior normal.     UC Treatments / Results  Labs (all labs ordered are listed, but only abnormal results are displayed) Labs Reviewed - No data to display  EKG   Radiology No results found.  Procedures Procedures (including critical care time)  Medications Ordered in UC Medications - No data to display  Initial Impression / Assessment and Plan / UC Course  I have reviewed the triage vital signs and the nursing notes.  Pertinent labs & imaging results that were available during my care of the patient were reviewed by me and considered in my medical decision making (see chart for details).  Cute left-sided back pain with sciatica  Etiology of symptoms most likely muscular, discussed with patient, methylprednisolone 60 mg injection given in office as patient intends to attend work today, naproxen twice a day for 5 days prescribed and Flexeril for bedtime, recommended RICE, patent 15-minute intervals, pillows for support and daily stretching, walking referral given for orthopedics for persistent or reoccurring symptoms Final Clinical Impressions(s) / UC Diagnoses   Final diagnoses:  None   Discharge Instructions   None    ED Prescriptions   None    PDMP not reviewed this encounter.   Hans Eden, NP 05/17/21 1106

## 2021-05-17 NOTE — Discharge Instructions (Signed)
Your pain is most likely caused by irritation to the muscles or ligaments.   You have been given an injection of steroids here in office to help reduce inflammation and pain, I would not prescribe oral steroid course because you have diabetes and I will increase your blood sugars  Beginning tomorrow take naproxen twice a day for the next 5 days then may use medication as needed  You may use Flexeril at bedtime for additional comfort You may use heating pad in 15 minute intervals as needed for additional comfort, within the first 2-3 days you may find comfort in using ice in 10-15 minutes over affected area  Begin stretching affected area daily for 10 minutes as tolerated to further loosen muscles   When lying down place pillow underneath and between knees for support  Can try sleeping without pillow on firm mattress   Practice good posture: head back, shoulders back, chest forward, pelvis back and weight distributed evenly on both legs  If pain persist after recommended treatment or reoccurs if may be beneficial to follow up with orthopedic specialist for evaluation, this doctor specializes in the bones and can manage your symptoms long-term with options such as but not limited to imaging, medications or physical therapy

## 2021-05-24 ENCOUNTER — Encounter (HOSPITAL_BASED_OUTPATIENT_CLINIC_OR_DEPARTMENT_OTHER): Payer: Self-pay | Admitting: Emergency Medicine

## 2021-05-24 ENCOUNTER — Other Ambulatory Visit: Payer: Self-pay

## 2021-05-24 ENCOUNTER — Emergency Department (HOSPITAL_BASED_OUTPATIENT_CLINIC_OR_DEPARTMENT_OTHER): Payer: BC Managed Care – PPO

## 2021-05-24 ENCOUNTER — Emergency Department (HOSPITAL_BASED_OUTPATIENT_CLINIC_OR_DEPARTMENT_OTHER)
Admission: EM | Admit: 2021-05-24 | Discharge: 2021-05-25 | Disposition: A | Discharge status: Home / Self Care | Payer: BC Managed Care – PPO | Attending: Emergency Medicine | Admitting: Emergency Medicine

## 2021-05-24 DIAGNOSIS — Z79899 Other long term (current) drug therapy: Secondary | ICD-10-CM | POA: Insufficient documentation

## 2021-05-24 DIAGNOSIS — I10 Essential (primary) hypertension: Secondary | ICD-10-CM | POA: Insufficient documentation

## 2021-05-24 DIAGNOSIS — Z7984 Long term (current) use of oral hypoglycemic drugs: Secondary | ICD-10-CM | POA: Diagnosis not present

## 2021-05-24 DIAGNOSIS — R0789 Other chest pain: Secondary | ICD-10-CM | POA: Insufficient documentation

## 2021-05-24 DIAGNOSIS — E119 Type 2 diabetes mellitus without complications: Secondary | ICD-10-CM | POA: Insufficient documentation

## 2021-05-24 DIAGNOSIS — X503XXA Overexertion from repetitive movements, initial encounter: Secondary | ICD-10-CM | POA: Diagnosis not present

## 2021-05-24 DIAGNOSIS — Y990 Civilian activity done for income or pay: Secondary | ICD-10-CM | POA: Diagnosis not present

## 2021-05-24 NOTE — ED Triage Notes (Signed)
Chest pain since about 4-5 pm today got worse  tonight. Pt states about midsternum pain is constant with rest.

## 2021-05-25 ENCOUNTER — Emergency Department (HOSPITAL_BASED_OUTPATIENT_CLINIC_OR_DEPARTMENT_OTHER): Payer: BC Managed Care – PPO

## 2021-05-25 LAB — BASIC METABOLIC PANEL
Anion gap: 8 (ref 5–15)
BUN: 15 mg/dL (ref 6–20)
CO2: 26 mmol/L (ref 22–32)
Calcium: 9.3 mg/dL (ref 8.9–10.3)
Chloride: 101 mmol/L (ref 98–111)
Creatinine, Ser: 0.95 mg/dL (ref 0.61–1.24)
GFR, Estimated: >60 mL/min (ref >60)
Glucose, Bld: 109 mg/dL — ABNORMAL HIGH (ref 70–99)
Potassium: 3.3 mmol/L — ABNORMAL LOW (ref 3.5–5.1)
Sodium: 135 mmol/L (ref 135–145)

## 2021-05-25 LAB — HEPATIC FUNCTION PANEL
ALT: 26 U/L (ref 0–44)
AST: 27 U/L (ref 15–41)
Albumin: 3.8 g/dL (ref 3.5–5.0)
Alkaline Phosphatase: 54 U/L (ref 38–126)
Bilirubin, Direct: <0.1 mg/dL (ref 0.0–0.2)
Total Bilirubin: 0.7 mg/dL (ref 0.3–1.2)
Total Protein: 7.6 g/dL (ref 6.5–8.1)

## 2021-05-25 LAB — TROPONIN I (HIGH SENSITIVITY)
Troponin I (High Sensitivity): 4 ng/L (ref <18)
Troponin I (High Sensitivity): 4 ng/L (ref <18)

## 2021-05-25 LAB — URINALYSIS, ROUTINE W REFLEX MICROSCOPIC
Bilirubin Urine: NEGATIVE
Glucose, UA: NEGATIVE mg/dL
Hgb urine dipstick: NEGATIVE
Ketones, ur: NEGATIVE mg/dL
Leukocytes,Ua: NEGATIVE
Nitrite: NEGATIVE
Protein, ur: NEGATIVE mg/dL
Specific Gravity, Urine: 1.01 (ref 1.005–1.030)
pH: 6 (ref 5.0–8.0)

## 2021-05-25 LAB — CBC
HCT: 40.9 % (ref 39.0–52.0)
Hemoglobin: 13.8 g/dL (ref 13.0–17.0)
MCH: 28.2 pg (ref 26.0–34.0)
MCHC: 33.7 g/dL (ref 30.0–36.0)
MCV: 83.6 fL (ref 80.0–100.0)
Platelets: 225 10*3/uL (ref 150–400)
RBC: 4.89 MIL/uL (ref 4.22–5.81)
RDW: 13.9 % (ref 11.5–15.5)
WBC: 4.9 10*3/uL (ref 4.0–10.5)
nRBC: 0 % (ref 0.0–0.2)

## 2021-05-25 LAB — LIPASE, BLOOD: Lipase: 31 U/L (ref 11–51)

## 2021-05-25 MED ORDER — ALUM & MAG HYDROXIDE-SIMETH 200-200-20 MG/5ML PO SUSP
30.0000 mL | Freq: Once | ORAL | Status: AC
  Administered 2021-05-25: 30 mL via ORAL
  Filled 2021-05-25: qty 30

## 2021-05-25 MED ORDER — IOHEXOL 350 MG/ML SOLN
100.0000 mL | Freq: Once | INTRAVENOUS | Status: AC | PRN
  Administered 2021-05-25: 100 mL via INTRAVENOUS

## 2021-05-25 MED ORDER — FAMOTIDINE IN NACL 20-0.9 MG/50ML-% IV SOLN
20.0000 mg | Freq: Once | INTRAVENOUS | Status: AC
  Administered 2021-05-25: 20 mg via INTRAVENOUS
  Filled 2021-05-25: qty 50

## 2021-05-25 MED ORDER — PANTOPRAZOLE SODIUM 40 MG PO TBEC: 40.0000 mg | DELAYED_RELEASE_TABLET | Freq: Two times a day (BID) | ORAL | 0 refills | Status: DC

## 2021-05-25 NOTE — Discharge Instructions (Signed)
You had a CT scan performed today that showed some changes to your bladder as well as an enlarged prostate.  Please call to follow up with Urology for further evaluation.

## 2021-05-25 NOTE — Progress Notes (Signed)
Troy Arellano is a 56 y.o. male here for hospital follow up of chest pain.  History of Present Illness:   Chief Complaint  Patient presents with   Follow-up    Pt is here for f/u ED visit on 05/24/2021.    HPI  Chest Pain On 05/24/21, Troy Arellano presented to the ED with c/o left-sided chest pain that had started a few hours prior. 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  Pt described the pain as a sharp, pricking sensation mainly in his left lower sternal region. According to him the pain was intermittent, only lasting about a minute each occurrence. Denied fever, nausea, vomiting, SOB, diaphoresis, LE Edema, or tobacco/drug use.  Due to nature of sx and fhx, multiple tests were ordered including EKG, CTA, and CT of chest. EKG was without acute ischemic changes, troponins were negative x2 and CTA was negative for PE, dissection. Although his potassium was 3.3 and blood glucose was 109, all other results were WNL. Following this, pt was suspected to have gastritis secondary to recent NSAID use due to left flank pain. [name] was recommended to discontinue use of NSAIDs and prescribed protonix 40 mg twice daily.   Currently he has been compliant with taking protonix 40 mg twice daily with no complications. He has found this to be beneficial and has not experiencing additional chest pain. He is managing well.    Left Flank Pain In addition to his chest pain, he admitted that earlier in the week he began experiencing left flank pain which was also described as a sharp sensation that has steadily radiated to the left side of his abdomen. The flank pain had become so bothersome that he decided to visit the urgent care for evaluation. At the time of this visit he had reported this pain had been ongoing for a week, usually radiating down his leg with associated numbness and tingling. Prior to visit, he had been using ibuprofen, tylenol,  and IcyHot which provided minor relief. He does have a hx of arthritis in back, diabetes mellitus, and HTN.   After further evaluation, including a physical exam and routine lab work, it was determined that etiology of sx was most likely muscular. Due to this, pt was given a methylprednisolone 60 mg injection and prescribed to take naproxen twice a day x 5 days and use flexeril 10 mg nightly. Additionally [name] was recommended RICE and to complete daily stretches. Although Troy Arellano was compliant with taking aleve twice daily x 5 days, he was still experiencing pain well into his visit to the ED for CP. Due to CT of chest finding his prostate to be enlarged and bladder was thickened, further testing was completed such as a UA and urine culture. Results of the UA were not consistent with infection and his culture showed no growth. Due to findings,  pt was recommended to follow up with urology for further evaluation.   Currently he states he has been experiencing an increase in urination despite fluid intake for a while prior to ED visit. Denies hematuria, difficulty stopping stream, dysuria, concerns for STDs, or burning with urination.   Bilateral Inguinal Hernia While being evaluated for his chest pain, he underwent a CT which found bilateral small volume inguinal hernia on the left containing fat and on the right containing fat as well as a short loop of small bowel. According to pt, he has had this  issue before, in the same area when he was a teenager. He did undergo correction of this and had no other issues since this recent finding. Denies abdominal pain, rectal bleeding, constipation, melena, or hematochezia.   Diabetes Currently compliant with taking metformin 500 mg daily with no complications. He has found that he has lost weight recently, but believes it's due to watching what he has been eating. Blood sugars at home are: not checked. Denies: hypoglycemic or hyperglycemic episodes or symptoms.    HLD Currently taking lipitor 20 mg daily. Tolerating well.  Lab Results  Component Value Date   HGBA1C 6.2 08/22/2020    Past Medical History:  Diagnosis Date   Arthritis    Back   Diabetes mellitus without complication ([place])    History of chicken pox    Hypertension      Social History   Tobacco Use   Smoking status: Never   Smokeless tobacco: Never  Vaping Use   Vaping Use: Never used  Substance Use Topics   Alcohol use: Yes    Alcohol/week: 2.0 standard drinks    Types: 2 Standard drinks or equivalent per week    Comment: 2-3 drinks a week   Drug use: No    Past Surgical History:  Procedure Laterality Date   HERNIA REPAIR Right 1987    Family History  Problem Relation Age of Onset   Hypertension Mother    Hypertension Father    Heart failure Father    Lymphoma Sister    Colon cancer Neg Hx    Esophageal cancer Neg Hx    Rectal cancer Neg Hx    Stomach cancer Neg Hx    Colon polyps Neg Hx     No Known Allergies  Current Medications:   Current Outpatient Medications:    atorvastatin (LIPITOR) 20 MG tablet, Take 1 tablet (20 mg total) by mouth daily., Disp: 90 tablet, Rfl: 3   cyclobenzaprine (FLEXERIL) 10 MG tablet, Take 1 tablet (10 mg total) by mouth at bedtime., Disp: 10 tablet, Rfl: 0   hydrochlorothiazide (HYDRODIURIL) 25 MG tablet, TAKE 1 TABLET BY MOUTH EVERY DAY, Disp: 90 tablet, Rfl: 3   ibuprofen (ADVIL) 200 MG tablet, Take 200 mg by mouth every 6 (six) hours as needed., Disp: , Rfl:    metFORMIN (GLUCOPHAGE) 500 MG tablet, Take 1 tablet (500 mg total) by mouth daily with breakfast., Disp: 90 tablet, Rfl: 3   mometasone (NASONEX) 50 MCG/ACT nasal spray, , Disp: , Rfl:    naproxen (NAPROSYN) 500 MG tablet, Take 1 tablet (500 mg total) by mouth 2 (two) times daily., Disp: 30 tablet, Rfl: 0   pantoprazole (PROTONIX) 40 MG tablet, Take 1 tablet (40 mg total) by mouth 2 (two) times daily., Disp: 28 tablet, Rfl: 0    Review of Systems:   ROS Negative unless otherwise specified per HPI. Vitals:   Vitals:   05/28/21 1050  BP: 140/90  Pulse: 97  Temp: 98.1 F (36.7 C)  TempSrc: Temporal  SpO2: 95%  Weight: 225 lb (102.1 kg)  Height: 5\' 7"  (1.702 m)     Body mass index is 35.24 kg/m.  Physical Exam:   Physical Exam Vitals and nursing note reviewed.  Constitutional:      General: He is not in acute distress.    Appearance: He is well-developed. He is not ill-appearing or toxic-appearing.  Cardiovascular:     Rate and Rhythm: Normal rate and regular rhythm.     Pulses: Normal pulses.  Heart sounds: Normal heart sounds, S1 normal and S2 normal.  Pulmonary:     Effort: Pulmonary effort is normal.     Breath sounds: Normal breath sounds.  Abdominal:     General: Abdomen is flat. Bowel sounds are normal.     Palpations: Abdomen is soft.     Tenderness: There is no abdominal tenderness. There is no right CVA tenderness or left CVA tenderness.  Skin:    General: Skin is warm and dry.  Neurological:     Mental Status: He is alert.     GCS: GCS eye subscore is 4. GCS verbal subscore is 5. GCS motor subscore is 6.  Psychiatric:        Speech: Speech normal.        Behavior: Behavior normal. Behavior is cooperative.    Assessment and Plan:   Enlarged prostate; Bladder wall thickening He has an appt with urology next week Will update PSA today per his request; consider pursuing sooner appointment if his PSA is significantly elevated Update UA per patient's request Follow up with urology for further evaluation   Diabetes mellitus without complication ([place]) Update HgbA1c, will adjust metformin 500 mg daily as indicated by results Encouraged patient to continue making healthy dietary choices and increase daily exercise  Hyperlipidemia associated with type 2 diabetes mellitus ([place]) Update lipid panel today, will make recommendations to lipitor 20 mg daily as indicated  I,Havlyn C  Ratchford,acting as a scribe for [employer], PA.,have documented all relevant documentation on the behalf of [name], PA,as directed by  [name], PA while in the presence of [name], [place].  I, [name], [place], have reviewed all documentation for this visit. The documentation on 05/28/21 for the exam, diagnosis, procedures, and orders are all accurate and complete.   [name], PA-C

## 2021-05-25 NOTE — ED Provider Notes (Signed)
Troy Arellano   CSN: [id number] Arrival date & time: 05/24/21  2312     History  Chief Complaint  Patient presents with   Chest Pain    Troy Arellano is a 56 y.o. male.  The history is provided by the patient and medical records.  Chest Pain Troy Arellano is a 56 y.o. male who presents to the Emergency Department complaining of chest pain.  He presents to the emergency department accompanied by his wife for evaluation of left-sided chest pain that started around 7 PM.  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  Pain is located in the left lower sternal region and is described as a sharp, pricking type sensation.  It is nonradiating.  It does come and go and last for about a minute or so at a time.  He also reports that he was seen earlier this week at urgent care for some left flank pain that is also sharp in nature.  That pain has radiated a little bit to his left side of his abdomen.  No associated fevers, nausea, vomiting, shortness of breath, diaphoresis.  No lower extremity edema.  He has a history of hypertension, hyperlipidemia, which are well controlled on medications.  He has a family history of heart disease, cerebral aneurysm, COPD.  Possible family history of blood clots in his uncles.  No tobacco, occasional alcohol, no drug use.    Home Medications Prior to Admission medications   Medication Sig Start Date End Date Taking? Authorizing Provider  pantoprazole (PROTONIX) 40 MG tablet Take 1 tablet (40 mg total) by mouth 2 (two) times daily. 05/25/21  Yes Quintella Reichert, MD  atorvastatin (LIPITOR) 20 MG tablet Take 1 tablet (20 mg total) by mouth daily. 08/23/20   [name], PA  cyclobenzaprine (FLEXERIL) 10 MG tablet Take 1 tablet (10 mg total) by mouth at bedtime. 05/17/21   Hans Eden, NP  hydrochlorothiazide (HYDRODIURIL) 25 MG tablet TAKE 1 TABLET BY MOUTH EVERY DAY 09/01/20   [name], PA  ibuprofen (ADVIL) 200 MG tablet Take 200 mg by mouth every 6 (six) hours as needed.    [provider]  metFORMIN (GLUCOPHAGE) 500 MG tablet Take 1 tablet (500 mg total) by mouth daily with breakfast. 08/23/20   [name], PA  mometasone (NASONEX) 50 MCG/ACT nasal spray SMARTSIG:1 Spray(s) Both Nares Daily PRN Patient not taking: Reported on 12/28/2020 09/04/20   [provider]  naproxen (NAPROSYN) 500 MG tablet Take 1 tablet (500 mg total) by mouth 2 (two) times daily. 05/17/21   Hans Eden, NP      Allergies    Patient has no known allergies.    Review of Systems   Review of Systems  Cardiovascular:  Positive for chest pain.  All other systems reviewed and are negative.  Physical Exam Updated Vital Signs BP 130/76    Pulse 63    Temp 97.8 F (36.6 C) (Oral)    Resp 19    Ht 5\' 7"  (1.702 m)    Wt 99.8 kg    SpO2 97%    BMI 34.46 kg/m  Physical Exam Vitals and nursing Arellano reviewed.  Constitutional:      Appearance: He is well-developed.  HENT:     Head: Normocephalic and atraumatic.  Cardiovascular:     Rate and Rhythm: Normal rate and regular rhythm.     Heart sounds: No murmur heard.  Pulmonary:     Effort: Pulmonary effort is normal. No respiratory distress.     Breath sounds: Normal breath sounds.  Abdominal:     Palpations: Abdomen is soft.     Tenderness: There is no abdominal tenderness. There is no left CVA tenderness, guarding or rebound.  Musculoskeletal:        General: No swelling or tenderness.     Comments: 2+ DP pulses bilaterally  Skin:    General: Skin is warm and dry.  Neurological:     Mental Status: He is alert and oriented to person, place, and time.  Psychiatric:        Behavior: Behavior normal.    ED Results / Procedures / Treatments   Labs (all labs ordered are listed, but only abnormal results are displayed) Labs Reviewed  BASIC METABOLIC PANEL - Abnormal; Notable for the following components:       Result Value   Potassium 3.3 (*)    Glucose, Bld 109 (*)    All other components within normal limits  URINE CULTURE  CBC  HEPATIC FUNCTION PANEL  LIPASE, BLOOD  URINALYSIS, ROUTINE W REFLEX MICROSCOPIC  TROPONIN I (HIGH SENSITIVITY)  TROPONIN I (HIGH SENSITIVITY)    EKG EKG Interpretation  Date/Time:  Thursday May 24 2021 23:19:41 EST Ventricular Rate:  72 PR Interval:  154 QRS Duration: 106 QT Interval:  407 QTC Calculation: 446 R Axis:   -71 Text Interpretation: Sinus rhythm Left anterior fascicular block Abnormal R-wave progression, late transition Confirmed by Quintella Reichert [phone]) on 05/24/2021 11:22:59 PM  Radiology DG Chest 2 View  Result Date: 05/24/2021 CLINICAL DATA:  Chest pain since 4 p.m., midsternal pain EXAM: CHEST - 2 VIEW COMPARISON:  08/13/2019 FINDINGS: Frontal and lateral views of the chest demonstrate an unremarkable cardiac silhouette. No airspace disease, effusion, or pneumothorax. No acute bony abnormality. IMPRESSION: 1. No acute intrathoracic process. Electronically Signed   By: Randa Ngo M.D.   On: 05/24/2021 23:43   CT Angio Chest/Abd/Pel for Dissection W and/or W/WO  Result Date: 05/25/2021 CLINICAL DATA:  Chest pain or back pain, aortic dissection suspected. Worsening chest pain. Hx diabetes and HTN. EXAM: CT ANGIOGRAPHY CHEST, ABDOMEN AND PELVIS TECHNIQUE: Non-contrast CT of the chest was initially obtained. Multidetector CT imaging through the chest, abdomen and pelvis was performed using the standard protocol during bolus administration of intravenous contrast. Multiplanar reconstructed images and MIPs were obtained and reviewed to evaluate the vascular anatomy. RADIATION DOSE REDUCTION: This exam was performed according to the departmental dose-optimization program which includes automated exposure control, adjustment of the mA and/or kV according to patient size and/or use of iterative reconstruction technique. CONTRAST:  115mL OMNIPAQUE  IOHEXOL 350 MG/ML SOLN COMPARISON:  None. FINDINGS: CTA CHEST FINDINGS Cardiovascular: Preferential opacification of the thoracic aorta. No evidence of thoracic aortic aneurysm or dissection. No atherosclerotic plaque of the thoracic aorta.Normal heart size. No significant pericardial effusion. No coronary artery calcifications. Artifact within the main pulmonary artery with no definite central pulmonary embolus. Mediastinum/Nodes: No enlarged mediastinal, hilar, or axillary lymph nodes. Thyroid gland, trachea, and esophagus demonstrate no significant findings. Lungs/Pleura: No focal consolidation. No pulmonary nodule. No pulmonary mass. No pleural effusion. No pneumothorax. Musculoskeletal: No chest wall abnormality. No suspicious lytic or blastic osseous lesions. No acute displaced fracture. Review of the MIP images confirms the above findings. CTA ABDOMEN AND PELVIS FINDINGS VASCULAR Aorta: Mild atherosclerotic plaque. Normal caliber aorta without aneurysm, dissection, vasculitis or significant stenosis. Celiac: Patent without evidence of aneurysm,  dissection, vasculitis or significant stenosis. SMA: Patent without evidence of aneurysm, dissection, vasculitis or significant stenosis. Renals: Both renal arteries are patent without evidence of aneurysm, dissection, vasculitis, fibromuscular dysplasia or significant stenosis. IMA: Patent without evidence of aneurysm, dissection, vasculitis or significant stenosis. Inflow: Mild atherosclerotic plaque. Patent without evidence of aneurysm, dissection, vasculitis or significant stenosis. Veins: No obvious venous abnormality within the limitations of this arterial phase study. Review of the MIP images confirms the above findings. NON-VASCULAR Hepatobiliary: There is a hyperdense 8 mm right posterior hepatic lobe lesion likely representing a flash filling hemangioma (5:86). Contracted gallbladder. No gallstones, gallbladder wall thickening, or pericholecystic fluid. No  biliary dilatation. Pancreas: No focal lesion. Normal pancreatic contour. No surrounding inflammatory changes. No main pancreatic ductal dilatation. Spleen: Normal in size without focal abnormality. Adrenals/Urinary Tract: Poorly visualized right adrenal gland. No definite nodularity of bilateral adrenal glands. Bilateral kidneys enhance symmetrically. There is a 0.9 cm fluid density lesion within left kidney likely represents a simple renal cyst. No hydronephrosis. No hydroureter. Query irregular hypodense urinary bladder wall thickening along the left anterior region (5:181). Stomach/Bowel: Stomach is within normal limits. No evidence of bowel wall thickening or dilatation. Appendix appears normal. Lymphatic: No lymphadenopathy. Reproductive: The prostate is enlarged measuring up to 5.2 cm. Other: No intraperitoneal free fluid. No intraperitoneal free gas. No organized fluid collection. Musculoskeletal: Bilateral small volume inguinal hernia on the left containing fat not on the right containing fat as well as a short loop of small bowel. Right abdominal defect of 2 cm. Left abdominal defect of 1.8 cm. Diastasis rectus. No suspicious lytic or blastic osseous lesions. No acute displaced fracture. Review of the MIP images confirms the above findings. IMPRESSION: 1. No acute vascular abnormality. 2. No acute intrathoracic abnormality. 3. Query irregular hypodense urinary bladder wall thickening. Finding of unclear etiology. Recommend correlation with urinalysis and possible direct visualization. Query inflammatory pseudotumor or cystitis. 4. Prostatomegaly. 5. Bilateral small volume inguinal hernia on the left containing fat not on the right containing fat as well as a short loop of small bowel. No associated findings to suggest ischemia. No associated bowel obstruction. Electronically Signed   By: Iven Finn M.D.   On: 05/25/2021 01:34    Procedures Procedures    Medications Ordered in ED Medications   iohexol (OMNIPAQUE) 350 MG/ML injection 100 mL (100 mLs Intravenous Contrast Given 05/25/21 0059)  alum & mag hydroxide-simeth (MAALOX/MYLANTA) 200-200-20 MG/5ML suspension 30 mL (30 mLs Oral Given 05/25/21 0231)  famotidine (PEPCID) IVPB 20 mg premix (20 mg Intravenous New Bag/Given 05/25/21 0231)    ED Course/ Medical Decision Making/ A&P                           Medical Decision Making Amount and/or Complexity of Data Reviewed Labs: ordered. Radiology: ordered.  Risk OTC drugs. Prescription drug management.   Patient with history of hypertension, hyperlipidemia here for evaluation of sharp left-sided chest pain.  He also incidentally had left flank pain earlier this week.  He has been taking Aleve for the flank pain.  He does have a family history of aneurysms and some clots.  EKG is without acute ischemic changes and troponins are negative x2.  CTA was obtained given his family history, which is negative for PE, dissection.  CT does incidentally find enlarged prostate as well as bladder wall thickening.  Given patient's flank pain and CT findings a UA was obtained, which is not consistent with  infection.  We will send a culture.  Discussed with patient findings of his CT scan and recommendation for urology follow-up.  In terms of his chest pain, suspect there may be some gastritis secondary to his recent NSAID use.  Will start PPI and recommend discontinuing Aleve at this time.  Discussed importance of PCP follow-up to have the symptoms rechecked.  Also discussed importance of urology follow-up.        Final Clinical Impression(s) / ED Diagnoses Final diagnoses:  Atypical chest pain    Rx / DC Orders ED Discharge Orders          Ordered    pantoprazole (PROTONIX) 40 MG tablet  2 times daily        05/25/21 0258              Quintella Reichert, MD 05/25/21 [phone]

## 2021-05-26 LAB — URINE CULTURE: Culture: NO GROWTH

## 2021-05-28 ENCOUNTER — Ambulatory Visit: Payer: BC Managed Care – PPO | Admitting: Physician Assistant

## 2021-05-28 ENCOUNTER — Other Ambulatory Visit: Payer: Self-pay

## 2021-05-28 ENCOUNTER — Encounter: Payer: Self-pay | Admitting: Physician Assistant

## 2021-05-28 VITALS — BP 140/90 | HR 97 | Temp 98.1°F | Ht 67.0 in | Wt 225.0 lb

## 2021-05-28 DIAGNOSIS — N400 Enlarged prostate without lower urinary tract symptoms: Secondary | ICD-10-CM

## 2021-05-28 DIAGNOSIS — E119 Type 2 diabetes mellitus without complications: Secondary | ICD-10-CM

## 2021-05-28 DIAGNOSIS — E1169 Type 2 diabetes mellitus with other specified complication: Secondary | ICD-10-CM | POA: Diagnosis not present

## 2021-05-28 DIAGNOSIS — K4021 Bilateral inguinal hernia, without obstruction or gangrene, recurrent: Secondary | ICD-10-CM

## 2021-05-28 DIAGNOSIS — E785 Hyperlipidemia, unspecified: Secondary | ICD-10-CM

## 2021-05-28 DIAGNOSIS — N3289 Other specified disorders of bladder: Secondary | ICD-10-CM | POA: Diagnosis not present

## 2021-05-28 LAB — LIPID PANEL
Cholesterol: 237 mg/dL — ABNORMAL HIGH (ref 0–200)
HDL: 45 mg/dL (ref >39.00)
LDL Cholesterol: 162 mg/dL — ABNORMAL HIGH (ref 0–99)
NonHDL: 192.19
Total CHOL/HDL Ratio: 5
Triglycerides: 152 mg/dL — ABNORMAL HIGH (ref 0.0–149.0)
VLDL: 30.4 mg/dL (ref 0.0–40.0)

## 2021-05-28 LAB — URINALYSIS, ROUTINE W REFLEX MICROSCOPIC
Bilirubin Urine: NEGATIVE
Hgb urine dipstick: NEGATIVE
Ketones, ur: NEGATIVE
Leukocytes,Ua: NEGATIVE
Nitrite: NEGATIVE
RBC / HPF: NONE SEEN (ref 0–?)
Specific Gravity, Urine: 1.015 (ref 1.000–1.030)
Total Protein, Urine: NEGATIVE
Urine Glucose: NEGATIVE
Urobilinogen, UA: 0.2 (ref 0.0–1.0)
WBC, UA: NONE SEEN (ref 0–?)
pH: 8 (ref 5.0–8.0)

## 2021-05-28 LAB — HEMOGLOBIN A1C: Hgb A1c MFr Bld: 6.2 % (ref 4.6–6.5)

## 2021-05-28 LAB — PSA: PSA: 1.96 ng/mL (ref 0.10–4.00)

## 2021-05-28 NOTE — Patient Instructions (Addendum)
It was great to see you!  We are going to update your blood work today. I will be in touch with these results.  I am putting in a referral for you to see a general surgeon to discuss and evaluate your hernias. If you develop any severe abdominal pain in the meantime, please go to the ER.  Please call them to schedule an appointment [phone] -- [facility]  Please follow-up with me after you see the urologist so we can figure out next steps for your ongoing back pain.  Take care,  [name] PA-C

## 2021-05-30 ENCOUNTER — Telehealth: Payer: Self-pay | Admitting: Physician Assistant

## 2021-05-30 NOTE — Telephone Encounter (Signed)
See result notes. 

## 2021-05-30 NOTE — Telephone Encounter (Signed)
Pt called in requesting lab results

## 2021-06-04 ENCOUNTER — Telehealth: Payer: Self-pay

## 2021-06-04 NOTE — Telephone Encounter (Signed)
Please see message. °

## 2021-06-04 NOTE — Telephone Encounter (Signed)
Tried to contact patient twice voicemail box is full unable to leave message. Letter done ready in My chart.

## 2021-06-04 NOTE — Telephone Encounter (Signed)
Patient is requesting letter to be out of work until his visit with the specialist on 2/2 for hernia.    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  Is afraid he may further injure himself.

## 2021-06-04 NOTE — Telephone Encounter (Signed)
Pt is asking to have the letter emailed to his employer.  Email: [email or web address]

## 2021-06-04 NOTE — Telephone Encounter (Signed)
Patient has called back in regard.  I have advised Troy Arellano's response and that letter has been written.  Patient is going to call back with a fax number for employer, so letter can be faxed.

## 2021-06-04 NOTE — Telephone Encounter (Signed)
Patient has called back in regard.  I have advised that we request a 3 business day turn around.  Patient states he is needing the letter for this evening.  I have advised him to speak with his employer to discuss the situation.

## 2021-06-05 NOTE — Telephone Encounter (Signed)
Duplicate

## 2021-06-05 NOTE — Telephone Encounter (Signed)
Tried to contact patient voicemail box is full. We can not e-mail letter to employer. Patient must pick up or access it through his My Chart.

## 2021-06-05 NOTE — Telephone Encounter (Signed)
If patient calls back please give him message.

## 2021-06-07 ENCOUNTER — Telehealth: Payer: Self-pay | Admitting: *Deleted

## 2021-06-07 DIAGNOSIS — K4021 Bilateral inguinal hernia, without obstruction or gangrene, recurrent: Secondary | ICD-10-CM

## 2021-06-07 NOTE — Telephone Encounter (Signed)
Pt called was seen at Urology today and thought he was seeing a surgeon for his bilateral hernia's. Told pt I thought you already had an appt with surgeon from ED visit. Pt said no was told see PCP for it. Told pt okay will put in referral for General Surgeon and someone will contact you. Pt said the letter we did is not going to work for employer he will not be able to work with hernia's due to lifting. Told pt he will need to ask his employer what he needs to do, probably need FMLA so he can be out from work till sees Psychologist, sport and exercise. Send paper work and will will fill it out. Told pt will put referral in Urgent. Pt verbalized understanding.

## 2021-06-08 ENCOUNTER — Telehealth: Payer: Self-pay | Admitting: *Deleted

## 2021-06-08 NOTE — Telephone Encounter (Signed)
Spoke to pt told him calling to give him the number for [employer] [phone] to call and schedule an appt. Tell them an urgent referral was sent over yesterday. Pt verbalized understanding.

## 2021-06-11 NOTE — Telephone Encounter (Signed)
Tried to contact pt voicemail box is full, unable to leave message.

## 2021-06-11 NOTE — Telephone Encounter (Signed)
Patient came by the office to drop of [facility] paperwork.  Patient would like a call once paperwork has been faxed and he would like to pick a copy up as well.  Place paperwork in [place].

## 2021-06-12 ENCOUNTER — Telehealth: Payer: Self-pay

## 2021-06-12 NOTE — Telephone Encounter (Signed)
We should be receiving a fax from [facility] please complete paperwork and fax it to [phone] as soon as possible.

## 2021-06-12 NOTE — Telephone Encounter (Signed)
MyChart message sent to patient.

## 2021-06-13 NOTE — Telephone Encounter (Signed)
Pt picked up FMLA paper work and dropped off [employer] to be filled out.

## 2021-06-13 NOTE — Telephone Encounter (Signed)
See other message FMLA paper work completed and pt will pick up.

## 2021-06-13 NOTE — Telephone Encounter (Signed)
Spoke to pt told him FMLA paper work for [employer] is complete do I fax to the written number on the front, and do you want a copy? Pt verbalized understanding and said yes fax to number written on the paper I will come by tomorrow to pick up copy. Asked pt you have an appt on 2/15 next week with surgeon. Pt said yes. Told him okay will put copy up front for you. Pt verbalized understanding.

## 2021-06-13 NOTE — Telephone Encounter (Signed)
Spoke to pt told him I have the paper work he dropped off finished. I can not fax it cause you did not sign it. Also I did not receive fax from [facility]. Pt verbalized understanding and will come by and pickup paperwork that is complete and fax himself and drop off other paper. Told him okay I will place paperwork up front for pick up. Pt verbalized understanding.

## 2021-06-13 NOTE — Telephone Encounter (Signed)
Please call patient back in regard.  °

## 2021-06-13 NOTE — Telephone Encounter (Signed)
Tried to contact pt voicemail box full, unable to leave message.

## 2021-06-21 ENCOUNTER — Telehealth: Payer: Self-pay | Admitting: Physician Assistant

## 2021-06-21 NOTE — Telephone Encounter (Signed)
Patient Name: Troy Arellano 42 Gender: Male DOB: [birth year] Age: 56 Y 11 M 3 D Return Phone Number: [phone] (Primary) Address: City/ State/ Zip: [place]  [address] Client [facility] [place] Client Site [facility] [place] Day Provider Morene Rankins, [place]- [place] Contact Type Call Who Is Calling Patient / Member / Family / Caregiver Call Type Triage / Clinical Relationship To Patient Self Return Phone Number [phone] (Primary) Chief Complaint Abdominal Pain Reason for Call Symptomatic / Request for [place] states that he is having left side abdominal and groin pain where he has a hernia. He just seen the surgeon yesterday for this as well. Translation No Disp. Time Eilene Ghazi Time) Disposition Final User 06/21/2021 9:39:59 AM Attempt made - no message left Lovelace, RN, Amy 06/21/2021 9:57:06 AM Send To RN Personal Lovelace, RN, Amy 06/21/2021 10:06:50 AM Attempt made - no message left Dew, RN, Levada Dy 06/21/2021 10:32:43 AM FINAL ATTEMPT MADE - no message left Yes Dew, RN, Levada Dy Comments User: Wayne Sever, RN Date/Time Eilene Ghazi Time): 06/21/2021 9:39:55 AM Mailbox is full. Unable to leave a message.

## 2021-06-21 NOTE — Telephone Encounter (Signed)
Sending patient back to triage.

## 2021-06-21 NOTE — Telephone Encounter (Signed)
Patient called complaining of extreme left side pain (hernia location)- patient has been triaged by [facility]. Awaiting triage notes.

## 2021-06-22 ENCOUNTER — Encounter (HOSPITAL_COMMUNITY): Payer: Self-pay | Admitting: Emergency Medicine

## 2021-06-22 ENCOUNTER — Emergency Department (HOSPITAL_COMMUNITY)
Admission: EM | Admit: 2021-06-22 | Discharge: 2021-06-22 | Disposition: A | Discharge status: Home / Self Care | Payer: BC Managed Care – PPO | Attending: Emergency Medicine | Admitting: Emergency Medicine

## 2021-06-22 DIAGNOSIS — R1030 Lower abdominal pain, unspecified: Secondary | ICD-10-CM

## 2021-06-22 DIAGNOSIS — E119 Type 2 diabetes mellitus without complications: Secondary | ICD-10-CM | POA: Diagnosis not present

## 2021-06-22 DIAGNOSIS — I10 Essential (primary) hypertension: Secondary | ICD-10-CM | POA: Insufficient documentation

## 2021-06-22 DIAGNOSIS — Z7984 Long term (current) use of oral hypoglycemic drugs: Secondary | ICD-10-CM | POA: Diagnosis not present

## 2021-06-22 DIAGNOSIS — Z79899 Other long term (current) drug therapy: Secondary | ICD-10-CM | POA: Diagnosis not present

## 2021-06-22 DIAGNOSIS — K4021 Bilateral inguinal hernia, without obstruction or gangrene, recurrent: Secondary | ICD-10-CM | POA: Diagnosis not present

## 2021-06-22 MED ORDER — OXYCODONE-ACETAMINOPHEN 5-325 MG PO TABS: 1.0000 | ORAL_TABLET | Freq: Four times a day (QID) | ORAL | 0 refills | Status: DC | PRN

## 2021-06-22 NOTE — ED Triage Notes (Signed)
Pt here for known groin pain that he is being seen for at [facility] and will have surgery to have repaired , pt has been taking 400mg  ibuprofen without relief and is asking for something stronger until surgery

## 2021-06-22 NOTE — ED Provider Notes (Signed)
[facility] EMERGENCY DEPARTMENT Provider Note   CSN: [id number] Arrival date & time: 06/22/21  6644     History  No chief complaint on file.   Troy Arellano is a 56 y.o. male.  Patient is a 56 year old male with a history of hypertension, diabetes, bilateral inguinal hernias who is presenting today with complaints of lower abdominal pain.  Patient reports that this week he has had more pain in his hernia area than he has ever had before.  He initially on Tuesday was having pain in the left groin which would radiate down into the leg and was made worse with any type of lifting.  However now he reports is moving from left to right and feels like a pins sticking him.  It is worse with getting up and moving around.  He does report that yesterday he had a procedure done on his prostate where they put a camera in the area and a metal tube and since last night the pain has been much worse.  He denies any nausea, vomiting, change in bowel movements.  He has been able to urinate without difficulty and denies any fever.  He has been trying to take ibuprofen at home but reports its just not helping the pain.  The history is provided by the patient and medical records.      Home Medications Prior to Admission medications   Medication Sig Start Date End Date Taking? Authorizing Provider  oxyCODONE-acetaminophen (PERCOCET/ROXICET) 5-325 MG tablet Take 1 tablet by mouth every 6 (six) hours as needed for severe pain. 06/22/21  Yes Onda Kattner, Loree Fee, MD  atorvastatin (LIPITOR) 20 MG tablet Take 1 tablet (20 mg total) by mouth daily. 08/23/20   [name], PA  cyclobenzaprine (FLEXERIL) 10 MG tablet Take 1 tablet (10 mg total) by mouth at bedtime. 05/17/21   Hans Eden, NP  hydrochlorothiazide (HYDRODIURIL) 25 MG tablet TAKE 1 TABLET BY MOUTH EVERY DAY 09/01/20   [name], PA  ibuprofen (ADVIL) 200 MG tablet Take 200 mg by mouth every 6 (six) hours as needed.    [provider]  metFORMIN (GLUCOPHAGE) 500 MG tablet Take 1 tablet (500 mg total) by mouth daily with breakfast. 08/23/20   [name], PA  mometasone (NASONEX) 50 MCG/ACT nasal spray  09/04/20   [provider]  naproxen (NAPROSYN) 500 MG tablet Take 1 tablet (500 mg total) by mouth 2 (two) times daily. 05/17/21   White, Leitha Schuller, NP  pantoprazole (PROTONIX) 40 MG tablet Take 1 tablet (40 mg total) by mouth 2 (two) times daily. 05/25/21   Quintella Reichert, MD      Allergies    Patient has no known allergies.    Review of Systems   Review of Systems  Physical Exam Updated Vital Signs BP 125/82 (BP Location: Right Arm)    Pulse 80    Temp 97.8 F (36.6 C) (Oral)    Resp 20    SpO2 98%  Physical Exam Vitals and nursing note reviewed.  Constitutional:      General: He is not in acute distress.    Appearance: He is well-developed.  HENT:     Head: Normocephalic and atraumatic.  Eyes:     Conjunctiva/sclera: Conjunctivae normal.     Pupils: Pupils are equal, round, and reactive to light.  Cardiovascular:     Rate and Rhythm: Normal rate and regular rhythm.     Heart sounds: No murmur heard. Pulmonary:  Effort: Pulmonary effort is normal. No respiratory distress.     Breath sounds: Normal breath sounds. No wheezing or rales.  Abdominal:     General: There is no distension.     Palpations: Abdomen is soft.     Tenderness: There is abdominal tenderness. There is no right CVA tenderness, left CVA tenderness, guarding or rebound.     Comments: Mild tenderness in the suprapubic area.  Mild tenderness noted over the right and inguinal area.  Soft reducible hernia on both the right and left.  No testicular involvement.  Musculoskeletal:        General: No tenderness. Normal range of motion.     Cervical back: Normal range of motion and neck supple.  Skin:    General: Skin is warm and dry.     Findings: No erythema or rash.  Neurological:     Mental Status: He is alert  and oriented to person, place, and time. Mental status is at baseline.  Psychiatric:        Mood and Affect: Mood normal.        Behavior: Behavior normal.    ED Results / Procedures / Treatments   Labs (all labs ordered are listed, but only abnormal results are displayed) Labs Reviewed - No data to display  EKG None  Radiology No results found.  Procedures Procedures    Medications Ordered in ED Medications - No data to display  ED Course/ Medical Decision Making/ A&P                           Medical Decision Making  Patient is a 57 year old male presenting today with lower abdominal pain and known history of bilateral inguinal hernias.  External medical records were reviewed from patient's recent surgical visit and he had a CT done at the end of January that showed bilateral inguinal hernias.  There is no other acute pathology at that time.  Patient does not have incarcerated hernias today to suggest an acute emergent abdominal process.  He is having some mild pain in the suprapubic area but denies any urinary symptoms.  Patient also had a prostate procedure done yesterday which may be adding into his pain.  He is nontoxic-appearing, has no incarcerated hernias today and denies any urinary symptoms.  He has tried ibuprofen without improvement.  Discussed with him that did not feel he needed a CAT scan today based on no evidence of incarceration and do not feel that he has an acute surgical process.  He is in agreement with no CAT scan today but will give pain medication that he can take as needed.  Low suspicion for UTI today but again it may have some discomfort from the procedure he had done yesterday on his prostate.  Patient was given return precautions.  He is waiting for a call today for his hernia surgery.  Cautioned him to not do any heavy lifting and patient is currently out of work until he has the surgery.  No indication for admission at this time.        Final  Clinical Impression(s) / ED Diagnoses Final diagnoses:  Lower abdominal pain  Bilateral recurrent inguinal hernia without obstruction or gangrene    Rx / DC Orders ED Discharge Orders          Ordered    oxyCODONE-acetaminophen (PERCOCET/ROXICET) 5-325 MG tablet  Every 6 hours PRN        06/22/21 0947  Blanchie Dessert, MD 06/22/21 [phone]

## 2021-06-22 NOTE — Telephone Encounter (Signed)
See Triage note, Pt is in the ED.

## 2021-06-22 NOTE — Telephone Encounter (Signed)
Pt is currently at the ED   Patient Name: Troy Arellano Gender: Male DOB: 1965-09-08 Age: 56 Y 11 M 3 D Return Phone Number: [phone] (Primary) Address: City/ State/ Zip: [place]  [address] Client [facility] [place] Client Site [facility] [place] Day Provider Morene Rankins, [place]- [place] Contact Type Call Who Is Calling Patient / Member / Family / Caregiver Call Type Triage / Clinical Relationship To Patient Self Return Phone Number [phone] (Primary) Chief Complaint SEVERE ABDOMINAL PAIN - Severe pain in abdomen Reason for Call Symptomatic / Request for [place] states he is having sharp pains in between his leg an groining. He states the pain in mostly on his left side and it feels like someone is stabbing him. He said he has 2 hernias' and is also having abdominal pain. He would like to have something for the pain. Translation No Nurse Assessment Nurse: Radford Pax, RN, Eugene Garnet Date/Time (Eastern Time): 06/21/2021 5:08:15 PM Confirm and document reason for call. If symptomatic, describe symptoms. ---Has hernia in both groins. Is having pain in left groin. Does the patient have any new or worsening symptoms? ---Yes Will a triage be completed? ---Yes Related visit to physician within the last 2 weeks? ---No Does the PT have any chronic conditions? (i.e. diabetes, asthma, this includes High risk factors for pregnancy, etc.) ---No Is this a behavioral health or substance abuse call? ---No Guidelines Guideline Title Affirmed Question Affirmed Notes Nurse Date/Time (Eastern Time) Hernia Hernia is painful or tender to touch Turner, RN, Eugene Garnet 06/21/2021 5:09:29 PM Disp. Time Eilene Ghazi Time) Disposition Final User 06/21/2021 5:06:16 PM Send to Urgent Queue Lisette Abu 06/21/2021 5:11:37 PM Go to ED Now Yes Radford Pax, RN, Eugene Garnet Caller Disagree/Comply Comply Caller  Understands Yes PreDisposition Call Doctor Care Advice Given Per Guideline GO TO ED NOW: * You need to be seen in the Emergency Department. NOTHING BY MOUTH: * Do not eat or drink anything for now. CARE ADVICE given per Hernia (Adult) guideline Referrals [facility] - ED

## 2021-06-22 NOTE — Discharge Instructions (Signed)
Your pain today may be partially from your hernias but also could be from the prostate procedure you had done yesterday.  Use the pain medication as needed for severe pain but if ibuprofen is keeping the pain under control then you do not need to take it.  If you start having vomiting, no bowel movements, fever, inability to urinate you should return to the emergency room.

## 2021-07-02 ENCOUNTER — Ambulatory Visit: Payer: Self-pay | Admitting: Surgery

## 2021-07-17 ENCOUNTER — Other Ambulatory Visit: Payer: Self-pay

## 2021-07-17 ENCOUNTER — Encounter (HOSPITAL_BASED_OUTPATIENT_CLINIC_OR_DEPARTMENT_OTHER): Payer: Self-pay | Admitting: Surgery

## 2021-07-17 NOTE — Progress Notes (Signed)
Spoke w/ via phone for pre-op interview--- pt ?Lab needs dos----  istat             ?Lab results------ current ekg in epic/ chart ?COVID test -----patient states asymptomatic no test needed ?Arrive at ------- 0900 on 07-20-2021 ?NPO after MN NO Solid Food.  Clear liquids from MN until--- 0800 ?Med rec completed ?Medications to take morning of surgery ----- protonix ?Diabetic medication ----- do not take metformin morning of surgery ?Patient instructed no nail polish to be worn day of surgery ?Patient instructed to bring photo id and insurance card day of surgery ?Patient aware to have Driver (ride ) / caregiver for 24 hours after surgery --wife, valerie ?Patient Special Instructions ----- n/a ?Pre-Op special Istructions ----- n/a ?Patient verbalized understanding of instructions that were given at this phone interview. ?Patient denies shortness of breath, chest pain, fever, cough at this phone interview.  ?

## 2021-07-20 ENCOUNTER — Other Ambulatory Visit: Payer: Self-pay

## 2021-07-20 ENCOUNTER — Ambulatory Visit (HOSPITAL_BASED_OUTPATIENT_CLINIC_OR_DEPARTMENT_OTHER): Payer: BC Managed Care – PPO | Admitting: Anesthesiology

## 2021-07-20 ENCOUNTER — Encounter (HOSPITAL_BASED_OUTPATIENT_CLINIC_OR_DEPARTMENT_OTHER): Payer: Self-pay | Admitting: Surgery

## 2021-07-20 ENCOUNTER — Encounter (HOSPITAL_BASED_OUTPATIENT_CLINIC_OR_DEPARTMENT_OTHER)
Admission: RE | Disposition: A | Discharge status: Home / Self Care | Payer: Self-pay | Source: Home / Self Care | Attending: Surgery

## 2021-07-20 ENCOUNTER — Ambulatory Visit (HOSPITAL_BASED_OUTPATIENT_CLINIC_OR_DEPARTMENT_OTHER)
Admission: RE | Admit: 2021-07-20 | Discharge: 2021-07-20 | Disposition: A | Discharge status: Home / Self Care | Payer: BC Managed Care – PPO | Attending: Surgery | Admitting: Surgery

## 2021-07-20 DIAGNOSIS — E119 Type 2 diabetes mellitus without complications: Secondary | ICD-10-CM | POA: Insufficient documentation

## 2021-07-20 DIAGNOSIS — Z7984 Long term (current) use of oral hypoglycemic drugs: Secondary | ICD-10-CM | POA: Insufficient documentation

## 2021-07-20 DIAGNOSIS — Z79899 Other long term (current) drug therapy: Secondary | ICD-10-CM | POA: Insufficient documentation

## 2021-07-20 DIAGNOSIS — K4021 Bilateral inguinal hernia, without obstruction or gangrene, recurrent: Secondary | ICD-10-CM | POA: Insufficient documentation

## 2021-07-20 DIAGNOSIS — M1990 Unspecified osteoarthritis, unspecified site: Secondary | ICD-10-CM | POA: Diagnosis not present

## 2021-07-20 DIAGNOSIS — K219 Gastro-esophageal reflux disease without esophagitis: Secondary | ICD-10-CM | POA: Diagnosis not present

## 2021-07-20 DIAGNOSIS — I10 Essential (primary) hypertension: Secondary | ICD-10-CM | POA: Insufficient documentation

## 2021-07-20 HISTORY — PX: INGUINAL HERNIA REPAIR: SHX194

## 2021-07-20 HISTORY — DX: Benign prostatic hyperplasia with lower urinary tract symptoms: N40.1

## 2021-07-20 HISTORY — DX: Gastro-esophageal reflux disease without esophagitis: K21.9

## 2021-07-20 HISTORY — DX: Hyperlipidemia, unspecified: E78.5

## 2021-07-20 HISTORY — DX: Personal history of colonic polyps: Z86.010

## 2021-07-20 HISTORY — DX: Type 2 diabetes mellitus without complications: E11.9

## 2021-07-20 HISTORY — DX: Bilateral inguinal hernia, without obstruction or gangrene, recurrent: K40.21

## 2021-07-20 HISTORY — DX: Personal history of adenomatous and serrated colon polyps: Z86.0101

## 2021-07-20 LAB — POCT I-STAT, CHEM 8
BUN: 14 mg/dL (ref 6–20)
Calcium, Ion: 1.2 mmol/L (ref 1.15–1.40)
Chloride: 101 mmol/L (ref 98–111)
Creatinine, Ser: 0.9 mg/dL (ref 0.61–1.24)
Glucose, Bld: 100 mg/dL — ABNORMAL HIGH (ref 70–99)
HCT: 46 % (ref 39.0–52.0)
Hemoglobin: 15.6 g/dL (ref 13.0–17.0)
Potassium: 4.5 mmol/L (ref 3.5–5.1)
Sodium: 138 mmol/L (ref 135–145)
TCO2: 30 mmol/L (ref 22–32)

## 2021-07-20 LAB — GLUCOSE, CAPILLARY: Glucose-Capillary: 98 mg/dL (ref 70–99)

## 2021-07-20 SURGERY — REPAIR, HERNIA, INGUINAL, BILATERAL, LAPAROSCOPIC
Anesthesia: General | Site: Abdomen | Laterality: Bilateral

## 2021-07-20 MED ORDER — LACTATED RINGERS IV SOLN: INTRAVENOUS | Status: DC

## 2021-07-20 MED ORDER — ENOXAPARIN SODIUM 40 MG/0.4ML IJ SOSY
40.0000 mg | PREFILLED_SYRINGE | Freq: Once | INTRAMUSCULAR | Status: AC
  Administered 2021-07-20: 40 mg via SUBCUTANEOUS

## 2021-07-20 MED ORDER — ONDANSETRON HCL 4 MG/2ML IJ SOLN
INTRAMUSCULAR | Status: AC
  Filled 2021-07-20: qty 2

## 2021-07-20 MED ORDER — LIDOCAINE HCL (PF) 2 % IJ SOLN
INTRAMUSCULAR | Status: AC
  Filled 2021-07-20: qty 10

## 2021-07-20 MED ORDER — PROPOFOL 10 MG/ML IV BOLUS
INTRAVENOUS | Status: AC
Start: 2021-07-20 — End: ?
  Filled 2021-07-20: qty 20

## 2021-07-20 MED ORDER — ONDANSETRON HCL 4 MG/2ML IJ SOLN
INTRAMUSCULAR | Status: DC | PRN
  Administered 2021-07-20: 4 mg via INTRAVENOUS

## 2021-07-20 MED ORDER — BUPIVACAINE LIPOSOME 1.3 % IJ SUSP
INTRAMUSCULAR | Status: DC | PRN
Start: 2021-07-20 — End: 2021-07-20
  Administered 2021-07-20: 20 mL

## 2021-07-20 MED ORDER — ENOXAPARIN SODIUM 40 MG/0.4ML IJ SOSY
PREFILLED_SYRINGE | INTRAMUSCULAR | Status: AC
  Filled 2021-07-20: qty 0.4

## 2021-07-20 MED ORDER — PHENYLEPHRINE HCL-NACL 20-0.9 MG/250ML-% IV SOLN
INTRAVENOUS | Status: DC | PRN
  Administered 2021-07-20: 30 ug/min via INTRAVENOUS

## 2021-07-20 MED ORDER — SODIUM CHLORIDE 0.9 % IR SOLN
Status: DC | PRN
  Administered 2021-07-20: 500 mL

## 2021-07-20 MED ORDER — PHENYLEPHRINE 40 MCG/ML (10ML) SYRINGE FOR IV PUSH (FOR BLOOD PRESSURE SUPPORT)
PREFILLED_SYRINGE | INTRAVENOUS | Status: AC
  Filled 2021-07-20: qty 20

## 2021-07-20 MED ORDER — OXYCODONE HCL 5 MG PO TABS
5.0000 mg | ORAL_TABLET | Freq: Once | ORAL | Status: AC
Start: 2021-07-20 — End: 2021-07-20
  Administered 2021-07-20: 5 mg via ORAL

## 2021-07-20 MED ORDER — SUGAMMADEX SODIUM 200 MG/2ML IV SOLN
INTRAVENOUS | Status: DC | PRN
Start: 2021-07-20 — End: 2021-07-20
  Administered 2021-07-20: 200 mg via INTRAVENOUS
  Administered 2021-07-20: 100 mg via INTRAVENOUS

## 2021-07-20 MED ORDER — DEXAMETHASONE SODIUM PHOSPHATE 10 MG/ML IJ SOLN
INTRAMUSCULAR | Status: DC | PRN
  Administered 2021-07-20: 5 mg via INTRAVENOUS

## 2021-07-20 MED ORDER — CELECOXIB 200 MG PO CAPS
400.0000 mg | ORAL_CAPSULE | ORAL | Status: AC
Start: 2021-07-20 — End: 2021-07-20
  Administered 2021-07-20: 400 mg via ORAL

## 2021-07-20 MED ORDER — PHENYLEPHRINE HCL (PRESSORS) 10 MG/ML IV SOLN
INTRAVENOUS | Status: AC
  Filled 2021-07-20: qty 1

## 2021-07-20 MED ORDER — GABAPENTIN 300 MG PO CAPS
ORAL_CAPSULE | ORAL | Status: AC
  Filled 2021-07-20: qty 1

## 2021-07-20 MED ORDER — ROCURONIUM BROMIDE 10 MG/ML (PF) SYRINGE
PREFILLED_SYRINGE | INTRAVENOUS | Status: AC
  Filled 2021-07-20: qty 10

## 2021-07-20 MED ORDER — ACETAMINOPHEN 500 MG PO TABS
1000.0000 mg | ORAL_TABLET | ORAL | Status: AC
  Administered 2021-07-20: 1000 mg via ORAL

## 2021-07-20 MED ORDER — SUCCINYLCHOLINE CHLORIDE 200 MG/10ML IV SOSY
PREFILLED_SYRINGE | INTRAVENOUS | Status: AC
  Filled 2021-07-20: qty 10

## 2021-07-20 MED ORDER — CELECOXIB 200 MG PO CAPS
ORAL_CAPSULE | ORAL | Status: AC
  Filled 2021-07-20: qty 2

## 2021-07-20 MED ORDER — FENTANYL CITRATE (PF) 100 MCG/2ML IJ SOLN
INTRAMUSCULAR | Status: DC | PRN
  Administered 2021-07-20: 50 ug via INTRAVENOUS
  Administered 2021-07-20: 100 ug via INTRAVENOUS

## 2021-07-20 MED ORDER — MIDAZOLAM HCL 5 MG/5ML IJ SOLN
INTRAMUSCULAR | Status: DC | PRN
  Administered 2021-07-20: 2 mg via INTRAVENOUS

## 2021-07-20 MED ORDER — ROCURONIUM BROMIDE 10 MG/ML (PF) SYRINGE
PREFILLED_SYRINGE | INTRAVENOUS | Status: DC | PRN
  Administered 2021-07-20: 100 mg via INTRAVENOUS

## 2021-07-20 MED ORDER — CHLORHEXIDINE GLUCONATE CLOTH 2 % EX PADS: 6.0000 | MEDICATED_PAD | Freq: Once | CUTANEOUS | Status: DC

## 2021-07-20 MED ORDER — LIDOCAINE 2% (20 MG/ML) 5 ML SYRINGE
INTRAMUSCULAR | Status: DC | PRN
  Administered 2021-07-20: 60 mg via INTRAVENOUS

## 2021-07-20 MED ORDER — BUPIVACAINE LIPOSOME 1.3 % IJ SUSP: 20.0000 mL | Freq: Once | INTRAMUSCULAR | Status: DC

## 2021-07-20 MED ORDER — GLYCOPYRROLATE 0.2 MG/ML IJ SOLN
INTRAMUSCULAR | Status: DC | PRN
  Administered 2021-07-20: .2 mg via INTRAVENOUS

## 2021-07-20 MED ORDER — PHENYLEPHRINE 40 MCG/ML (10ML) SYRINGE FOR IV PUSH (FOR BLOOD PRESSURE SUPPORT)
PREFILLED_SYRINGE | INTRAVENOUS | Status: DC | PRN
  Administered 2021-07-20 (×2): 120 ug via INTRAVENOUS
  Administered 2021-07-20 (×4): 80 ug via INTRAVENOUS

## 2021-07-20 MED ORDER — CEFAZOLIN SODIUM-DEXTROSE 2-4 GM/100ML-% IV SOLN
INTRAVENOUS | Status: AC
  Filled 2021-07-20: qty 100

## 2021-07-20 MED ORDER — BUPIVACAINE HCL 0.5 % IJ SOLN
INTRAMUSCULAR | Status: DC | PRN
  Administered 2021-07-20: 30 mL

## 2021-07-20 MED ORDER — MIDAZOLAM HCL 2 MG/2ML IJ SOLN
INTRAMUSCULAR | Status: AC
  Filled 2021-07-20: qty 2

## 2021-07-20 MED ORDER — DEXAMETHASONE SODIUM PHOSPHATE 10 MG/ML IJ SOLN
INTRAMUSCULAR | Status: AC
  Filled 2021-07-20: qty 1

## 2021-07-20 MED ORDER — ACETAMINOPHEN 500 MG PO TABS
ORAL_TABLET | ORAL | Status: AC
  Filled 2021-07-20: qty 2

## 2021-07-20 MED ORDER — GABAPENTIN 300 MG PO CAPS
300.0000 mg | ORAL_CAPSULE | ORAL | Status: AC
  Administered 2021-07-20: 300 mg via ORAL

## 2021-07-20 MED ORDER — OXYCODONE-ACETAMINOPHEN 5-325 MG PO TABS: 1.0000 | ORAL_TABLET | ORAL | 0 refills | Status: DC | PRN

## 2021-07-20 MED ORDER — CEFAZOLIN SODIUM-DEXTROSE 2-4 GM/100ML-% IV SOLN
2.0000 g | INTRAVENOUS | Status: AC
  Administered 2021-07-20: 2 g via INTRAVENOUS

## 2021-07-20 MED ORDER — OXYCODONE HCL 5 MG PO TABS
ORAL_TABLET | ORAL | Status: AC
  Filled 2021-07-20: qty 1

## 2021-07-20 MED ORDER — FENTANYL CITRATE (PF) 250 MCG/5ML IJ SOLN
INTRAMUSCULAR | Status: AC
  Filled 2021-07-20: qty 5

## 2021-07-20 MED ORDER — PROPOFOL 10 MG/ML IV BOLUS
INTRAVENOUS | Status: DC | PRN
  Administered 2021-07-20: 200 mg via INTRAVENOUS

## 2021-07-20 SURGICAL SUPPLY — 41 items
ADH SKN CLS APL DERMABOND .7 (GAUZE/BANDAGES/DRESSINGS) ×1
APL PRP STRL LF DISP 70% ISPRP (MISCELLANEOUS) ×1
BLADE CLIPPER SENSICLIP SURGIC (BLADE) ×1 IMPLANT
CABLE HIGH FREQUENCY MONO STRZ (ELECTRODE) ×2 IMPLANT
CHLORAPREP W/TINT 26 (MISCELLANEOUS) ×2 IMPLANT
DECANTER SPIKE VIAL GLASS SM (MISCELLANEOUS) IMPLANT
DERMABOND ADVANCED (GAUZE/BANDAGES/DRESSINGS) ×1
DERMABOND ADVANCED .7 DNX12 (GAUZE/BANDAGES/DRESSINGS) ×1 IMPLANT
ELECT REM PT RETURN 9FT ADLT (ELECTROSURGICAL) ×2
ELECTRODE REM PT RTRN 9FT ADLT (ELECTROSURGICAL) ×1 IMPLANT
GAUZE 4X4 16PLY ~~LOC~~+RFID DBL (SPONGE) ×2 IMPLANT
GLOVE SRG 8 PF TXTR STRL LF DI (GLOVE) ×1 IMPLANT
GLOVE SURG ENC MOIS LTX SZ7.5 (GLOVE) ×2 IMPLANT
GLOVE SURG UNDER POLY LF SZ8 (GLOVE) ×2
GOWN STRL REUS W/ TWL XL LVL3 (GOWN DISPOSABLE) ×1 IMPLANT
GOWN STRL REUS W/TWL LRG LVL3 (GOWN DISPOSABLE) ×2 IMPLANT
GOWN STRL REUS W/TWL XL LVL3 (GOWN DISPOSABLE) ×2
GRASPER SUT TROCAR 14GX15 (MISCELLANEOUS) ×2 IMPLANT
IRRIG SUCT STRYKERFLOW 2 WTIP (MISCELLANEOUS)
IRRIGATION SUCT STRKRFLW 2 WTP (MISCELLANEOUS) IMPLANT
KIT TURNOVER CYSTO (KITS) ×2 IMPLANT
MESH 3DMAX 5X7 LT XLRG (Mesh General) ×1 IMPLANT
MESH 3DMAX 5X7 RT XLRG (Mesh General) ×1 IMPLANT
NEEDLE INSUFFLATION 120MM (ENDOMECHANICALS) ×2 IMPLANT
PACK BASIN DAY SURGERY FS (CUSTOM PROCEDURE TRAY) ×2 IMPLANT
RELOAD STAPLE 4.0 BLU F/HERNIA (INSTRUMENTS) IMPLANT
RELOAD STAPLE 4.8 BLK F/HERNIA (STAPLE) IMPLANT
RELOAD STAPLE HERNIA 4.0 BLUE (INSTRUMENTS) IMPLANT
RELOAD STAPLE HERNIA 4.8 BLK (STAPLE) ×2 IMPLANT
SCISSORS LAP 5X35 DISP (ENDOMECHANICALS) ×2 IMPLANT
SET TUBE SMOKE EVAC HIGH FLOW (TUBING) ×2 IMPLANT
SPONGE T-LAP 18X18 ~~LOC~~+RFID (SPONGE) IMPLANT
STAPLER HERNIA 12 8.5 360D (INSTRUMENTS) ×1 IMPLANT
SUT MNCRL AB 4-0 PS2 18 (SUTURE) ×3 IMPLANT
SUT VICRYL 0 UR6 27IN ABS (SUTURE) IMPLANT
TOWEL OR 17X26 10 PK STRL BLUE (TOWEL DISPOSABLE) ×2 IMPLANT
TRAY FOL W/BAG SLVR 16FR STRL (SET/KITS/TRAYS/PACK) IMPLANT
TRAY FOLEY W/BAG SLVR 16FR LF (SET/KITS/TRAYS/PACK) ×2
TRAY LAPAROSCOPIC (CUSTOM PROCEDURE TRAY) ×2 IMPLANT
TROCAR BLADELESS OPT 12M 100M (ENDOMECHANICALS) ×2 IMPLANT
TROCAR BLADELESS OPT 5 100 (ENDOMECHANICALS) ×4 IMPLANT

## 2021-07-20 NOTE — Discharge Instructions (Addendum)
?  Post Anesthesia Home Care Instructions ? ?Activity: ?Get plenty of rest for the remainder of the day. A responsible individual must stay with you for 24 hours following the procedure.  ?For the next 24 hours, DO NOT: ?-Drive a car ?-Paediatric nurse ?-Drink alcoholic beverages ?-Take any medication unless instructed by your physician ?-Make any legal decisions or sign important papers. ? ?Meals: ?Start with liquid foods such as gelatin or soup. Progress to regular foods as tolerated. Avoid greasy, spicy, heavy foods. If nausea and/or vomiting occur, drink only clear liquids until the nausea and/or vomiting subsides. Call your physician if vomiting continues. ? ?Special Instructions/Symptoms: ?Your throat may feel dry or sore from the anesthesia or the breathing tube placed in your throat during surgery. If this causes discomfort, gargle with warm salt water. The discomfort should disappear within 24 hours. ? ?If you had a scopolamine patch placed behind your ear for the management of post- operative nausea and/or vomiting: ? ?1. The medication in the patch is effective for 72 hours, after which it should be removed.  Wrap patch in a tissue and discard in the trash. Wash hands thoroughly with soap and water. ?2. You may remove the patch earlier than 72 hours if you experience unpleasant side effects which may include dry mouth, dizziness or visual disturbances. ?3. Avoid touching the patch. Wash your hands with soap and water after contact with the patch. ?    ? ?No Tylenol/acetaminophen products until 3:45pm or after. ?No Advil/Motrin/ibuprofen products until 3:45pm or after. ? ?Information for Discharge Teaching: ?EXPAREL (bupivacaine liposome injectable suspension)  ? ?Your surgeon or anesthesiologist gave you EXPAREL(bupivacaine) to help control your pain after surgery.  ?EXPAREL is a local anesthetic that provides pain relief by numbing the tissue around the surgical site. ?EXPAREL is designed to release pain  medication over time and can control pain for up to 72 hours. ?Depending on how you respond to EXPAREL, you may require less pain medication during your recovery. ? ?Possible side effects: ?Temporary loss of sensation or ability to move in the area where bupivacaine was injected. ?Nausea, vomiting, constipation ?Rarely, numbness and tingling in your mouth or lips, lightheadedness, or anxiety may occur. ?Call your doctor right away if you think you may be experiencing any of these sensations, or if you have other questions regarding possible side effects. ? ?Follow all other discharge instructions given to you by your surgeon or nurse. Eat a healthy diet and drink plenty of water or other fluids. ? ?If you return to the hospital for any reason within 96 hours following the administration of EXPAREL, it is important for health care providers to know that you have received this anesthetic. A teal colored band has been placed on your arm with the date, time and amount of EXPAREL you have received in order to alert and inform your health care providers. Please leave this armband in place for the full 96 hours following administration, and then you may remove the band.  ? ? ?

## 2021-07-20 NOTE — H&P (Signed)
? ?Admitting Physician: Nickola Major Madalina Rosman ? ?Service: General surgery ? ?CC: Hernia ? ?Subjective  ? ?HPI: ?Troy Arellano is an 56 y.o. male who is here for elective laparoscopic recurrent bilateral inguinal hernia repair. ? ?Troy Arellano had inguinal hernias fixed years ago. He is noted some pain discomfort in his groins as he is doing his work. The pain is worse when he is doing heavy lifting. 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 ? ?Past Medical History:  ?Diagnosis Date  ? Arthritis   ? Back  ? Benign localized prostatic hyperplasia with lower urinary tract symptoms (LUTS)   ? urologist--- dr Cain Sieve  ? Bilateral recurrent inguinal hernia   ? GERD (gastroesophageal reflux disease)   ? History of adenomatous polyp of colon   ? History of COVID-19 05/2020  ? per pt mild symptoms that resovled  ? Hyperlipidemia   ? Hypertension   ? followed by pcp  ? Type 2 diabetes mellitus ([place])   ? followed by pcp   (07-17-2021  per pt does not take check blood sugar )  ? ? ?Past Surgical History:  ?Procedure Laterality Date  ? COLONOSCOPY  12/28/2020  ? by dr h. danis  ? INGUINAL HERNIA REPAIR Bilateral   ? right 1987 and left 1988  ? ? ?Family History  ?Problem Relation Age of Onset  ? Hypertension Mother   ? Hypertension Father   ? Heart failure Father   ? Lymphoma Sister   ? Colon cancer Neg Hx   ? Esophageal cancer Neg Hx   ? Rectal cancer Neg Hx   ? Stomach cancer Neg Hx   ? Colon polyps Neg Hx   ? ? ?Social:  reports that he has never smoked. He has never used smokeless tobacco. He reports current alcohol use of about 2.0 - 3.0 standard drinks per week. He reports that he does not use drugs. ? ?Allergies: No Known Allergies ? ?Medications: ?Current Outpatient Medications  ?Medication Instructions  ? atorvastatin (LIPITOR) 20 mg, Oral, Daily  ? cyclobenzaprine (FLEXERIL) 10 mg, Oral, Daily at bedtime  ? hydrochlorothiazide (HYDRODIURIL) 25 MG tablet TAKE 1 TABLET BY MOUTH EVERY DAY  ?  ibuprofen (ADVIL) 200 mg, Oral, Every 6 hours PRN  ? metFORMIN (GLUCOPHAGE) 500 mg, Oral, Daily with breakfast  ? mometasone (NASONEX) 50 MCG/ACT nasal spray 2 sprays, Daily PRN  ? pantoprazole (PROTONIX) 40 mg, Oral, 2 times daily  ? ? ?ROS - all of the below systems have been reviewed with the patient and positives are indicated with bold text ?General: chills, fever or night sweats ?Eyes: blurry vision or double vision ?ENT: epistaxis or sore throat ?Allergy/Immunology: itchy/watery eyes or nasal congestion ?Hematologic/Lymphatic: bleeding problems, blood clots or swollen lymph nodes ?Endocrine: temperature intolerance or unexpected weight changes ?Breast: new or changing breast lumps or nipple discharge ?Resp: cough, shortness of breath, or wheezing ?CV: chest pain or dyspnea on exertion ?GI: as per HPI ?GU: dysuria, trouble voiding, or hematuria ?MSK: joint pain or joint stiffness ?Neuro: TIA or stroke symptoms ?Derm: pruritus and skin lesion changes ?Psych: anxiety and depression ? ?Objective  ? ?PE ?Height '5\' 7"'$  (1.702 m), weight 99.8 kg. ?Constitutional: NAD; conversant; no deformities ?Eyes: Moist conjunctiva; no lid lag; anicteric; PERRL ?Neck: Trachea midline; no thyromegaly ?Lungs: Normal respiratory effort; no tactile fremitus ?CV: RRR; no palpable thrills; no pitting edema ?GI: Abd Soft, nontender, bilateral inguinal hernias; no palpable hepatosplenomegaly ?MSK: Normal range of motion of  extremities; no clubbing/cyanosis ?Psychiatric: Appropriate affect; alert and oriented x3 ?Lymphatic: No palpable cervical or axillary lymphadenopathy ? ?No results found for this or any previous visit (from the past 24 hour(s)). ? ?Imaging Orders  ?No imaging studies ordered today  ? ?CTA C/A/P 05/25/21: ? ?1. No acute vascular abnormality. ?2. No acute intrathoracic abnormality. ?3. Query irregular hypodense urinary bladder wall thickening. ?Finding of unclear etiology. Recommend correlation with urinalysis ?and  possible direct visualization. Query inflammatory pseudotumor or ?cystitis. ?4. Prostatomegaly. ?5. Bilateral small volume inguinal hernia on the left containing fat ?not on the right containing fat as well as a short loop of small ?bowel. No associated findings to suggest ischemia. No associated ?bowel obstruction. ? ? ? ?Assessment and Plan  ? ?Assessment and Plan:  ? ?Diagnoses and all orders for this visit: ? ?Bilateral recurrent inguinal hernia without obstruction or gangrene ? ? ?I recommended laparoscopic recurrent bilateral inguinal hernia repair with mesh. The procedure itself as well as its risk, benefits, and alternatives were discussed with the patient in full. After full discussion all questions answered the patient granted consent to proceed.  ? ?Felicie Morn, MD ? ?[facility] ?Use [email or web address] to contact on call provider ? ? ? ?

## 2021-07-20 NOTE — Transfer of Care (Signed)
Immediate Anesthesia Transfer of Care Note ? ?Patient: Troy Arellano ? ?Procedure(s) Performed: LAPAROSCOPIC RECURRENT BILATERAL INGUINAL HERNIA REPAIR WITH MESH (Bilateral: Abdomen) ? ?Patient Location: PACU ? ?Anesthesia Type:General ? ?Level of Consciousness: drowsy, patient cooperative and responds to stimulation ? ?Airway & Oxygen Therapy: Patient Spontanous Breathing and Patient connected to face mask oxygen ? ?Post-op Assessment: Report given to RN and Post -op Vital signs reviewed and stable ? ?Post vital signs: Reviewed and stable ? ?Last Vitals:  ?Vitals Value Taken Time  ?BP 128/86 07/20/21 1219  ?Temp    ?Pulse 83 07/20/21 1222  ?Resp 18 07/20/21 1222  ?SpO2 98 % 07/20/21 1222  ?Vitals shown include unvalidated device data. ? ?Last Pain:  ?Vitals:  ? 07/20/21 0938  ?TempSrc: Oral  ?PainSc: 0-No pain  ?   ? ?Patients Stated Pain Goal: 5 (07/20/21 9163) ? ?Complications: No notable events documented. ?

## 2021-07-20 NOTE — Anesthesia Preprocedure Evaluation (Addendum)
Anesthesia Evaluation  ?Patient identified by MRN, date of birth, ID band ?Patient awake ? ? ? ?Reviewed: ?Allergy & Precautions, NPO status , Patient's Chart, lab work & pertinent test results ? ?Airway ?Mallampati: II ? ?TM Distance: >3 FB ?Neck ROM: Full ? ? ? Dental ?no notable dental hx. ? ?  ?Pulmonary ?neg pulmonary ROS,  ?  ?Pulmonary exam normal ?breath sounds clear to auscultation ? ? ? ? ? ? Cardiovascular ?hypertension, Pt. on medications ?Normal cardiovascular exam ?Rhythm:Regular Rate:Normal ? ? ?  ?Neuro/Psych ? Headaches, negative psych ROS  ? GI/Hepatic ?Neg liver ROS, GERD  Controlled,  ?Endo/Other  ?diabetes, Type 2, Oral Hypoglycemic AgentsBMI 35 ? Renal/GU ?negative Renal ROS  ?negative genitourinary ?  ?Musculoskeletal ? ?(+) Arthritis ,  ? Abdominal ?  ?Peds ? Hematology ?negative hematology ROS ?(+)   ?Anesthesia Other Findings ?B/L recurrent inguinal hernias  ? Reproductive/Obstetrics ?negative OB ROS ? ?  ? ? ? ? ? ? ? ? ? ? ? ? ? ?  ?  ? ? ? ? ? ? ? ?Anesthesia Physical ?Anesthesia Plan ? ?ASA: 2 ? ?Anesthesia Plan: General  ? ?Post-op Pain Management:   ? ?Induction: Intravenous ? ?PONV Risk Score and Plan: 3 and Ondansetron, Dexamethasone, Midazolam and Treatment may vary due to age or medical condition ? ?Airway Management Planned: Oral ETT ? ?Additional Equipment: None ? ?Intra-op Plan:  ? ?Post-operative Plan: Extubation in OR ? ?Informed Consent: I have reviewed the patients History and Physical, chart, labs and discussed the procedure including the risks, benefits and alternatives for the proposed anesthesia with the patient or authorized representative who has indicated his/her understanding and acceptance.  ? ? ? ?Dental advisory given ? ?Plan Discussed with: CRNA ? ?Anesthesia Plan Comments:   ? ? ? ? ? ?Anesthesia Quick Evaluation ? ?

## 2021-07-20 NOTE — Op Note (Signed)
? ?Patient: Troy Arellano ([birth year], [id number]) ? ?Date of Surgery: 07/20/2021  ? ?Preoperative Diagnosis: BILATERAL RECURRENT INGUINAL HERNIAS  ? ?Postoperative Diagnosis: BILATERAL RECURRENT INGUINAL HERNIAS  ? ?Surgical Procedure: LAPAROSCOPIC RECURRENT BILATERAL INGUINAL HERNIA REPAIR WITH MESH:   ? ?Operative Team Members:  ?Surgeon(s) and Role: ?   * Naesha Buckalew, Nickola Major, MD - Primary  ? ?Anesthesiologist: Pervis Hocking, DO ?CRNA: Rogers Blocker, CRNA; Suan Halter, CRNA  ? ?Anesthesia: General  ? ?Fluids:  ?Total I/O ?In: 100 [IV Piggyback:100] ?Out: -  ? ?Complications: None ? ?Drains:  None ? ?Specimen: None ? ?Disposition:  PACU - hemodynamically stable. ? ?Plan of Care: Discharge to home after PACU ? ?Indications for Procedure: Troy Arellano is a 56 y.o. male who presented with recurrent bilateral inguinal hernias. ? ?Findings:  ?Technique: Transabdominal preperitoneal (TAPP) ?Hernia Location: right direct inguinal hernia, left direct inguinal hernia ?Mesh Size &Type:  Bard 3D max extra-large right and Bard 3D max extra-large left meshes ?Mesh Fixation: Endo-Universal hernia stapler ? ?Infection status: ?Patient: Private Patient Elective Case ?Case: Elective ?Infection Present At Time Of Surgery (PATOS): None ? ? ?Description of Procedure: ? ?The patient was positioned supine, padded and secured to the bed, with both arms tucked.  The abdomen was widely prepped and draped.  A time out procedure was performed.  A 1 cm infraumbilical incision was made.  The abdomen was entered without trauma to the underlying viscera.  The abdomen was insufflated to 15 mm of Hg.  A 12 mm trocar was inserted at the periumbilical incision.  Additional 5 mm trocars were placed in the left and right abdomen.  There was no trauma to the underlying viscera. ? ?There was an direct hernia on the RIGHT.  Utilizing a transabdominal pre peritoneal technique (TAPP), a horizontal incision was made in the peritoneum,  immediately below the umbilicus.  Dissection was carried out in the pre peritoneal space down to the level of the hernia sac which was reduced into the peritoneal cavity completely.  The cord contents were parietalized and preserved.  A large pre peritoneal dissection was performed to uncover the direct, indirect, femoral and obturator spaces.  Cooper?s ligament was uncovered medially and the psoas muscle uncovered laterally. ? ?The mesh, as documented above, was opened and advanced into the pre peritoneal position so that it more than adequately covered the indirect, direct, femoral and obturator spaces.  The mesh laid flat, with no inferior folds and covered the entire myopectineal orifice.  The mesh was fixated with the endo-universal hernia stapler to Cooper?s ligament and the posterior aspect of the rectus muscle.  The peritoneal flap was closed with the same device.  There were no peritoneal defects or exposed mesh at the conclusion. ? ?There was an Direct hernia on the LEFT.  Utilizing a transabdominal pre peritoneal technique (TAPP), a horizontal incision was made in the peritoneum, immediately below the umbilicus.  Dissection was carried out in the pre peritoneal space down to the level of the hernia sac which was reduced into the peritoneal cavity completely.  The cord contents were parietalized and preserved.  A large pre peritoneal dissection was performed to uncover the direct, indirect, femoral and obturator spaces.  Cooper?s ligament was uncovered medially and the psoas muscle uncovered laterally. ? ?The mesh, as documented above, was opened and advanced into the pre peritoneal position so that it more than adequately covered the indirect, direct, femoral and obturator spaces.  The mesh laid flat, with no inferior folds  and covered the entire myopectineal orifice.  The mesh was fixated with the endo-universal hernia stapler to Cooper?s ligament and the posterior aspect of the rectus muscle.  The  peritoneal flap was closed with the same device.  There were no peritoneal defects or exposed mesh at the conclusion. ? ?The umbilical trocar was removed and the fascial defect was closed with a 0 Vicryl suture.  The peritoneal cavity was completely desufflated, the trocars removed and the skin closed with 4-0 Monocryl subcuticular suture and skin glue.  All sponge and needle counts were correct at the end of the case. ? ?Louanna Raw, MD ?General, Bariatric, & Minimally Invasive Surgery ?[facility], [place] ? ?

## 2021-07-20 NOTE — Anesthesia Postprocedure Evaluation (Signed)
Anesthesia Post Note ? ?Patient: Troy Arellano ? ?Procedure(s) Performed: LAPAROSCOPIC RECURRENT BILATERAL INGUINAL HERNIA REPAIR WITH MESH (Bilateral: Abdomen) ? ?  ? ?Patient location during evaluation: PACU ?Anesthesia Type: General ?Level of consciousness: awake and alert, oriented and patient cooperative ?Pain management: pain level controlled ?Vital Signs Assessment: post-procedure vital signs reviewed and stable ?Respiratory status: spontaneous breathing, nonlabored ventilation and respiratory function stable ?Cardiovascular status: blood pressure returned to baseline and stable ?Postop Assessment: no apparent nausea or vomiting ?Anesthetic complications: no ? ? ?No notable events documented. ? ?Last Vitals:  ?Vitals:  ? 07/20/21 0938  ?BP: (!) 142/88  ?Pulse: 64  ?Resp: 16  ?Temp: 37.1 ?C  ?SpO2: 100%  ?  ?Last Pain:  ?Vitals:  ? 07/20/21 0938  ?TempSrc: Oral  ?PainSc: 0-No pain  ? ? ?  ?  ?  ?  ?  ?  ? ?Jarome Matin Sabriah Hobbins ? ? ? ? ?

## 2021-07-20 NOTE — Anesthesia Procedure Notes (Signed)
Procedure Name: Intubation ?Date/Time: 07/20/2021 10:27 AM ?Performed by: Rogers Blocker, CRNA ?Pre-anesthesia Checklist: Patient identified, Emergency Drugs available, Suction available and Patient being monitored ?Patient Re-evaluated:Patient Re-evaluated prior to induction ?Oxygen Delivery Method: Circle System Utilized ?Preoxygenation: Pre-oxygenation with 100% oxygen ?Induction Type: IV induction ?Ventilation: Mask ventilation without difficulty ?Laryngoscope Size: Mac and 4 ?Grade View: Grade I ?Tube type: Oral ?Tube size: 7.5 mm ?Number of attempts: 1 ?Airway Equipment and Method: Stylet and Bite block ?Placement Confirmation: ETT inserted through vocal cords under direct vision, positive ETCO2 and breath sounds checked- equal and bilateral ?Secured at: 22 cm ?Tube secured with: Tape ?Dental Injury: Teeth and Oropharynx as per pre-operative assessment  ? ? ? ? ?

## 2021-07-23 ENCOUNTER — Encounter (HOSPITAL_BASED_OUTPATIENT_CLINIC_OR_DEPARTMENT_OTHER): Payer: Self-pay | Admitting: Surgery

## 2021-07-26 ENCOUNTER — Telehealth: Payer: Self-pay | Admitting: Physician Assistant

## 2021-07-26 NOTE — Telephone Encounter (Signed)
Yes, pt will have to contact surgeon for the medication. Especially if she has never prescribed that medication for him. ?

## 2021-07-26 NOTE — Telephone Encounter (Signed)
Pt called asking for oxycodone stated he had sx on 3/17 has not seen Samantha since 05/22/21.- Patient was recommended to schedule an apt with [facility]. Patient will call surgeon for refills.   ?

## 2021-09-21 ENCOUNTER — Other Ambulatory Visit: Payer: Self-pay | Admitting: Physician Assistant

## 2021-10-10 ENCOUNTER — Encounter: Payer: BC Managed Care – PPO | Admitting: Family

## 2021-10-10 ENCOUNTER — Encounter: Payer: Self-pay | Admitting: Physician Assistant

## 2021-10-10 ENCOUNTER — Ambulatory Visit (INDEPENDENT_AMBULATORY_CARE_PROVIDER_SITE_OTHER): Payer: BC Managed Care – PPO | Admitting: Physician Assistant

## 2021-10-10 VITALS — BP 126/80 | HR 76 | Temp 98.2°F | Ht 67.0 in | Wt 229.5 lb

## 2021-10-10 DIAGNOSIS — K219 Gastro-esophageal reflux disease without esophagitis: Secondary | ICD-10-CM | POA: Diagnosis not present

## 2021-10-10 DIAGNOSIS — T8189XA Other complications of procedures, not elsewhere classified, initial encounter: Secondary | ICD-10-CM

## 2021-10-10 DIAGNOSIS — M25572 Pain in left ankle and joints of left foot: Secondary | ICD-10-CM

## 2021-10-10 MED ORDER — DOXYCYCLINE HYCLATE 100 MG PO TABS: 100.0000 mg | ORAL_TABLET | Freq: Two times a day (BID) | ORAL | 0 refills | Status: DC

## 2021-10-10 MED ORDER — PANTOPRAZOLE SODIUM 40 MG PO TBEC
40.0000 mg | DELAYED_RELEASE_TABLET | Freq: Every day | ORAL | 0 refills | Status: DC
Start: 2021-10-10 — End: 2022-05-09

## 2021-10-10 NOTE — Progress Notes (Addendum)
Troy Arellano is a 56 y.o. male here for a follow up of a pre-existing problem.  SCRIBE STATEMENT  History of Present Illness:   Chief Complaint  Patient presents with   Ankle Pain    Pt c/o left ankle pain x 2 weeks. He has been taking Advil with some relief. He has an appt on Sat with the foot doctor.   Wound Check    Pt had hernia surgery on March 17th, left side of abdomen incision not healed and there is a knot at site.    HPI  Ankle Pain Patient complain of left ankle pain that has been onset for the past several years. Located on midfoot. He states he has been unable to put pressure on his left ankle. Symptoms seems to be getting worse. He saw Dr. Tyson Dense for this issue on 07/03/2017. He was wearing brace and using icy hot for his pain at that time. He had some issues for several years prior to that. Had x-ray which showed posterior tibial tendon tear. He was given steroid and was prescribed Medrol Dosepak for the pain.  He was requested to have MRI done. States he has not had this. He actually has follow-up with podiatry this Saturday.  He has tried taking Ibuprofen about 2 times daily with some improvement.   Problem involving surgical incision  Patient underwent laparoscopic bilateral inguinal hernia repair on 07/20/2021. Today, he reports left side of abdomen incision has not healed since the surgery. He has noticed blood around the incision area. He states he has squeezed the area and has noticed blood about 5 days ago. Has had some pain in the left abdomen and he is concerned about this. Denies any other concerning sx.   GERD Currently on protonix 40 mg BID. Requesting refill. States that this is helpful for her and needs refill.  Past Medical History:  Diagnosis Date   Arthritis    Back   Benign localized prostatic hyperplasia with lower urinary tract symptoms (LUTS)    urologist--- dr Cain Sieve   Bilateral recurrent inguinal hernia    GERD (gastroesophageal reflux disease)     History of adenomatous polyp of colon    History of COVID-19 05/2020   per pt mild symptoms that resovled   Hyperlipidemia    Hypertension    followed by pcp   Type 2 diabetes mellitus ([place])    followed by pcp   (07-17-2021  per pt does not take check blood sugar )     Social History   Tobacco Use   Smoking status: Never   Smokeless tobacco: Never  Vaping Use   Vaping Use: Never used  Substance Use Topics   Alcohol use: Yes    Alcohol/week: 2.0 - 3.0 standard drinks    Types: 2 - 3 Standard drinks or equivalent per week   Drug use: No    Past Surgical History:  Procedure Laterality Date   COLONOSCOPY  12/28/2020   by dr h. danis   INGUINAL HERNIA REPAIR Bilateral    right 1987 and left 1988   INGUINAL HERNIA REPAIR Bilateral 07/20/2021   Procedure: LAPAROSCOPIC RECURRENT BILATERAL INGUINAL HERNIA REPAIR WITH MESH;  Surgeon: Stechschulte, Nickola Major, MD;  Location: [facility];  Service: General;  Laterality: Bilateral;    Family History  Problem Relation Age of Onset   Hypertension Mother    Hypertension Father    Heart failure Father    Lymphoma Sister    Colon cancer  Neg Hx    Esophageal cancer Neg Hx    Rectal cancer Neg Hx    Stomach cancer Neg Hx    Colon polyps Neg Hx     No Known Allergies  Current Medications:   Current Outpatient Medications:    atorvastatin (LIPITOR) 20 MG tablet, Take 1 tablet (20 mg total) by mouth daily. (Patient taking differently: Take 1 tablet by mouth daily.), Disp: 90 tablet, Rfl: 3   cyclobenzaprine (FLEXERIL) 10 MG tablet, Take 1 tablet (10 mg total) by mouth at bedtime. (Patient taking differently: Take 10 mg by mouth at bedtime as needed.), Disp: 10 tablet, Rfl: 0   hydrochlorothiazide (HYDRODIURIL) 25 MG tablet, TAKE 1 TABLET BY MOUTH EVERY DAY, Disp: 90 tablet, Rfl: 0   ibuprofen (ADVIL) 200 MG tablet, Take 200 mg by mouth every 6 (six) hours as needed., Disp: , Rfl:    metFORMIN (GLUCOPHAGE) 500 MG tablet,  Take 1 tablet (500 mg total) by mouth daily with breakfast. (Patient taking differently: Take 500 mg by mouth daily with breakfast.), Disp: 90 tablet, Rfl: 3   mometasone (NASONEX) 50 MCG/ACT nasal spray, 2 sprays daily as needed., Disp: , Rfl:    pantoprazole (PROTONIX) 40 MG tablet, Take 1 tablet (40 mg total) by mouth 2 (two) times daily. (Patient taking differently: Take 40 mg by mouth 2 (two) times daily as needed.), Disp: 28 tablet, Rfl: 0   Review of Systems:   ROS Negative unless otherwise specified per HPI.   Vitals:   Vitals:   10/10/21 0818  BP: 126/80  Pulse: 76  Temp: 98.2 F (36.8 C)  TempSrc: Temporal  SpO2: 95%  Weight: 229 lb 8 oz (104.1 kg)  Height: '5\' 7"'$  (1.702 m)     Body mass index is 35.94 kg/m.  Physical Exam:   Physical Exam Vitals and nursing note reviewed.  Constitutional:      Appearance: He is well-developed.  HENT:     Head: Normocephalic.  Eyes:     Conjunctiva/sclera: Conjunctivae normal.     Pupils: Pupils are equal, round, and reactive to light.  Pulmonary:     Effort: Pulmonary effort is normal.  Musculoskeletal:        General: Normal range of motion.     Cervical back: Normal range of motion.  Skin:    General: Skin is warm and dry.     Comments: Approximately 1cm area of slight erythema and superficial skin loss to left abdominal wall without out TTP or active drainage; slight induration, no fluctuance  Neurological:     Mental Status: He is alert and oriented to person, place, and time.  Psychiatric:        Behavior: Behavior normal.        Thought Content: Thought content normal.        Judgment: Judgment normal.    Assessment and Plan:   Acute left ankle pain Did not evaluate -- he would like to defer intervention to already scheduled podiatry appointment this weekend  Problem involving surgical incision Delayed healing  No red flags showing significant systemic infection Will trial doxycycline for further healing, if  any lack of improvement or concerns, discussed that he will need to follow-up with surgical group  Gastroesophageal reflux disease, unspecified whether esophagitis present Controlled Refill protonix but decrease to 40 mg daily Follow-up as needed  I,Savera Zaman,acting as a scribe for [employer], PA.,have documented all relevant documentation on the behalf of [name], PA,as directed by  [employer]  [place], [place] while in the presence of [name], [place].   I, [name], [place], have reviewed all documentation for this visit. The documentation on 10/10/21 for the exam, diagnosis, procedures, and orders are all accurate and complete.  [name], PA-C

## 2021-10-10 NOTE — Patient Instructions (Signed)
It was great to see you!  Start doxycycline antibiotic for your surgical incision If still a problem when you are done with this, please call you surgeon  Good luck with the foot doctor  Take care,  [name] PA-C

## 2021-10-13 ENCOUNTER — Other Ambulatory Visit: Payer: Self-pay

## 2021-10-13 ENCOUNTER — Emergency Department (HOSPITAL_BASED_OUTPATIENT_CLINIC_OR_DEPARTMENT_OTHER): Payer: BC Managed Care – PPO

## 2021-10-13 ENCOUNTER — Emergency Department (HOSPITAL_BASED_OUTPATIENT_CLINIC_OR_DEPARTMENT_OTHER)
Admission: EM | Admit: 2021-10-13 | Discharge: 2021-10-13 | Disposition: A | Discharge status: Home / Self Care | Payer: BC Managed Care – PPO | Attending: Emergency Medicine | Admitting: Emergency Medicine

## 2021-10-13 ENCOUNTER — Ambulatory Visit: Payer: BC Managed Care – PPO | Admitting: Podiatry

## 2021-10-13 DIAGNOSIS — W19XXXA Unspecified fall, initial encounter: Secondary | ICD-10-CM | POA: Diagnosis not present

## 2021-10-13 DIAGNOSIS — S99912A Unspecified injury of left ankle, initial encounter: Secondary | ICD-10-CM | POA: Diagnosis present

## 2021-10-13 DIAGNOSIS — Y990 Civilian activity done for income or pay: Secondary | ICD-10-CM | POA: Diagnosis not present

## 2021-10-13 DIAGNOSIS — S93402A Sprain of unspecified ligament of left ankle, initial encounter: Secondary | ICD-10-CM | POA: Insufficient documentation

## 2021-10-13 NOTE — ED Notes (Signed)
Pt declined ice pack

## 2021-10-13 NOTE — ED Notes (Signed)
Pt did not want crutches.

## 2021-10-13 NOTE — ED Triage Notes (Signed)
Pt c/o pain to left ankle x 2 days. Pt states he believes he injured it at work.

## 2021-10-13 NOTE — Discharge Instructions (Signed)
Recommend rest, ice, elevation of the extremity, and NSAIDS for pain control. Follow-up with your PCP or sports medicine for a repeat assessment if symptoms do not improve. Your XR was without fracture.

## 2021-10-13 NOTE — ED Provider Notes (Signed)
[facility] EMERGENCY DEPT Provider Note   CSN: [id number] Arrival date & time: 10/13/21  1008     History  Chief Complaint  Patient presents with   Ankle Pain    Troy Arellano is a 56 y.o. male.   Ankle Pain   56 year old male presenting to the emergency department with left ankle pain for the past 2 days.  He believes he injured it at work when he landed on it funny and rolled it.  He denies any other injuries or complaints.  He has been having some pain with weightbearing and ambulation.  Home Medications Prior to Admission medications   Medication Sig Start Date End Date Taking? Authorizing Provider  atorvastatin (LIPITOR) 20 MG tablet Take 1 tablet (20 mg total) by mouth daily. Patient taking differently: Take 1 tablet by mouth daily. 08/23/20   [name], PA  cyclobenzaprine (FLEXERIL) 10 MG tablet Take 1 tablet (10 mg total) by mouth at bedtime. Patient taking differently: Take 10 mg by mouth at bedtime as needed. 05/17/21   White, Leitha Schuller, NP  doxycycline (VIBRA-TABS) 100 MG tablet Take 1 tablet (100 mg total) by mouth 2 (two) times daily. 10/10/21   [name], PA  hydrochlorothiazide (HYDRODIURIL) 25 MG tablet TAKE 1 TABLET BY MOUTH EVERY DAY 09/21/21   [name], PA  ibuprofen (ADVIL) 200 MG tablet Take 200 mg by mouth every 6 (six) hours as needed.    [provider]  metFORMIN (GLUCOPHAGE) 500 MG tablet Take 1 tablet (500 mg total) by mouth daily with breakfast. Patient taking differently: Take 500 mg by mouth daily with breakfast. 08/23/20   [name], PA  mometasone (NASONEX) 50 MCG/ACT nasal spray 2 sprays daily as needed. 09/04/20   [provider]  pantoprazole (PROTONIX) 40 MG tablet Take 1 tablet (40 mg total) by mouth daily. 10/10/21   [name], PA      Allergies    Patient has no known allergies.    Review of Systems   Review of Systems  All other systems reviewed and are  negative.   Physical Exam Updated Vital Signs BP (!) 142/85   Pulse 62   Temp 98 F (36.7 C)   Resp 17   Ht '5\' 7"'$  (1.702 m)   Wt 99.8 kg   SpO2 97%   BMI 34.46 kg/m  Physical Exam Vitals and nursing note reviewed.  Constitutional:      General: He is not in acute distress. HENT:     Head: Normocephalic and atraumatic.  Eyes:     Conjunctiva/sclera: Conjunctivae normal.     Pupils: Pupils are equal, round, and reactive to light.  Cardiovascular:     Rate and Rhythm: Normal rate and regular rhythm.  Pulmonary:     Effort: Pulmonary effort is normal. No respiratory distress.  Abdominal:     General: There is no distension.     Tenderness: There is no guarding.  Musculoskeletal:        General: Swelling and tenderness present. No deformity or signs of injury.     Cervical back: Neck supple.     Comments: Left ankle with some soft tissue swelling, intact range of motion, 2+ DP pulses, minimal tenderness to palpation  Skin:    Findings: No lesion or rash.  Neurological:     General: No focal deficit present.     Mental Status: He is alert. Mental status is at baseline.     ED Results / Procedures / Treatments  Labs (all labs ordered are listed, but only abnormal results are displayed) Labs Reviewed - No data to display  EKG None  Radiology DG Ankle Complete Left  Result Date: 10/13/2021 CLINICAL DATA:  Left ankle pain for 2 days. Questionable injury at work. EXAM: LEFT ANKLE COMPLETE - 3+ VIEW COMPARISON:  07/03/2017. FINDINGS: No fracture.  No bone lesion. Ankle joint is normally spaced and aligned. No significant arthropathic changes. Flattened plantar arch.  Tiny dorsal and plantar calcaneal spurs. Mild soft tissue swelling, nonspecific. IMPRESSION: 1. No fracture.  No acute skeletal abnormality. Electronically Signed   By: Lajean Manes M.D.   On: 10/13/2021 10:44    Procedures Procedures    Medications Ordered in ED Medications - No data to display  ED  Course/ Medical Decision Making/ A&P                           Medical Decision Making Amount and/or Complexity of Data Reviewed Radiology: ordered.    56 year old male presenting to the emergency department with left ankle pain for the past 2 days.  He believes he injured it at work when he landed on it funny and rolled it.  He denies any other injuries or complaints.  He has been having some pain with weightbearing and ambulation.  On arrival, the patient was vitally stable.  Physical exam with left ankle tenderness to palpation, minimal swelling, neurovascular intact with 2+ DP pulses, intact range of motion.  X-ray imaging was performed of the ankle and was negative for acute fracture or malalignment.  Symptoms are consistent with likely ankle sprain.  The patient was advised rest, ice, elevation of the affected extremity, NSAIDs for pain control and follow-up outpatient with his PCP and/or sports medicine if symptoms do not improve.  Offered crutches and Ace wrap for comfort  Final Clinical Impression(s) / ED Diagnoses Final diagnoses:  Sprain of left ankle, unspecified ligament, initial encounter    Rx / DC Orders ED Discharge Orders          Ordered    AMB referral to sports medicine        10/13/21 1131              Regan Lemming, MD 10/13/21 1133

## 2021-10-13 NOTE — ED Notes (Signed)
Dc instructions reviewed with patient. Patient voiced understanding. Dc with belongings.  °

## 2021-10-14 NOTE — Progress Notes (Signed)
This encounter was created in error - please disregard.

## 2021-10-16 ENCOUNTER — Ambulatory Visit (INDEPENDENT_AMBULATORY_CARE_PROVIDER_SITE_OTHER): Payer: BC Managed Care – PPO

## 2021-10-16 ENCOUNTER — Ambulatory Visit: Payer: BC Managed Care – PPO | Admitting: Podiatry

## 2021-10-16 DIAGNOSIS — M25872 Other specified joint disorders, left ankle and foot: Secondary | ICD-10-CM | POA: Diagnosis not present

## 2021-10-16 DIAGNOSIS — Q666 Other congenital valgus deformities of feet: Secondary | ICD-10-CM

## 2021-10-16 NOTE — Progress Notes (Signed)
Subjective:  Patient ID: Troy Arellano, male    DOB: January 31, 1966,  MRN: [id number]  No chief complaint on file.   56 y.o. male presents with the above complaint.  Patient presents with severe pes planovalgus deformity with pain in the arch and heel as well as lateral side of the foot from impingement of the skin.  Patient states he has been ill in the last 3 years.  He had done orthotics cam boot immobilization in the past none of which has helped.  He states he is ready for surgical intervention at this time.  He has failed a lot of conservative treatment options.  He has done shoe gear modification as well.  Pain scale 7 out of 10 hurts with ambulation and hurts with working.  He denies any other acute issues.  He is a diabetic but well controlled at 6.2   Review of Systems: Negative except as noted in the HPI. Denies N/V/F/Ch.  Past Medical History:  Diagnosis Date   Arthritis    Back   Benign localized prostatic hyperplasia with lower urinary tract symptoms (LUTS)    urologist--- dr Cain Sieve   Bilateral recurrent inguinal hernia    GERD (gastroesophageal reflux disease)    History of adenomatous polyp of colon    History of COVID-19 05/2020   per pt mild symptoms that resovled   Hyperlipidemia    Hypertension    followed by pcp   Type 2 diabetes mellitus ([place])    followed by pcp   (07-17-2021  per pt does not take check blood sugar )    Current Outpatient Medications:    atorvastatin (LIPITOR) 20 MG tablet, Take 1 tablet (20 mg total) by mouth daily. (Patient taking differently: Take 1 tablet by mouth daily.), Disp: 90 tablet, Rfl: 3   cyclobenzaprine (FLEXERIL) 10 MG tablet, Take 1 tablet (10 mg total) by mouth at bedtime. (Patient taking differently: Take 10 mg by mouth at bedtime as needed.), Disp: 10 tablet, Rfl: 0   doxycycline (VIBRA-TABS) 100 MG tablet, Take 1 tablet (100 mg total) by mouth 2 (two) times daily., Disp: 14 tablet, Rfl: 0   hydrochlorothiazide (HYDRODIURIL) 25 MG  tablet, TAKE 1 TABLET BY MOUTH EVERY DAY, Disp: 90 tablet, Rfl: 0   ibuprofen (ADVIL) 200 MG tablet, Take 200 mg by mouth every 6 (six) hours as needed., Disp: , Rfl:    metFORMIN (GLUCOPHAGE) 500 MG tablet, Take 1 tablet (500 mg total) by mouth daily with breakfast. (Patient taking differently: Take 500 mg by mouth daily with breakfast.), Disp: 90 tablet, Rfl: 3   mometasone (NASONEX) 50 MCG/ACT nasal spray, 2 sprays daily as needed., Disp: , Rfl:    pantoprazole (PROTONIX) 40 MG tablet, Take 1 tablet (40 mg total) by mouth daily., Disp: 90 tablet, Rfl: 0  Social History   Tobacco Use  Smoking Status Never  Smokeless Tobacco Never    No Known Allergies Objective:  There were no vitals filed for this visit. There is no height or weight on file to calculate BMI. Constitutional Well developed. Well nourished.  Vascular Dorsalis pedis pulses palpable bilaterally. Posterior tibial pulses palpable bilaterally. Capillary refill normal to all digits.  No cyanosis or clubbing noted. Pedal hair growth normal.  Neurologic Normal speech. Oriented to person, place, and time. Epicritic sensation to light touch grossly present bilaterally.  Dermatologic Nails well groomed and normal in appearance. No open wounds. No skin lesions.  Orthopedic: Left severe pes planovalgus deformity noted with calcaneovalgus to many  toe signs unable to recreate the arch with dorsiflexion of the hallux.  Pain along the posterior tibial tendon as well as lateral ankle from impingement.  Mild pain with range of motion of the subtalar joint.  No pain at the ankle joint.  Positive Silfverskiold test noted with gastrocnemius equinus   Radiographs: 3 views of skeletally mature adult left foot: There is decreasing calcaneal inclination angle increasing talar declination angle anterior break in the cyma line.  Meary's angle is within normal limits.  Severe pes planovalgus deformity noted. Assessment:   1. Impingement  syndrome of left ankle   2. Pes planovalgus    Plan:  Patient was evaluated and treated and all questions answered.  Left severe pes planovalgus/flatfoot deformity -I explained to the patient the etiology of pes planovalgus and worse treatment options were discussed.  Patient has done multiple immobilization of the cam boot as well as multiple orthotics insoles and shoe gear modification none of which has helped.  At this time he would like to discuss surgical options for flatfoot reconstruction.  At this time I will obtain a CT scan for surgical planning.  I briefly discussed with her shoe he will benefit from multiple calcaneal osteotomy as well as cotton osteotomy assuming there is no arthritic changes.  If there are arthritic changes he will need surgical fusions. -CT scan was ordered  No follow-ups on file.

## 2021-11-09 ENCOUNTER — Other Ambulatory Visit: Payer: BC Managed Care – PPO

## 2021-11-12 ENCOUNTER — Ambulatory Visit
Admission: RE | Admit: 2021-11-12 | Discharge: 2021-11-12 | Disposition: A | Discharge status: Home / Self Care | Payer: BC Managed Care – PPO | Source: Ambulatory Visit | Attending: Podiatry | Admitting: Podiatry

## 2021-11-12 DIAGNOSIS — Q666 Other congenital valgus deformities of feet: Secondary | ICD-10-CM

## 2021-11-12 DIAGNOSIS — M25872 Other specified joint disorders, left ankle and foot: Secondary | ICD-10-CM

## 2021-11-13 ENCOUNTER — Ambulatory Visit: Payer: BC Managed Care – PPO | Admitting: Podiatry

## 2021-11-13 ENCOUNTER — Encounter: Payer: Self-pay | Admitting: Podiatry

## 2021-11-13 DIAGNOSIS — M25872 Other specified joint disorders, left ankle and foot: Secondary | ICD-10-CM | POA: Diagnosis not present

## 2021-11-13 DIAGNOSIS — Z01818 Encounter for other preprocedural examination: Secondary | ICD-10-CM | POA: Diagnosis not present

## 2021-11-13 DIAGNOSIS — Q666 Other congenital valgus deformities of feet: Secondary | ICD-10-CM | POA: Diagnosis not present

## 2021-11-15 ENCOUNTER — Ambulatory Visit: Payer: BC Managed Care – PPO | Admitting: Podiatry

## 2021-11-15 ENCOUNTER — Telehealth: Payer: Self-pay | Admitting: Podiatry

## 2021-11-15 MED ORDER — OXYCODONE-ACETAMINOPHEN 5-325 MG PO TABS: 1.0000 | ORAL_TABLET | ORAL | 0 refills | Status: DC | PRN

## 2021-11-15 NOTE — Telephone Encounter (Signed)
Notified pt medication was sent in and that as far as surgery there was nothing sooner to move it to.Marland KitchenMarland Kitchen

## 2021-11-15 NOTE — Telephone Encounter (Signed)
Pt called stating he was to have pain medication called in at his last appt this week and the pharmacy has not received the rx..  He also was asking if you could move the surgery to  sooner than the 21st.

## 2021-11-16 NOTE — Progress Notes (Signed)
Subjective:  Patient ID: Troy Arellano, male    DOB: 06-09-1965,  MRN: [id number]  Chief Complaint  Patient presents with   Foot Pain    "It's terrible.  I'm waiting to talk to him about surgery."    56 y.o. male presents with the above complaint.  Patient presents with follow-up of severe pes planovalgus deformity with pain in the arch and the heel on the left lateral ankle due to impingement and compensation of flatfoot deformity.  Patient states that it hurts with ambulation has injection has not helped.  The cam boot immobilization has not helped.  He would like to discuss surgical options at this time pain scale is 8 out of 10 hurts with pressure.  He had a CT scan done and is here to go over the results.  Review of Systems: Negative except as noted in the HPI. Denies N/V/F/Ch.  Past Medical History:  Diagnosis Date   Arthritis    Back   Benign localized prostatic hyperplasia with lower urinary tract symptoms (LUTS)    urologist--- dr Cain Sieve   Bilateral recurrent inguinal hernia    GERD (gastroesophageal reflux disease)    History of adenomatous polyp of colon    History of COVID-19 05/2020   per pt mild symptoms that resovled   Hyperlipidemia    Hypertension    followed by pcp   Type 2 diabetes mellitus ([place])    followed by pcp   (07-17-2021  per pt does not take check blood sugar )    Current Outpatient Medications:    atorvastatin (LIPITOR) 20 MG tablet, Take 1 tablet (20 mg total) by mouth daily. (Patient taking differently: Take 1 tablet by mouth daily.), Disp: 90 tablet, Rfl: 3   cyclobenzaprine (FLEXERIL) 10 MG tablet, Take 1 tablet (10 mg total) by mouth at bedtime. (Patient taking differently: Take 10 mg by mouth at bedtime as needed.), Disp: 10 tablet, Rfl: 0   doxycycline (VIBRA-TABS) 100 MG tablet, Take 1 tablet (100 mg total) by mouth 2 (two) times daily., Disp: 14 tablet, Rfl: 0   hydrochlorothiazide (HYDRODIURIL) 25 MG tablet, TAKE 1 TABLET BY MOUTH EVERY DAY,  Disp: 90 tablet, Rfl: 0   ibuprofen (ADVIL) 200 MG tablet, Take 200 mg by mouth every 6 (six) hours as needed., Disp: , Rfl:    metFORMIN (GLUCOPHAGE) 500 MG tablet, Take 1 tablet (500 mg total) by mouth daily with breakfast. (Patient taking differently: Take 500 mg by mouth daily with breakfast.), Disp: 90 tablet, Rfl: 3   mometasone (NASONEX) 50 MCG/ACT nasal spray, 2 sprays daily as needed., Disp: , Rfl:    oxyCODONE-acetaminophen (PERCOCET) 5-325 MG tablet, Take 1 tablet by mouth every 4 (four) hours as needed for severe pain., Disp: 30 tablet, Rfl: 0   pantoprazole (PROTONIX) 40 MG tablet, Take 1 tablet (40 mg total) by mouth daily., Disp: 90 tablet, Rfl: 0  Social History   Tobacco Use  Smoking Status Never  Smokeless Tobacco Never    No Known Allergies Objective:  There were no vitals filed for this visit. There is no height or weight on file to calculate BMI. Constitutional Well developed. Well nourished.  Vascular Dorsalis pedis pulses palpable bilaterally. Posterior tibial pulses palpable bilaterally. Capillary refill normal to all digits.  No cyanosis or clubbing noted. Pedal hair growth normal.  Neurologic Normal speech. Oriented to person, place, and time. Epicritic sensation to light touch grossly present bilaterally.  Dermatologic Nails well groomed and normal in appearance. No open wounds.  No skin lesions.  Orthopedic: Left severe pes planovalgus deformity noted with calcaneovalgus to many toe signs unable to recreate the arch with dorsiflexion of the hallux.  Pain along the posterior tibial tendon as well as lateral ankle from impingement.  Mild pain with range of motion of the subtalar joint.  No pain at the ankle joint.  Positive Silfverskiold test noted with gastrocnemius equinus   Radiographs: 3 views of skeletally mature adult left foot: There is decreasing calcaneal inclination angle increasing talar declination angle anterior break in the cyma line.  Meary's  angle is within normal limits.  Severe pes planovalgus deformity noted.  IMPRESSION: 1. Mild tibiotalar, subtalar and midfoot degenerative changes. 2. Moderate degenerative changes at the talonavicular joint and calcaneocuboid joint. 3. Significant hindfoot valgus and pes planus. 4. No acute bony findings or destructive bony lesions. Assessment:   1. Pes planovalgus   2. Impingement syndrome of left ankle   3. Encounter for preoperative examination for general surgical procedure     Plan:  Patient was evaluated and treated and all questions answered.  Left severe pes planovalgus/flatfoot deformity -I explained to the patient the etiology of pes planovalgus and worse treatment options were discussed. -Patient has failed multiple cam boot immobilization and injection orthotics management at this time patient will benefit from surgical correction of flatfoot deformity. 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  As of right now there is no arthritis in the subtalar joint however if he were to fuse talonavicular joint and calcaneocuboid joint is only a matter time before arthritis due to limitation of the motion would develop in the subtalar joint.  Therefore I made the decision that he would benefit from subtalar joint fusion along with the talonavicular joint as well as calcaneocuboid joint fusion.  I will also incorporate a graft in the subtalar joint to help with reducing his pronation.  I discussed with the patient in extensive detail discussed my preoperative intraoperative postoperative plan.  Patient states understand would like to proceed despite all the risks.  I discussed with him his 3 to 6 months of recovery time he states understanding -Informed surgical risk consent was reviewed  and read aloud to the patient.  I reviewed the films.  I have discussed my findings with the patient in great detail.  I have discussed all risks including but not limited to infection, stiffness, scarring, limp, disability, deformity, damage to blood vessels and nerves, numbness, poor healing, need for braces, arthritis, chronic pain, amputation, death.  All benefits and realistic expectations discussed in great detail.  I have made no promises as to the outcome.  I have provided realistic expectations.  I have offered the patient a 2nd opinion, which they have declined and assured me they preferred to proceed despite the risks   No follow-ups on file.

## 2021-11-19 ENCOUNTER — Telehealth: Payer: Self-pay | Admitting: *Deleted

## 2021-11-19 DIAGNOSIS — M79676 Pain in unspecified toe(s): Secondary | ICD-10-CM

## 2021-11-19 DIAGNOSIS — Q666 Other congenital valgus deformities of feet: Secondary | ICD-10-CM

## 2021-11-19 NOTE — Telephone Encounter (Signed)
"  I'm about to have foot surgery next month.  I'm calling to see if I can get a knee scooter, so I can stay off this foot when I'm walking.  When I come home, I have to take a Oxycodone and a couple of Advil just to calm that foot down.  I don't want crutches."

## 2021-11-20 NOTE — Addendum Note (Signed)
Addended by: Boneta Lucks on: 11/20/2021 08:05 AM   Modules accepted: Orders

## 2021-11-23 ENCOUNTER — Telehealth: Payer: Self-pay | Admitting: Podiatry

## 2021-11-23 NOTE — Telephone Encounter (Signed)
"  I called you last week about a scooter or something that I can use to keep my foot off the ground."

## 2021-11-26 ENCOUNTER — Other Ambulatory Visit: Payer: Self-pay | Admitting: Podiatry

## 2021-11-26 ENCOUNTER — Telehealth: Payer: Self-pay | Admitting: *Deleted

## 2021-11-26 DIAGNOSIS — Q666 Other congenital valgus deformities of feet: Secondary | ICD-10-CM

## 2021-11-26 NOTE — Telephone Encounter (Signed)
"  Is there any way I can get some crutches?"

## 2021-11-27 ENCOUNTER — Encounter: Payer: Self-pay | Admitting: Podiatry

## 2021-11-27 DIAGNOSIS — M79676 Pain in unspecified toe(s): Secondary | ICD-10-CM

## 2021-11-29 ENCOUNTER — Telehealth: Payer: Self-pay | Admitting: Urology

## 2021-11-29 NOTE — Telephone Encounter (Signed)
DOS - 12/24/21  TRIPLE ARTHRODESIS LEFT --- 27517 GASTROCNEMIUS RECESS LEFT --- 00174 OSTEOTOMY LEFT --- 28300  [employer] EFFECTIVE DATE - 11/03/20  PLAN DEDUCTIBLE - $2,000.00 W/ $0.00 REMAINING OUT OF POCKET - $5,500.00 W/ $1,493.00 REMAINING COINSURANCE - 0% COPAY - $0.00  SPOKE WITH JULIE WITH [employer] AND SHE STATED THAT FOR CPT CODES 94496, [phone] AND 38466 NO PRIOR AUTH IS REQUIRED.  REF # [id number]

## 2021-12-13 ENCOUNTER — Other Ambulatory Visit: Payer: Self-pay | Admitting: Physician Assistant

## 2021-12-13 ENCOUNTER — Other Ambulatory Visit: Payer: Self-pay

## 2021-12-17 NOTE — Telephone Encounter (Signed)
Patient is calling for a medication  refill of oxycodone-ace, 5-325 mg, please advise.

## 2021-12-18 ENCOUNTER — Telehealth: Payer: Self-pay | Admitting: *Deleted

## 2021-12-18 MED ORDER — OXYCODONE-ACETAMINOPHEN 5-325 MG PO TABS: 1.0000 | ORAL_TABLET | ORAL | 0 refills | Status: DC | PRN

## 2021-12-18 NOTE — Telephone Encounter (Signed)
Patient is calling for a refill of the oxycodone- ace, 5-325 mg, please advise.

## 2021-12-24 ENCOUNTER — Other Ambulatory Visit: Payer: Self-pay | Admitting: Podiatry

## 2021-12-24 DIAGNOSIS — Q666 Other congenital valgus deformities of feet: Secondary | ICD-10-CM | POA: Diagnosis not present

## 2021-12-24 DIAGNOSIS — M25872 Other specified joint disorders, left ankle and foot: Secondary | ICD-10-CM | POA: Diagnosis not present

## 2021-12-24 DIAGNOSIS — M216X2 Other acquired deformities of left foot: Secondary | ICD-10-CM | POA: Diagnosis not present

## 2021-12-24 MED ORDER — OXYCODONE-ACETAMINOPHEN 5-325 MG PO TABS: 1.0000 | ORAL_TABLET | ORAL | 0 refills | Status: DC | PRN

## 2021-12-24 MED ORDER — IBUPROFEN 800 MG PO TABS: 800.0000 mg | ORAL_TABLET | Freq: Four times a day (QID) | ORAL | 1 refills | Status: DC | PRN

## 2021-12-25 ENCOUNTER — Inpatient Hospital Stay (HOSPITAL_COMMUNITY)
Admission: EM | Admit: 2021-12-25 | Discharge: 2021-12-27 | DRG: 948 | Disposition: A | Discharge status: Home / Self Care | Payer: BC Managed Care – PPO | Attending: Internal Medicine | Admitting: Internal Medicine

## 2021-12-25 ENCOUNTER — Other Ambulatory Visit: Payer: Self-pay

## 2021-12-25 ENCOUNTER — Encounter (HOSPITAL_COMMUNITY): Payer: Self-pay

## 2021-12-25 ENCOUNTER — Inpatient Hospital Stay (HOSPITAL_COMMUNITY): Payer: BC Managed Care – PPO

## 2021-12-25 ENCOUNTER — Telehealth: Payer: Self-pay | Admitting: Podiatry

## 2021-12-25 DIAGNOSIS — I10 Essential (primary) hypertension: Secondary | ICD-10-CM | POA: Diagnosis present

## 2021-12-25 DIAGNOSIS — E119 Type 2 diabetes mellitus without complications: Secondary | ICD-10-CM | POA: Diagnosis present

## 2021-12-25 DIAGNOSIS — Z79899 Other long term (current) drug therapy: Secondary | ICD-10-CM | POA: Diagnosis not present

## 2021-12-25 DIAGNOSIS — E8881 Metabolic syndrome: Secondary | ICD-10-CM

## 2021-12-25 DIAGNOSIS — N400 Enlarged prostate without lower urinary tract symptoms: Secondary | ICD-10-CM | POA: Diagnosis present

## 2021-12-25 DIAGNOSIS — K5900 Constipation, unspecified: Secondary | ICD-10-CM | POA: Diagnosis present

## 2021-12-25 DIAGNOSIS — G8918 Other acute postprocedural pain: Principal | ICD-10-CM | POA: Diagnosis present

## 2021-12-25 DIAGNOSIS — N179 Acute kidney failure, unspecified: Secondary | ICD-10-CM | POA: Diagnosis present

## 2021-12-25 DIAGNOSIS — E785 Hyperlipidemia, unspecified: Secondary | ICD-10-CM | POA: Diagnosis present

## 2021-12-25 DIAGNOSIS — Z8249 Family history of ischemic heart disease and other diseases of the circulatory system: Secondary | ICD-10-CM | POA: Diagnosis not present

## 2021-12-25 DIAGNOSIS — Z7984 Long term (current) use of oral hypoglycemic drugs: Secondary | ICD-10-CM

## 2021-12-25 DIAGNOSIS — Z8616 Personal history of COVID-19: Secondary | ICD-10-CM | POA: Diagnosis not present

## 2021-12-25 DIAGNOSIS — K219 Gastro-esophageal reflux disease without esophagitis: Secondary | ICD-10-CM | POA: Diagnosis present

## 2021-12-25 DIAGNOSIS — R52 Pain, unspecified: Secondary | ICD-10-CM | POA: Diagnosis present

## 2021-12-25 DIAGNOSIS — M79672 Pain in left foot: Secondary | ICD-10-CM | POA: Diagnosis present

## 2021-12-25 LAB — CBC WITH DIFFERENTIAL/PLATELET
Abs Immature Granulocytes: 0.03 10*3/uL (ref 0.00–0.07)
Basophils Absolute: 0 10*3/uL (ref 0.0–0.1)
Basophils Relative: 0 %
Eosinophils Absolute: 0 10*3/uL (ref 0.0–0.5)
Eosinophils Relative: 0 %
HCT: 38.3 % — ABNORMAL LOW (ref 39.0–52.0)
Hemoglobin: 12.7 g/dL — ABNORMAL LOW (ref 13.0–17.0)
Immature Granulocytes: 0 %
Lymphocytes Relative: 9 %
Lymphs Abs: 0.7 10*3/uL (ref 0.7–4.0)
MCH: 28.4 pg (ref 26.0–34.0)
MCHC: 33.2 g/dL (ref 30.0–36.0)
MCV: 85.7 fL (ref 80.0–100.0)
Monocytes Absolute: 0.7 10*3/uL (ref 0.1–1.0)
Monocytes Relative: 9 %
Neutro Abs: 6.5 10*3/uL (ref 1.7–7.7)
Neutrophils Relative %: 82 %
Platelets: 190 10*3/uL (ref 150–400)
RBC: 4.47 MIL/uL (ref 4.22–5.81)
RDW: 13.7 % (ref 11.5–15.5)
WBC: 8 10*3/uL (ref 4.0–10.5)
nRBC: 0 % (ref 0.0–0.2)

## 2021-12-25 LAB — GLUCOSE, CAPILLARY
Glucose-Capillary: 112 mg/dL — ABNORMAL HIGH (ref 70–99)
Glucose-Capillary: 104 mg/dL — ABNORMAL HIGH (ref 70–99)

## 2021-12-25 LAB — BASIC METABOLIC PANEL
Anion gap: 8 (ref 5–15)
BUN: 16 mg/dL (ref 6–20)
CO2: 23 mmol/L (ref 22–32)
Calcium: 8.9 mg/dL (ref 8.9–10.3)
Chloride: 103 mmol/L (ref 98–111)
Creatinine, Ser: 1.36 mg/dL — ABNORMAL HIGH (ref 0.61–1.24)
GFR, Estimated: >60 mL/min (ref >60)
Glucose, Bld: 129 mg/dL — ABNORMAL HIGH (ref 70–99)
Potassium: 3.5 mmol/L (ref 3.5–5.1)
Sodium: 134 mmol/L — ABNORMAL LOW (ref 135–145)

## 2021-12-25 LAB — HIV ANTIBODY (ROUTINE TESTING W REFLEX): HIV Screen 4th Generation wRfx: NONREACTIVE

## 2021-12-25 LAB — HEMOGLOBIN A1C
Hgb A1c MFr Bld: 6.1 % — ABNORMAL HIGH (ref 4.8–5.6)
Mean Plasma Glucose: 128.37 mg/dL

## 2021-12-25 LAB — CBG MONITORING, ED
Glucose-Capillary: 116 mg/dL — ABNORMAL HIGH (ref 70–99)
Glucose-Capillary: 101 mg/dL — ABNORMAL HIGH (ref 70–99)

## 2021-12-25 MED ORDER — INSULIN ASPART 100 UNIT/ML IJ SOLN
0.0000 [IU] | Freq: Three times a day (TID) | INTRAMUSCULAR | Status: DC
  Filled 2021-12-25: qty 0.09

## 2021-12-25 MED ORDER — HYDROMORPHONE HCL 2 MG/ML IJ SOLN
1.0000 mg | INTRAMUSCULAR | Status: AC | PRN
  Administered 2021-12-25: 1 mg via INTRAVENOUS
  Filled 2021-12-25: qty 1

## 2021-12-25 MED ORDER — SODIUM CHLORIDE 0.9 % IV SOLN: INTRAVENOUS | Status: DC

## 2021-12-25 MED ORDER — ACETAMINOPHEN 325 MG PO TABS
650.0000 mg | ORAL_TABLET | Freq: Four times a day (QID) | ORAL | Status: DC | PRN
  Administered 2021-12-26: 650 mg via ORAL
  Filled 2021-12-25 (×2): qty 2

## 2021-12-25 MED ORDER — ACETAMINOPHEN 650 MG RE SUPP: 650.0000 mg | Freq: Four times a day (QID) | RECTAL | Status: DC | PRN

## 2021-12-25 MED ORDER — INSULIN ASPART 100 UNIT/ML IJ SOLN
0.0000 [IU] | Freq: Every day | INTRAMUSCULAR | Status: DC
  Filled 2021-12-25: qty 0.05

## 2021-12-25 MED ORDER — HYDROMORPHONE HCL 1 MG/ML IJ SOLN
1.0000 mg | INTRAMUSCULAR | Status: DC | PRN
  Administered 2021-12-25 – 2021-12-26 (×4): 1 mg via INTRAVENOUS
  Filled 2021-12-25 (×4): qty 1

## 2021-12-25 MED ORDER — HYDRALAZINE HCL 20 MG/ML IJ SOLN
10.0000 mg | Freq: Four times a day (QID) | INTRAMUSCULAR | Status: DC | PRN
  Administered 2021-12-26: 10 mg via INTRAVENOUS
  Filled 2021-12-25: qty 1

## 2021-12-25 MED ORDER — LACTATED RINGERS IV SOLN
INTRAVENOUS | Status: DC
Start: 2021-12-25 — End: 2021-12-25

## 2021-12-25 MED ORDER — CYCLOBENZAPRINE HCL 10 MG PO TABS
10.0000 mg | ORAL_TABLET | Freq: Three times a day (TID) | ORAL | Status: DC | PRN
  Administered 2021-12-25 – 2021-12-27 (×4): 10 mg via ORAL
  Filled 2021-12-25 (×4): qty 1

## 2021-12-25 MED ORDER — HYDROMORPHONE HCL 2 MG/ML IJ SOLN
1.0000 mg | Freq: Once | INTRAMUSCULAR | Status: AC | PRN
  Administered 2021-12-25: 1 mg via INTRAVENOUS
  Filled 2021-12-25: qty 1

## 2021-12-25 MED ORDER — HYDROCODONE-ACETAMINOPHEN 5-325 MG PO TABS
1.0000 | ORAL_TABLET | ORAL | Status: DC | PRN
  Administered 2021-12-25 – 2021-12-26 (×4): 2 via ORAL
  Filled 2021-12-25 (×4): qty 2

## 2021-12-25 MED ORDER — ALBUTEROL SULFATE (2.5 MG/3ML) 0.083% IN NEBU: 2.5000 mg | INHALATION_SOLUTION | Freq: Four times a day (QID) | RESPIRATORY_TRACT | Status: DC | PRN

## 2021-12-25 MED ORDER — ALBUTEROL SULFATE (2.5 MG/3ML) 0.083% IN NEBU: 2.5000 mg | INHALATION_SOLUTION | Freq: Four times a day (QID) | RESPIRATORY_TRACT | Status: DC

## 2021-12-25 MED ORDER — HYDROMORPHONE HCL 2 MG/ML IJ SOLN
1.0000 mg | Freq: Once | INTRAMUSCULAR | Status: AC
  Administered 2021-12-25: 1 mg via INTRAMUSCULAR
  Filled 2021-12-25: qty 1

## 2021-12-25 MED ORDER — CYCLOBENZAPRINE HCL 10 MG PO TABS: 10.0000 mg | ORAL_TABLET | Freq: Every evening | ORAL | Status: DC | PRN

## 2021-12-25 MED ORDER — KETAMINE HCL 50 MG/5ML IJ SOSY
20.0000 mg | PREFILLED_SYRINGE | Freq: Once | INTRAMUSCULAR | Status: AC
  Administered 2021-12-25: 20 mg via INTRAVENOUS
  Filled 2021-12-25: qty 5

## 2021-12-25 NOTE — ED Triage Notes (Signed)
Patient arrived with a surgery on ankle/foot this morning at 10am. Took Oxycodone and ibuprofen at 1030 with no relief. Unable to get in contact with his surgeon.

## 2021-12-25 NOTE — Consult Note (Signed)
PODIATRY CONSULTATION  NAME Troy Arellano MRN [id number] DOB 01-Nov-1965 DOA 12/25/2021   Reason for consult: Postoperative pain Chief Complaint  Patient presents with   Post-op Problem    History of present illness: 56 y.o. male who recently underwent triple arthrodesis with gastroc aponeurosis lengthening to the left lower extremity LT foot, surgeon Dr. Boneta Lucks, DPM, performed outpatient presented to the ED for worsening pain to the surgical extremity.  DOS: 12/24/2021.  ED physician had reached out to me earlier this a.m. and recommended admission for postoperative pain control. Patient resting comfortably in bed this evening.  Afebrile.  No distress.  Past Medical History:  Diagnosis Date   Arthritis    Back   Benign localized prostatic hyperplasia with lower urinary tract symptoms (LUTS)    urologist--- dr Cain Sieve   Bilateral recurrent inguinal hernia    GERD (gastroesophageal reflux disease)    History of adenomatous polyp of colon    History of COVID-19 05/2020   per pt mild symptoms that resovled   Hyperlipidemia    Hypertension    followed by pcp   Type 2 diabetes mellitus ([place])    followed by pcp   (07-17-2021  per pt does not take check blood sugar )       Latest Ref Rng & Units 12/25/2021    1:54 AM 07/20/2021   10:11 AM 05/24/2021   11:22 PM  CBC  WBC 4.0 - 10.5 K/uL 8.0   4.9   Hemoglobin 13.0 - 17.0 g/dL 12.7  15.6  13.8   Hematocrit 39.0 - 52.0 % 38.3  46.0  40.9   Platelets 150 - 400 K/uL 190   225        Latest Ref Rng & Units 12/25/2021    1:54 AM 07/20/2021   10:11 AM 05/24/2021   11:22 PM  BMP  Glucose 70 - 99 mg/dL 129  100  109   BUN 6 - 20 mg/dL '16  14  15   '$ Creatinine 0.61 - 1.24 mg/dL 1.36  0.90  0.95   Sodium 135 - 145 mmol/L 134  138  135   Potassium 3.5 - 5.1 mmol/L 3.5  4.5  3.3   Chloride 98 - 111 mmol/L 103  101  101   CO2 22 - 32 mmol/L 23   26   Calcium 8.9 - 10.3 mg/dL 8.9   9.3          Physical Exam: Neurovascular  status intact.  Incisions are well coapted with staples intact.  There is some slight sanguinous bleeding/drainage from the lateral incision site of the foot.  No malodor.  Moderate edema.  No significant erythema.  Clinically no indication of infection   ASSESSMENT/PLAN OF CARE POV #1 triple arthrodesis w/ gastroc lengthening LT DOS: 12/24/2021 AM -Continue Flexeril 10 mgTID PRN muscle spasms -Continue Dilaudid '1mg'$  IV Q4H PRN breakthrough pain.  Okay to DC -Although the patient states that the Percocet 5/325 mg did not alleviate his pain when the nerve block wore off, I would recommend transitioning the patient from Vicodin 5/325 mg to Percocet 10/325 mg Q4H PRN in preparation for discharge -When pain is tolerated with p.o. Percocet 10/'325mg'$  and no longer taking the Dilaudid, okay for discharge -Dressings were changed.  Leave clean dry and intact until next appointment in the office. -Strict nonweightbearing surgical extremity in the cam boot     Thank you for the consult.  Please contact me with any questions or concerns.  Edrick Kins, DPM [facility]  Dr. Edrick Kins, DPM    [address], [place] [address]                Office [phone]  Fax [phone]

## 2021-12-25 NOTE — ED Provider Notes (Signed)
[facility] DEPT Provider Note   CSN: [id number] Arrival date & time: 12/25/21  0020     History  Chief Complaint  Patient presents with   Post-op Problem    Troy Arellano is a 56 y.o. male.  56 yo M with a chief complaints of left foot pain.  The patient had a foot procedure done this morning at the surgical center for [facility].  His pain was modestly controlled and then got significantly worse throughout the evening.  They tried calling the anesthesiologist and tried calling the office and then eventually decided to call 911 to come here.  Patient describes sharp and searing pain about the entire foot.  Nothing seems to make it better.  Tried oxycodone and Motrin without improvement.        Home Medications Prior to Admission medications   Medication Sig Start Date End Date Taking? Authorizing Provider  atorvastatin (LIPITOR) 20 MG tablet Take 1 tablet (20 mg total) by mouth daily. Patient taking differently: Take 1 tablet by mouth daily. 08/23/20   [name], PA  cyclobenzaprine (FLEXERIL) 10 MG tablet Take 1 tablet (10 mg total) by mouth at bedtime. Patient taking differently: Take 10 mg by mouth at bedtime as needed. 05/17/21   White, Leitha Schuller, NP  doxycycline (VIBRA-TABS) 100 MG tablet Take 1 tablet (100 mg total) by mouth 2 (two) times daily. 10/10/21   [name], PA  hydrochlorothiazide (HYDRODIURIL) 25 MG tablet TAKE 1 TABLET BY MOUTH EVERY DAY 12/13/21   [name], PA  ibuprofen (ADVIL) 200 MG tablet Take 200 mg by mouth every 6 (six) hours as needed.    [provider]  ibuprofen (ADVIL) 800 MG tablet Take 1 tablet (800 mg total) by mouth every 6 (six) hours as needed. 12/24/21   Felipa Furnace, DPM  metFORMIN (GLUCOPHAGE) 500 MG tablet Take 1 tablet (500 mg total) by mouth daily with breakfast. Patient taking differently: Take 500 mg by mouth daily with breakfast. 08/23/20   [name], PA   mometasone (NASONEX) 50 MCG/ACT nasal spray 2 sprays daily as needed. 09/04/20   [provider]  oxyCODONE-acetaminophen (PERCOCET) 5-325 MG tablet Take 1 tablet by mouth every 4 (four) hours as needed for severe pain. 11/15/21   Felipa Furnace, DPM  oxyCODONE-acetaminophen (PERCOCET) 5-325 MG tablet Take 1 tablet by mouth every 4 (four) hours as needed for severe pain. 12/18/21   Felipa Furnace, DPM  oxyCODONE-acetaminophen (PERCOCET) 5-325 MG tablet Take 1 tablet by mouth every 4 (four) hours as needed for severe pain. 12/24/21   Felipa Furnace, DPM  pantoprazole (PROTONIX) 40 MG tablet Take 1 tablet (40 mg total) by mouth daily. 10/10/21   [name], PA      Allergies    Patient has no known allergies.    Review of Systems   Review of Systems  Physical Exam Updated Vital Signs BP (!) 150/71   Pulse 87   Temp 98.3 F (36.8 C) (Oral)   Resp 20   SpO2 92%  Physical Exam Vitals and nursing note reviewed.  Constitutional:      Appearance: He is well-developed.  HENT:     Head: Normocephalic and atraumatic.  Eyes:     Pupils: Pupils are equal, round, and reactive to light.  Neck:     Vascular: No JVD.  Cardiovascular:     Rate and Rhythm: Normal rate and regular rhythm.     Heart sounds: No  murmur heard.    No friction rub. No gallop.  Pulmonary:     Effort: No respiratory distress.     Breath sounds: No wheezing.  Abdominal:     General: There is no distension.     Tenderness: There is no abdominal tenderness. There is no guarding or rebound.  Musculoskeletal:        General: Normal range of motion.     Cervical back: Normal range of motion and neck supple.     Comments: Left lower extremity is in a boot.  Exposed skin without any erythema or warmth.  Pulse motor and sensation intact distally.  Skin:    Coloration: Skin is not pale.     Findings: No rash.  Neurological:     Mental Status: He is alert and oriented to person, place, and time.  Psychiatric:         Behavior: Behavior normal.     ED Results / Procedures / Treatments   Labs (all labs ordered are listed, but only abnormal results are displayed) Labs Reviewed  CBC WITH DIFFERENTIAL/PLATELET - Abnormal; Notable for the following components:      Result Value   Hemoglobin 12.7 (*)    HCT 38.3 (*)    All other components within normal limits  BASIC METABOLIC PANEL - Abnormal; Notable for the following components:   Sodium 134 (*)    Glucose, Bld 129 (*)    Creatinine, Ser 1.36 (*)    All other components within normal limits    EKG None  Radiology No results found.  Procedures Procedures    Medications Ordered in ED Medications  lactated ringers infusion (has no administration in time range)  HYDROmorphone (DILAUDID) injection 1 mg (1 mg Intramuscular Given 12/25/21 0125)  ketamine 50 mg in normal saline 5 mL (10 mg/mL) syringe (20 mg Intravenous Given 12/25/21 0243)    ED Course/ Medical Decision Making/ A&P                           Medical Decision Making Amount and/or Complexity of Data Reviewed Labs: ordered.  Risk Prescription drug management. Decision regarding hospitalization.   56 yo M with a chief complaints of left foot and ankle pain after having a foot surgery done this morning.  I am unable to see the records from the procedure that was performed, patient appears to be somewhat uncomfortable on initial exam.  I discussed the case with Dr. Amalia Hailey, podiatry he felt that the patient's pain was uncontrolled then it would be reasonable to bring him into the hospital and he would round on him in the morning.  Reassessed the patient after milligram of Dilaudid still tells me he has uncontrolled pain we will try pain dose ketamine.  Blood work with modest drop in his hemoglobin.  Mild AKI.  Will discuss with medicine for admission.  The patients results and plan were reviewed and discussed.   Any x-rays performed were independently reviewed by myself.    Differential diagnosis were considered with the presenting HPI.  Medications  lactated ringers infusion (has no administration in time range)  HYDROmorphone (DILAUDID) injection 1 mg (1 mg Intramuscular Given 12/25/21 0125)  ketamine 50 mg in normal saline 5 mL (10 mg/mL) syringe (20 mg Intravenous Given 12/25/21 0243)    Vitals:   12/25/21 0300 12/25/21 0315 12/25/21 0330 12/25/21 0345  BP: (!) 140/64 136/66 137/80 (!) 150/71  Pulse: 80 82 81 87  Resp:      Temp:      TempSrc:      SpO2: 91% 90% 93% 92%    Final diagnoses:  Post-operative pain    Admission/ observation were discussed with the admitting physician, patient and/or family and they are comfortable with the plan.          Final Clinical Impression(s) / ED Diagnoses Final diagnoses:  Post-operative pain    Rx / DC Orders ED Discharge Orders     None         Deno Etienne, DO 12/25/21 [phone]

## 2021-12-25 NOTE — Progress Notes (Signed)
Called ortho tech, wll bring crutches for patient to remain non weight bearing.     Per wife, boot is not to be removed except by doctor because it is acting as a cast.

## 2021-12-25 NOTE — Progress Notes (Signed)
Orthopedic Tech Progress Note Patient Details:  Troy Arellano 13-Mar-1966 [id number]  Patient ID: Troy Arellano, male   DOB: 1966/02/10, 56 y.o.   MRN: [id number]  Troy Arellano 12/25/2021, 6:47 PM Crutches adjusted and delivered to room per RN

## 2021-12-25 NOTE — H&P (Signed)
[facility] History and Physical  Troy Arellano FWY:[id number] DOB: 01/05/1966 DOA: 12/25/2021 PCP: [name], PA  Admitted from: Home Chief Complaint: Intractable pain  History of Present Illness: Troy Arellano is a 56 y.o. 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  He was seeing podiatrist Dr. Posey Pronto as an outpatient and chose to manage with nonsurgical measures but eventually required surgical intervention.  He had surgery done yesterday morning 8/21 at [facility].  For a few hours after the surgery, his pain was controlled because of the nerve block.  However towards the evening, his pain got significantly worse.  It did not improve with oxycodone and Motrin.  He tried calling the anesthetist in the office and eventually 911 and decided to come to the ED.  In the ED, patient was afebrile, heart rate in 80s, blood pressure in 150s, breathing on room air Labs sodium 134, BUN/creatinine 16/1.36, hemoglobin 12.7, glucose 129.  ED physician discussed the case with podiatrist Dr. Amalia Hailey.  Recommended to keep in the hospital for pain control.  To be evaluated by podiatry this morning. Hospitalist service consulted last night for observation.  I received the case as a carryover from last night. At the time of my evaluation, patient was lying down in bed.  Left lower extremity has a boot on.  He has kept it elevated.  Still complains of pain. Pending evaluation by podiatry this morning.  Review of Systems:  All systems were reviewed and were negative unless otherwise mentioned in the HPI   Past medical history: Past Medical History:  Diagnosis Date   Arthritis    Back   Benign localized prostatic hyperplasia with lower urinary tract symptoms (LUTS)    urologist--- dr Cain Sieve   Bilateral recurrent inguinal hernia    GERD (gastroesophageal reflux disease)    History of adenomatous polyp of  colon    History of COVID-19 05/2020   per pt mild symptoms that resovled   Hyperlipidemia    Hypertension    followed by pcp   Type 2 diabetes mellitus ([place])    followed by pcp   (07-17-2021  per pt does not take check blood sugar )    Past surgical history: Past Surgical History:  Procedure Laterality Date   COLONOSCOPY  12/28/2020   by dr h. danis   INGUINAL HERNIA REPAIR Bilateral    right 1987 and left 1988   INGUINAL HERNIA REPAIR Bilateral 07/20/2021   Procedure: LAPAROSCOPIC RECURRENT BILATERAL INGUINAL HERNIA REPAIR WITH MESH;  Surgeon: Stechschulte, Nickola Major, MD;  Location: [facility];  Service: General;  Laterality: Bilateral;    Social History:  reports that he has never smoked. He has never used smokeless tobacco. He reports current alcohol use of about 2.0 - 3.0 standard drinks of alcohol per week. He reports that he does not use drugs.  Allergies:  No Known Allergies Patient has no known allergies.   Family history:  Family History  Problem Relation Age of Onset   Hypertension Mother    Hypertension Father    Heart failure Father    Lymphoma Sister    Colon cancer Neg Hx    Esophageal cancer Neg Hx    Rectal cancer Neg Hx    Stomach cancer Neg Hx    Colon polyps Neg Hx      Home Meds: Prior to Admission medications   Medication Sig Start  Date End Date Taking? Authorizing Provider  atorvastatin (LIPITOR) 20 MG tablet Take 1 tablet (20 mg total) by mouth daily. Patient taking differently: Take 1 tablet by mouth daily. 08/23/20   [name], PA  cyclobenzaprine (FLEXERIL) 10 MG tablet Take 1 tablet (10 mg total) by mouth at bedtime. Patient taking differently: Take 10 mg by mouth at bedtime as needed. 05/17/21   White, Leitha Schuller, NP  doxycycline (VIBRA-TABS) 100 MG tablet Take 1 tablet (100 mg total) by mouth 2 (two) times daily. 10/10/21   [name], PA  hydrochlorothiazide (HYDRODIURIL) 25 MG tablet TAKE 1 TABLET BY MOUTH EVERY  DAY 12/13/21   [name], PA  ibuprofen (ADVIL) 200 MG tablet Take 200 mg by mouth every 6 (six) hours as needed.    [provider]  ibuprofen (ADVIL) 800 MG tablet Take 1 tablet (800 mg total) by mouth every 6 (six) hours as needed. 12/24/21   Felipa Furnace, DPM  metFORMIN (GLUCOPHAGE) 500 MG tablet Take 1 tablet (500 mg total) by mouth daily with breakfast. Patient taking differently: Take 500 mg by mouth daily with breakfast. 08/23/20   [name], PA  mometasone (NASONEX) 50 MCG/ACT nasal spray 2 sprays daily as needed. 09/04/20   [provider]  oxyCODONE-acetaminophen (PERCOCET) 5-325 MG tablet Take 1 tablet by mouth every 4 (four) hours as needed for severe pain. 11/15/21   Felipa Furnace, DPM  oxyCODONE-acetaminophen (PERCOCET) 5-325 MG tablet Take 1 tablet by mouth every 4 (four) hours as needed for severe pain. 12/18/21   Felipa Furnace, DPM  oxyCODONE-acetaminophen (PERCOCET) 5-325 MG tablet Take 1 tablet by mouth every 4 (four) hours as needed for severe pain. 12/24/21   Felipa Furnace, DPM  pantoprazole (PROTONIX) 40 MG tablet Take 1 tablet (40 mg total) by mouth daily. 10/10/21   [name], PA    Physical Exam: Vitals:   12/25/21 0450 12/25/21 0515 12/25/21 0600 12/25/21 0803  BP:  (!) 168/89 (!) 161/76   Pulse:  83 96   Resp:   18   Temp:    98.6 F (37 C)  TempSrc:    Oral  SpO2: 94% 94% 96%   Weight:    102.1 kg  Height:    '5\' 7"'$  (1.702 m)   Wt Readings from Last 3 Encounters:  12/25/21 102.1 kg  10/13/21 99.8 kg  10/10/21 104.1 kg   Body mass index is 35.24 kg/m.  General exam: Pleasant, middle-aged African-American male. Skin: No rashes, lesions or ulcers. HEENT: Atraumatic, normocephalic, no obvious bleeding Lungs: Clear to auscultation bilaterally CVS: Regular rate and rhythm, no murmur GI/Abd soft, nontender, nondistended, bowel sound present CNS: Alert, awake, oriented x3 Psychiatry: Mood appropriate Extremities: No pedal  edema, no calf tenderness.  Left foot on boot.     Consult Orders  (From admission, onward)           Start     Ordered   12/25/21 0225  Consult to hospitalist  Once       Provider:  (Not yet assigned)  Question Answer Comment  Place call to: [facility]   Reason for Consult Admit      12/25/21 0226            Labs on Admission:   CBC: Recent Labs  Lab 12/25/21 0154  WBC 8.0  NEUTROABS 6.5  HGB 12.7*  HCT 38.3*  MCV 85.7  PLT 623    Basic Metabolic Panel: Recent Labs  Lab  12/25/21 0154  NA 134*  K 3.5  CL 103  CO2 23  GLUCOSE 129*  BUN 16  CREATININE 1.36*  CALCIUM 8.9    Liver Function Tests: No results for input(s): "AST", "ALT", "ALKPHOS", "BILITOT", "PROT", "ALBUMIN" in the last 168 hours. No results for input(s): "LIPASE", "AMYLASE" in the last 168 hours. No results for input(s): "AMMONIA" in the last 168 hours.  Cardiac Enzymes: No results for input(s): "CKTOTAL", "CKMB", "CKMBINDEX", "TROPONINI" in the last 168 hours.  BNP (last 3 results) No results for input(s): "BNP" in the last 8760 hours.  ProBNP (last 3 results) No results for input(s): "PROBNP" in the last 8760 hours.  CBG: Recent Labs  Lab 12/25/21 0838  GLUCAP 116*    Lipase     Component Value Date/Time   LIPASE 31 05/24/2021 2322     Urinalysis    Component Value Date/Time   COLORURINE YELLOW 05/28/2021 1151   APPEARANCEUR CLEAR 05/28/2021 1151   LABSPEC 1.015 05/28/2021 1151   PHURINE 8.0 05/28/2021 1151   GLUCOSEU NEGATIVE 05/28/2021 1151   HGBUR NEGATIVE 05/28/2021 1151   BILIRUBINUR NEGATIVE 05/28/2021 1151   KETONESUR NEGATIVE 05/28/2021 1151   PROTEINUR NEGATIVE 05/25/2021 0225   UROBILINOGEN 0.2 05/28/2021 1151   NITRITE NEGATIVE 05/28/2021 1151   LEUKOCYTESUR NEGATIVE 05/28/2021 1151     Drugs of Abuse  No results found for: "LABOPIA", "COCAINSCRNUR", "LABBENZ", "AMPHETMU", "THCU", "LABBARB"    Radiological Exams on Admission: No  results found.   ------------------------------------------------------------------------------------------------------ Assessment/Plan: Principal Problem:   Left foot pain Active Problems:   Intractable pain  Intractable postsurgical pain 8/21, underwent out surgery for left flatfoot by Dr. Posey Pronto Presented with intractable pain not responding to oral pain meds ED physician discussed the case with podiatrist Dr. Amalia Hailey.  Recommended to keep in the hospital for pain control.  To be evaluated by podiatry this morning. I would leave him n.p.o. for now for possible procedure Started on IV Dilaudid for pain control  AKI Baseline creatinine normal.  Presented with creatinine of 1.36.  Expect improvement with IV fluid. Recent Labs    05/24/21 2322 07/20/21 1011 12/25/21 0154  BUN '15 14 16  '$ CREATININE 0.95 0.90 1.36*   Type 2 diabetes mellitus A1c 6.2 in January 2023 PTA on metformin.  Keep it on hold Start sliding scale insulin with Accu-Cheks Lab Results  Component Value Date   HGBA1C 6.1 (H) 12/25/2021   Recent Labs  Lab 12/25/21 0838  GLUCAP 116*    Essential hypertension PTA on HCTZ Hold HCTZ because of AKI.  Continue to monitor.  Hydralazine as needed  Hyperlipidemia Apparently not taking Lipitor  Mobility Needs PT eval postprocedure  Goals of care   Code Status: Full Code   Diet:  Diet Order             Diet NPO time specified  Diet effective now                  DVT prophylaxis:  SCDs Start: 12/25/21 4166   Antimicrobials: None Fluid: NS at 75 mill per hour Consultants: Podiatry Family Communication: None at bedside Dispo: The patient is from: Home              Anticipated d/c is to: Home, pending clinical course  ------------------------------------------------------------------------------------- Severity of Illness: The appropriate patient status for this patient is OBSERVATION. Observation status is judged to be reasonable and necessary  in order to provide the required intensity of service to ensure the patient's  safety. The patient's presenting symptoms, physical exam findings, and initial radiographic and laboratory data in the context of their medical condition is felt to place them at decreased risk for further clinical deterioration. Furthermore, it is anticipated that the patient will be medically stable for discharge from the hospital within 2 midnights of admission.    Signed, Terrilee Croak, MD [facility] 12/25/2021

## 2021-12-25 NOTE — Telephone Encounter (Signed)
Pts wife called and states pt had SX with Dr Posey Pronto on 12/24/21 and pt had to be transported by ambulance last night to hospital. The ER admitted pt and he is upset that no one from podiatry has come to his room to check on him as of yet. He is currently in the hospital and states the nurse has spoken to Dr Amalia Hailey but pt would like to know if Dr Amalia Hailey will come to his room.    Please advise

## 2021-12-26 DIAGNOSIS — M79672 Pain in left foot: Secondary | ICD-10-CM | POA: Diagnosis not present

## 2021-12-26 LAB — CBC
HCT: 37 % — ABNORMAL LOW (ref 39.0–52.0)
Hemoglobin: 12.2 g/dL — ABNORMAL LOW (ref 13.0–17.0)
MCH: 28.4 pg (ref 26.0–34.0)
MCHC: 33 g/dL (ref 30.0–36.0)
MCV: 86.2 fL (ref 80.0–100.0)
Platelets: 195 10*3/uL (ref 150–400)
RBC: 4.29 MIL/uL (ref 4.22–5.81)
RDW: 14.1 % (ref 11.5–15.5)
WBC: 7 10*3/uL (ref 4.0–10.5)
nRBC: 0 % (ref 0.0–0.2)

## 2021-12-26 LAB — BASIC METABOLIC PANEL
Anion gap: 8 (ref 5–15)
BUN: 11 mg/dL (ref 6–20)
CO2: 24 mmol/L (ref 22–32)
Calcium: 8.6 mg/dL — ABNORMAL LOW (ref 8.9–10.3)
Chloride: 104 mmol/L (ref 98–111)
Creatinine, Ser: 0.88 mg/dL (ref 0.61–1.24)
GFR, Estimated: >60 mL/min (ref >60)
Glucose, Bld: 108 mg/dL — ABNORMAL HIGH (ref 70–99)
Potassium: 3.4 mmol/L — ABNORMAL LOW (ref 3.5–5.1)
Sodium: 136 mmol/L (ref 135–145)

## 2021-12-26 LAB — GLUCOSE, CAPILLARY
Glucose-Capillary: 96 mg/dL (ref 70–99)
Glucose-Capillary: 99 mg/dL (ref 70–99)
Glucose-Capillary: 106 mg/dL — ABNORMAL HIGH (ref 70–99)
Glucose-Capillary: 112 mg/dL — ABNORMAL HIGH (ref 70–99)

## 2021-12-26 MED ORDER — SENNOSIDES-DOCUSATE SODIUM 8.6-50 MG PO TABS
1.0000 | ORAL_TABLET | Freq: Every day | ORAL | Status: DC
  Administered 2021-12-26: 1 via ORAL
  Filled 2021-12-26: qty 1

## 2021-12-26 MED ORDER — POTASSIUM CHLORIDE CRYS ER 20 MEQ PO TBCR
40.0000 meq | EXTENDED_RELEASE_TABLET | Freq: Once | ORAL | Status: AC
  Administered 2021-12-26: 40 meq via ORAL
  Filled 2021-12-26: qty 2

## 2021-12-26 MED ORDER — POLYETHYLENE GLYCOL 3350 17 G PO PACK
17.0000 g | PACK | Freq: Every day | ORAL | Status: DC | PRN
  Administered 2021-12-26 – 2021-12-27 (×2): 17 g via ORAL
  Filled 2021-12-26 (×2): qty 1

## 2021-12-26 MED ORDER — OXYCODONE-ACETAMINOPHEN 5-325 MG PO TABS
1.0000 | ORAL_TABLET | ORAL | Status: DC | PRN
  Administered 2021-12-26 – 2021-12-27 (×7): 2 via ORAL
  Filled 2021-12-26 (×8): qty 2

## 2021-12-26 MED ORDER — MORPHINE SULFATE (PF) 2 MG/ML IV SOLN
1.0000 mg | INTRAVENOUS | Status: DC | PRN
  Administered 2021-12-26 – 2021-12-27 (×4): 1 mg via INTRAVENOUS
  Filled 2021-12-26 (×4): qty 1

## 2021-12-26 NOTE — Progress Notes (Signed)
PROGRESS NOTE  Troy Arellano  DOB: 20-Dec-1965  PCP: [name], [place] ATF:[id number]  DOA: 12/25/2021  LOS: 1 day  Hospital Day: 2  Brief narrative:  Troy Arellano is a 56 y.o. 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  He was seeing podiatrist Dr. Posey Pronto as an outpatient and chose to manage with nonsurgical measures but eventually required surgical intervention.  He had surgery done yesterday morning 8/21 at [facility].  For a few hours after the surgery, his pain was controlled because of the nerve block.  However towards the evening, his pain got significantly worse.  It did not improve with oxycodone and Motrin.  He tried calling the anesthetist in the office and eventually 911 and decided to come to the ED.  In the ED, patient was afebrile, heart rate in 80s, blood pressure in 150s, breathing on room air Labs sodium 134, BUN/creatinine 16/1.36, hemoglobin 12.7, glucose 129.  ED physician discussed the case with podiatrist Dr. Amalia Hailey.  Recommended to keep in the hospital for pain control.  Admitted to hospital service for further evaluation management  Subjective: Patient was seen and examined this morning.  Lying on bed.  Still complains of excruciating intermittent pain.  Per discussion with podiatry, I switched him from Vicodin to oral Percocet and stopped IV Dilaudid.  Patient is complaining of severe pain and wants IV pain meds resumed.  I started him on IV morphine low-dose as needed  Assessment/Plan: Principal Problem:   Left foot pain Active Problems:   Intractable pain   Post-operative pain  Intractable postsurgical pain 8/21, underwent out surgery for left flatfoot by Dr. Posey Pronto Presented with intractable pain not responding to oral pain meds Admitted and started on pain medicines oral and IV.  Podiatry consultation obtained. Currently he is being transitioned from IV to oral  pain meds.  No need of repeat surgical intervention at this time. Plan to discharge home tomorrow if pain control is adequate.  AKI Baseline creatinine normal.  Presented with creatinine of 1.36.  Expect improvement with IV fluid. Recent Labs    05/24/21 2322 07/20/21 1011 12/25/21 0154 12/26/21 0601  BUN '15 14 16 11  '$ CREATININE 0.95 0.90 1.36* 0.88   Type 2 diabetes mellitus A1c 6.1 in 12/25/2021 PTA on metformin.  Keep it on hold Start sliding scale insulin with Accu-Cheks. Recent Labs  Lab 12/25/21 1142 12/25/21 1609 12/25/21 2108 12/26/21 0737 12/26/21 1146  GLUCAP 101* 112* 104* 96 99   Essential hypertension PTA on HCTZ HCTZ on hold because of AKI.  Continue to monitor.  Hydralazine as needed  Hyperlipidemia Apparently not taking Lipitor  Mobility Needs PT eval postprocedure  Goals of care   Code Status: Full Code   Diet:  Diet Order             Diet Carb Modified Fluid consistency: Thin; Room service appropriate? Yes  Diet effective now                  DVT prophylaxis:  SCDs Start: 12/25/21 0727   Antimicrobials: None Fluid: NS at 75 mill per hour.  Can stop IV fluid Consultants: Podiatry Family Communication: None at bedside  Status is: Inpatient  Continue in-hospital care because: Requiring IV pain meds Level of care: Med-Surg   Dispo: The patient is from: Home              Anticipated d/c  is to: Hopefully home tomorrow              Patient currently is not medically stable to d/c.   Difficult to place patient No     Infusions:    Scheduled Meds:  insulin aspart  0-5 Units Subcutaneous QHS   insulin aspart  0-9 Units Subcutaneous TID WC   senna-docusate  1 tablet Oral QHS    PRN meds: acetaminophen **OR** acetaminophen, albuterol, cyclobenzaprine, hydrALAZINE, morphine injection, oxyCODONE-acetaminophen, polyethylene glycol   Antimicrobials: Anti-infectives (From admission, onward)    None       Objective: Vitals:    12/26/21 0343 12/26/21 1430  BP: (!) 168/82 (!) 152/77  Pulse:  91  Resp: 18 (!) 22  Temp:  98.7 F (37.1 C)  SpO2: 96% 97%    Intake/Output Summary (Last 24 hours) at 12/26/2021 1518 Last data filed at 12/26/2021 8182 Gross per 24 hour  Intake 1292.79 ml  Output 750 ml  Net 542.79 ml   Filed Weights   12/25/21 0803  Weight: 102.1 kg   Weight change:  Body mass index is 35.24 kg/m.   Physical Exam: General exam: Pleasant, middle-aged African-American male. Skin: No rashes, lesions or ulcers. HEENT: Atraumatic, normocephalic, no obvious bleeding Lungs: Clear to auscultation bilaterally CVS: Regular rate and rhythm, no murmur GI/Abd soft, nontender, nondistended, bowel sound present CNS: Alert, awake, oriented x3 Psychiatry: Mood appropriate Extremities: No pedal edema, no calf tenderness.  Left foot on boot.  Data Review: I have personally reviewed the laboratory data and studies available.  F/u labs  Unresulted Labs (From admission, onward)    None       Signed, Terrilee Croak, MD [facility] 12/26/2021

## 2021-12-26 NOTE — Telephone Encounter (Signed)
Called to notify that Dr Amalia Hailey will be in to visit him at hospital today,verbalized understanding.

## 2021-12-27 ENCOUNTER — Telehealth: Payer: Self-pay | Admitting: *Deleted

## 2021-12-27 ENCOUNTER — Telehealth: Payer: Self-pay

## 2021-12-27 DIAGNOSIS — M79672 Pain in left foot: Secondary | ICD-10-CM | POA: Diagnosis not present

## 2021-12-27 LAB — GLUCOSE, CAPILLARY
Glucose-Capillary: 101 mg/dL — ABNORMAL HIGH (ref 70–99)
Glucose-Capillary: 91 mg/dL (ref 70–99)

## 2021-12-27 MED ORDER — OXYCODONE-ACETAMINOPHEN 10-325 MG PO TABS: 1.0000 | ORAL_TABLET | Freq: Four times a day (QID) | ORAL | 0 refills | Status: AC | PRN

## 2021-12-27 MED ORDER — SENNOSIDES-DOCUSATE SODIUM 8.6-50 MG PO TABS: 1.0000 | ORAL_TABLET | Freq: Every day | ORAL | 0 refills | Status: AC

## 2021-12-27 MED ORDER — BISACODYL 5 MG PO TBEC
5.0000 mg | DELAYED_RELEASE_TABLET | Freq: Once | ORAL | Status: AC
  Administered 2021-12-27: 5 mg via ORAL
  Filled 2021-12-27: qty 1

## 2021-12-27 MED ORDER — ENOXAPARIN SODIUM 40 MG/0.4ML IJ SOSY
40.0000 mg | PREFILLED_SYRINGE | INTRAMUSCULAR | Status: DC
  Administered 2021-12-27: 40 mg via SUBCUTANEOUS
  Filled 2021-12-27: qty 0.4

## 2021-12-27 NOTE — Telephone Encounter (Signed)
Patient is calling to ask if he can remove his boot, not in pain but did not know if he should be wearing all the time Spoke with patient encouraging him to keep wearing his boot, foot elevated and icing, if uncomfortable, may loosen up ace wrap on foot, place boot back on. He verbalized understanding.

## 2021-12-27 NOTE — Progress Notes (Signed)
Pt discharged home, f/u appointment with podiatry arranged.  AVS reviewed with pt, medications reviewed.  No questions or concerns at time of departure.  Transported by wife via private vehicle, sent with all belongings.

## 2021-12-27 NOTE — Progress Notes (Signed)
  Transition of Care [facility]) Screening Note   Patient Details  Name: Troy Arellano Date of Birth: [birth year]   Transition of Care [facility]) CM/SW Contact:    Vassie Moselle, LCSW Phone Number: 12/27/2021, 8:17 AM    Transition of Care Department [facility]) has reviewed patient and no TOC needs have been identified at this time. We will continue to monitor patient advancement through interdisciplinary progression rounds. If new patient transition needs arise, please place a TOC consult.

## 2021-12-27 NOTE — Telephone Encounter (Signed)
Troy Arellano called to let Dr. Posey Pronto know that he is being discharged from the hospital today and his toes are still numb. He would like to know what he needs to do.

## 2021-12-27 NOTE — Discharge Summary (Signed)
Physician Discharge Summary  [name] YKZ:[id number] DOB: 05/28/1965 DOA: 12/25/2021  PCP: [name], PA  Admit date: 12/25/2021 Discharge date: 12/27/2021  Admitted From: Home Discharge disposition: Home  Recommendations at discharge:  Judicious use of pain medicines Follow-up with podiatry as an outpatient    Brief narrative:  Troy Arellano is a 56 y.o. 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  He was seeing podiatrist Dr. Posey Pronto as an outpatient and chose to manage with nonsurgical measures but eventually required surgical intervention.  He had surgery done yesterday morning 8/21 at [facility].  For a few hours after the surgery, his pain was controlled because of the nerve block.  However towards the evening, his pain got significantly worse.  It did not improve with oxycodone and Motrin.  He tried calling the anesthetist in the office and eventually 911 and decided to come to the ED.  In the ED, patient was afebrile, heart rate in 80s, blood pressure in 150s, breathing on room air Labs sodium 134, BUN/creatinine 16/1.36, hemoglobin 12.7, glucose 129.  ED physician discussed the case with podiatrist Dr. Amalia Hailey.  Recommended to keep in the hospital for pain control.  Admitted to hospital service for further evaluation management  Subjective: Patient was seen and examined this morning.   Still requiring low-dose IV morphine use.  Also using oral Percocet.  But he feels comfortable for discharge with oral Percocet today.    Assessment/Plan: Principal Problem:   Left foot pain Active Problems:   Intractable pain   Post-operative pain  Intractable postsurgical pain 8/21, underwent out surgery for left flatfoot by Dr. Posey Pronto Presented with intractable pain not responding to oral pain meds Admitted and started on pain medicines oral and IV.  Podiatry consultation  obtained. Currently using low-dose IV and oral pain medicines.  Feels comfortable with discharge today on oral pain meds. Moves with crutches  AKI Baseline creatinine normal.  Presented with creatinine of 1.36.  Expect improvement with IV fluid. Recent Labs    05/24/21 2322 07/20/21 1011 12/25/21 0154 12/26/21 0601  BUN '15 14 16 11  '$ CREATININE 0.95 0.90 1.36* 0.88   Type 2 diabetes mellitus A1c 6.1 in 12/25/2021 PTA on metformin.  Continue same post discharge.   Recent Labs  Lab 12/26/21 1146 12/26/21 1650 12/26/21 2120 12/27/21 0747 12/27/21 1132  GLUCAP 99 106* 112* 101* 91   Essential hypertension PTA on HCTZ Continue the same.  Hyperlipidemia Apparently not taking Lipitor  Constipation No BM in last few days.  Takes MiraLAX at home.  1 dose of Dulcolax given today.  Add senna to the regimen.  Wounds:  - Incision - 3 Ports Abdomen Umbilicus Left Right (Active)  Placement Date/Time: 07/20/21 1104   Location of Ports: Abdomen  Location Orientation: Umbilicus  Location Orientation: Left  Location Orientation: Right    Assessments 07/20/2021 12:19 PM 07/20/2021  2:00 PM  Port 1 Site Assessment Clean;Dry Clean;Dry  Port 1 Margins Attached edges (approximated) Attached edges (approximated)  Port 1 Drainage Amount None None  Port 1 Dressing Type Liquid skin adhesive Liquid skin adhesive  Port 1 Dressing Status Clean, Dry, Intact Clean, Dry, Intact  Port 2 Site Assessment Clean;Dry Clean;Dry  Port 2 Margins Attached edges (approximated) Attached edges (approximated)  Port 2 Drainage Amount None None  Port 2 Dressing Type Liquid skin adhesive Liquid skin adhesive  Port 2 Dressing Status Clean, Dry,  Intact Clean, Dry, Intact  Port 3 Site Assessment Clean;Dry Clean;Dry  Port 3 Margins Attached edges (approximated) Attached edges (approximated)  Port 3 Drainage Amount None None  Port 3 Dressing Type Liquid skin adhesive Liquid skin adhesive  Port 3 Dressing Status Clean,  Dry, Intact Clean, Dry, Intact     No associated orders.     Incision (Closed) 07/20/21 Abdomen (Active)  Date First Assessed/Time First Assessed: 07/20/21 1105   Location: Abdomen    No assessment data to display     No associated orders.     Incision (Closed) 12/27/21 Foot Anterior;Left;Medial;Lateral (Active)  Date First Assessed/Time First Assessed: 12/27/21 0846   Location: Foot  Location Orientation: Anterior;Left;Medial;Lateral  Present on Admission: Yes    Assessments 12/27/2021  8:41 AM  Dressing Clean, Dry, Intact     No associated orders.    Discharge Exam:   Vitals:   12/26/21 1823 12/26/21 1944 12/27/21 0434 12/27/21 1223  BP:  (!) 146/82 (!) 142/88 130/79  Pulse:  79 78 75  Resp:  '18 18 18  '$ Temp: 99.6 F (37.6 C) 98.5 F (36.9 C) 99.4 F (37.4 C) 98.3 F (36.8 C)  TempSrc: Oral Oral Oral Oral  SpO2:  98% 98% 98%  Weight:      Height:        Body mass index is 35.24 kg/m.   General exam: Pleasant, middle-aged African-American male. Skin: No rashes, lesions or ulcers. HEENT: Atraumatic, normocephalic, no obvious bleeding Lungs: Clear to auscultation bilaterally CVS: Regular rate and rhythm, no murmur GI/Abd soft, nontender, nondistended, bowel sound present CNS: Alert, awake, oriented x3 Psychiatry: Mood appropriate Extremities: No pedal edema, no calf tenderness.  Left foot on boot.  Follow ups:    Follow-up Information     [name], [place] Follow up.   Specialty: Physician Assistant Contact information: [place] [address] [phone]         Felipa Furnace, DPM Follow up.   Specialty: Podiatry Contact information: [place] [address] [phone]                 Discharge Instructions:   Discharge Instructions     Call MD for:  difficulty breathing, headache or visual disturbances   Complete by: As directed    Call MD for:  extreme fatigue   Complete by: As directed     Call MD for:  hives   Complete by: As directed    Call MD for:  persistant dizziness or light-headedness   Complete by: As directed    Call MD for:  persistant nausea and vomiting   Complete by: As directed    Call MD for:  severe uncontrolled pain   Complete by: As directed    Call MD for:  temperature >100.4   Complete by: As directed    Diet - low sodium heart healthy   Complete by: As directed    Diet Carb Modified   Complete by: As directed    Discharge instructions   Complete by: As directed    Recommendations at discharge:   Judicious use of pain medicines  Follow-up with podiatry as an outpatient  General discharge instructions: Follow with Primary MD [name], PA in 7 days  Please request your PCP  to go over your hospital tests, procedures, radiology results at the follow up. Please get your medicines reviewed and adjusted.  Your PCP may decide to repeat certain labs or tests as  needed. Do not drive, operate heavy machinery, perform activities at heights, swimming or participation in water activities or provide baby sitting services if your were admitted for syncope or siezures until you have seen by Primary MD or a Neurologist and advised to do so again. [place] Controlled Substance Reporting System database was reviewed. Do not drive, operate heavy machinery, perform activities at heights, swim, participate in water activities or provide baby-sitting services while on medications for pain, sleep and mood until your outpatient physician has reevaluated you and advised to do so again.  You are strongly recommended to comply with the dose, frequency and duration of prescribed medications. Activity: As tolerated with Full fall precautions use walker/cane & assistance as needed Avoid using any recreational substances like cigarette, tobacco, alcohol, or non-prescribed drug. If you experience worsening of your admission symptoms, develop shortness of breath, life  threatening emergency, suicidal or homicidal thoughts you must seek medical attention immediately by calling 911 or calling your MD immediately  if symptoms less severe. You must read complete instructions/literature along with all the possible adverse reactions/side effects for all the medicines you take and that have been prescribed to you. Take any new medicine only after you have completely understood and accepted all the possible adverse reactions/side effects.  Wear Seat belts while driving. You were cared for by a hospitalist during your hospital stay. If you have any questions about your discharge medications or the care you received while you were in the hospital after you are discharged, you can call the unit and ask to speak with the hospitalist or the covering physician. Once you are discharged, your primary care physician will handle any further medical issues. Please note that NO REFILLS for any discharge medications will be authorized once you are discharged, as it is imperative that you return to your primary care physician (or establish a relationship with a primary care physician if you do not have one).   Discharge wound care:   Complete by: As directed    Increase activity slowly   Complete by: As directed        Discharge Medications:   Allergies as of 12/27/2021   No Known Allergies      Medication List     STOP taking these medications    atorvastatin 20 MG tablet Commonly known as: LIPITOR   doxycycline 100 MG tablet Commonly known as: VIBRA-TABS   ibuprofen 800 MG tablet Commonly known as: ADVIL   oxyCODONE-acetaminophen 5-325 MG tablet Commonly known as: Percocet Replaced by: oxyCODONE-acetaminophen 10-325 MG tablet       TAKE these medications    cyclobenzaprine 10 MG tablet Commonly known as: FLEXERIL Take 1 tablet (10 mg total) by mouth at bedtime. What changed:  when to take this reasons to take this   hydrochlorothiazide 25 MG  tablet Commonly known as: HYDRODIURIL TAKE 1 TABLET BY MOUTH EVERY DAY What changed: when to take this   metFORMIN 500 MG tablet Commonly known as: GLUCOPHAGE Take 1 tablet (500 mg total) by mouth daily with breakfast.   mometasone 50 MCG/ACT nasal spray Commonly known as: NASONEX Place 2 sprays into the nose daily as needed (congestion).   oxyCODONE-acetaminophen 10-325 MG tablet Commonly known as: Percocet Take 1 tablet by mouth every 6 (six) hours as needed for up to 5 days for pain. Replaces: oxyCODONE-acetaminophen 5-325 MG tablet   pantoprazole 40 MG tablet Commonly known as: Protonix Take 1 tablet (40 mg total) by mouth daily.   senna-docusate  8.6-50 MG tablet Commonly known as: Senokot-S Take 1 tablet by mouth daily.               Discharge Care Instructions  (From admission, onward)           Start     Ordered   12/27/21 0000  Discharge wound care:        12/27/21 1323             The results of significant diagnostics from this hospitalization (including imaging, microbiology, ancillary and laboratory) are listed below for reference.    Procedures and Diagnostic Studies:   DG Foot Complete Left  Result Date: 12/25/2021 CLINICAL DATA:  Postop. EXAM: LEFT FOOT - COMPLETE 3+ VIEW COMPARISON:  Left foot CT dated 11/12/2021. FINDINGS: Evaluation is limited due to overlying boot. No definite acute fracture or dislocation. Fixation screws extending from the talar dome into the posterior calcaneus as well as fixation plate and screws of the anterior talus and navicular. There is fixation of the lateral calcaneus and cuboid. The screws are intact. Multiple cutaneous clips noted. IMPRESSION: Postsurgical changes of the left foot. No definite acute fracture. Electronically Signed   By: Anner Crete M.D.   On: 12/25/2021 20:26     Labs:   Basic Metabolic Panel: Recent Labs  Lab 12/25/21 0154 12/26/21 0601  NA 134* 136  K 3.5 3.4*  CL 103 104   CO2 23 24  GLUCOSE 129* 108*  BUN 16 11  CREATININE 1.36* 0.88  CALCIUM 8.9 8.6*   GFR Estimated Creatinine Clearance: 108 mL/min (by C-G formula based on SCr of 0.88 mg/dL). Liver Function Tests: No results for input(s): "AST", "ALT", "ALKPHOS", "BILITOT", "PROT", "ALBUMIN" in the last 168 hours. No results for input(s): "LIPASE", "AMYLASE" in the last 168 hours. No results for input(s): "AMMONIA" in the last 168 hours. Coagulation profile No results for input(s): "INR", "PROTIME" in the last 168 hours.  CBC: Recent Labs  Lab 12/25/21 0154 12/26/21 0601  WBC 8.0 7.0  NEUTROABS 6.5  --   HGB 12.7* 12.2*  HCT 38.3* 37.0*  MCV 85.7 86.2  PLT 190 195   Cardiac Enzymes: No results for input(s): "CKTOTAL", "CKMB", "CKMBINDEX", "TROPONINI" in the last 168 hours. BNP: Invalid input(s): "POCBNP" CBG: Recent Labs  Lab 12/26/21 1146 12/26/21 1650 12/26/21 2120 12/27/21 0747 12/27/21 1132  GLUCAP 99 106* 112* 101* 91   D-Dimer No results for input(s): "DDIMER" in the last 72 hours. Hgb A1c Recent Labs    12/25/21 0154  HGBA1C 6.1*   Lipid Profile No results for input(s): "CHOL", "HDL", "LDLCALC", "TRIG", "CHOLHDL", "LDLDIRECT" in the last 72 hours. Thyroid function studies No results for input(s): "TSH", "T4TOTAL", "T3FREE", "THYROIDAB" in the last 72 hours.  Invalid input(s): "FREET3" Anemia work up No results for input(s): "VITAMINB12", "FOLATE", "FERRITIN", "TIBC", "IRON", "RETICCTPCT" in the last 72 hours. Microbiology No results found for this or any previous visit (from the past 240 hour(s)).  Time coordinating discharge: 35 minutes  Signed: Vernadette Stutsman  [facility] 12/27/2021, 1:23 PM

## 2021-12-28 NOTE — Telephone Encounter (Signed)
Spoke with patient and he is following instructions given by physician, will call back if further problems.

## 2021-12-28 NOTE — Telephone Encounter (Signed)
Please advise 

## 2022-01-01 ENCOUNTER — Encounter: Payer: BC Managed Care – PPO | Admitting: Podiatry

## 2022-01-02 ENCOUNTER — Ambulatory Visit (INDEPENDENT_AMBULATORY_CARE_PROVIDER_SITE_OTHER): Payer: BC Managed Care – PPO

## 2022-01-02 ENCOUNTER — Ambulatory Visit (INDEPENDENT_AMBULATORY_CARE_PROVIDER_SITE_OTHER): Payer: BC Managed Care – PPO | Admitting: Podiatry

## 2022-01-02 ENCOUNTER — Telehealth: Payer: Self-pay | Admitting: *Deleted

## 2022-01-02 ENCOUNTER — Telehealth: Payer: Self-pay | Admitting: Podiatry

## 2022-01-02 DIAGNOSIS — Z9889 Other specified postprocedural states: Secondary | ICD-10-CM

## 2022-01-02 DIAGNOSIS — M25872 Other specified joint disorders, left ankle and foot: Secondary | ICD-10-CM | POA: Diagnosis not present

## 2022-01-02 DIAGNOSIS — Q666 Other congenital valgus deformities of feet: Secondary | ICD-10-CM

## 2022-01-02 MED ORDER — OXYCODONE-ACETAMINOPHEN 10-325 MG PO TABS: 1.0000 | ORAL_TABLET | ORAL | 0 refills | Status: DC | PRN

## 2022-01-02 MED ORDER — DOXYCYCLINE HYCLATE 100 MG PO TABS: 100.0000 mg | ORAL_TABLET | Freq: Two times a day (BID) | ORAL | 0 refills | Status: DC

## 2022-01-02 NOTE — Telephone Encounter (Signed)
Pt states that the oxycodone  medication was not sent to pharmacy.   Please advise.  [id number] [address] Phone:  [phone]

## 2022-01-02 NOTE — Progress Notes (Signed)
Subjective:  Patient ID: Troy Arellano, male    DOB: [birth year],  MRN: [id number]  Chief Complaint  Patient presents with   Routine Post Op    POV #1 DOS 12/24/2021 LT KOUTS, OSTEOTOMY W/GASTROCNEMIUS RECESSION & TRIPLE ARTHRODESIS W/FIXATION AS NEEDED & ALLOGRAFT    DOS: 12/24/2021 Procedure: Left triple arthrodesis with graft and gastrocnemius recession  56 y.o. male returns for post-op check.  Patient states is doing okay.  Pain is controlled.  He has been taking his pain medication.  His bandages clean dry and intact.  No other acute complaints  Review of Systems: Negative except as noted in the HPI. Denies N/V/F/Ch.  Past Medical History:  Diagnosis Date   Arthritis    Back   Benign localized prostatic hyperplasia with lower urinary tract symptoms (LUTS)    urologist--- dr Cain Sieve   Bilateral recurrent inguinal hernia    GERD (gastroesophageal reflux disease)    History of adenomatous polyp of colon    History of COVID-19 05/2020   per pt mild symptoms that resovled   Hyperlipidemia    Hypertension    followed by pcp   Type 2 diabetes mellitus ([place])    followed by pcp   (07-17-2021  per pt does not take check blood sugar )    Current Outpatient Medications:    doxycycline (VIBRA-TABS) 100 MG tablet, Take 1 tablet (100 mg total) by mouth 2 (two) times daily., Disp: 28 tablet, Rfl: 0   oxyCODONE-acetaminophen (PERCOCET) 10-325 MG tablet, Take 1 tablet by mouth every 4 (four) hours as needed for pain., Disp: 30 tablet, Rfl: 0   cyclobenzaprine (FLEXERIL) 10 MG tablet, Take 1 tablet (10 mg total) by mouth at bedtime. (Patient taking differently: Take 10 mg by mouth at bedtime as needed for muscle spasms.), Disp: 10 tablet, Rfl: 0   hydrochlorothiazide (HYDRODIURIL) 25 MG tablet, TAKE 1 TABLET BY MOUTH EVERY DAY (Patient taking differently: Take 25 mg by mouth every morning.), Disp: 90 tablet, Rfl: 0   metFORMIN (GLUCOPHAGE) 500 MG tablet, Take 1 tablet (500 mg total) by mouth  daily with breakfast. (Patient not taking: Reported on 12/25/2021), Disp: 90 tablet, Rfl: 3   mometasone (NASONEX) 50 MCG/ACT nasal spray, Place 2 sprays into the nose daily as needed (congestion)., Disp: , Rfl:    pantoprazole (PROTONIX) 40 MG tablet, Take 1 tablet (40 mg total) by mouth daily. (Patient not taking: Reported on 12/25/2021), Disp: 90 tablet, Rfl: 0   senna-docusate (SENOKOT-S) 8.6-50 MG tablet, Take 1 tablet by mouth daily., Disp: 30 tablet, Rfl: 0  Social History   Tobacco Use  Smoking Status Never  Smokeless Tobacco Never    No Known Allergies Objective:  There were no vitals filed for this visit. There is no height or weight on file to calculate BMI. Constitutional Well developed. Well nourished.  Vascular Foot warm and well perfused. Capillary refill normal to all digits.   Neurologic Normal speech. Oriented to person, place, and time. Epicritic sensation to light touch grossly present bilaterally.  Dermatologic Skin healing well without signs of infection. Skin edges well coapted without signs of infection.  Mild erythema noted  Orthopedic: Tenderness to palpation noted about the surgical site.   Radiographs: 3 views of skeletally mature adult left foot: Hardware is intact no signs of backing out or loosening noted.  Reduction of arthrodesis sites noted.  Consolidation beginning to start. Assessment:   1. Impingement syndrome of left ankle   2. Pes planovalgus   3. Status  post foot surgery    Plan:  Patient was evaluated and treated and all questions answered.  S/p foot surgery left -Progressing as expected post-operatively. -XR: See above -WB Status: Nonweightbearing in left lower extremity and crutches.  I encouraged him to get a knee scooter -Sutures: Intact.  No signs of Deis is noted no complication noted. -Medications: Percocet and doxycycline for pain and soft tissue prophylaxis -Foot redressed.  No follow-ups on file.

## 2022-01-02 NOTE — Telephone Encounter (Signed)
Patient called back pharmacy is saying that they did not get refill for oxycodone,  he would like you to verify with them that they did get it, its showing in system that it went.

## 2022-01-02 NOTE — Telephone Encounter (Signed)
Patient is calling for status of a pain medicine that was not sent to pharmacy,(oxycodone). Nothing mentioned in epic notes. Please advise.

## 2022-01-02 NOTE — Telephone Encounter (Signed)
Please advise 

## 2022-01-03 NOTE — Telephone Encounter (Signed)
Spoke with patient and he did receive medication on 01/02/22.

## 2022-01-03 NOTE — Telephone Encounter (Signed)
Patient received medication.

## 2022-01-04 ENCOUNTER — Encounter: Payer: Self-pay | Admitting: Podiatry

## 2022-01-08 ENCOUNTER — Telehealth: Payer: Self-pay | Admitting: *Deleted

## 2022-01-08 NOTE — Telephone Encounter (Signed)
Patient is requesting a medication refill of his pain medicine(Percocet,10-325 mg).

## 2022-01-09 MED ORDER — OXYCODONE-ACETAMINOPHEN 10-325 MG PO TABS: 1.0000 | ORAL_TABLET | ORAL | 0 refills | Status: DC | PRN

## 2022-01-09 NOTE — Telephone Encounter (Signed)
Patient is calling because he is in a lot of pain exp. @ night. Spoke with provider and a prescription has been sent to pharmacy on file 01/09/22, explained this to patient,verbalized understanding.

## 2022-01-15 ENCOUNTER — Encounter: Payer: BC Managed Care – PPO | Admitting: Podiatry

## 2022-01-16 ENCOUNTER — Ambulatory Visit (INDEPENDENT_AMBULATORY_CARE_PROVIDER_SITE_OTHER): Payer: BC Managed Care – PPO | Admitting: Podiatry

## 2022-01-16 DIAGNOSIS — Q666 Other congenital valgus deformities of feet: Secondary | ICD-10-CM

## 2022-01-16 DIAGNOSIS — M25872 Other specified joint disorders, left ankle and foot: Secondary | ICD-10-CM

## 2022-01-16 DIAGNOSIS — Z9889 Other specified postprocedural states: Secondary | ICD-10-CM

## 2022-01-16 MED ORDER — OXYCODONE-ACETAMINOPHEN 10-325 MG PO TABS: 1.0000 | ORAL_TABLET | ORAL | 0 refills | Status: DC | PRN

## 2022-01-16 NOTE — Progress Notes (Signed)
Subjective:  Patient ID: Troy Arellano, male    DOB: 01/30/1966,  MRN: [id number]  Chief Complaint  Patient presents with   Routine Post Op    POV #2 DOS 12/24/2021 LT KOUTS, OSTEOTOMY W/GASTROCNEMIUS RECESSION & TRIPLE ARTHRODESIS W/FIXATION AS NEEDED & ALLOGRAFT    DOS: 12/24/2021 Procedure: Left triple arthrodesis with graft and gastrocnemius recession  56 y.o. male returns for post-op check.  Patient states is doing okay.  Pain is controlled.  He has been taking his pain medication.  His bandages clean dry and intact.  No other acute complaints  Review of Systems: Negative except as noted in the HPI. Denies N/V/F/Ch.  Past Medical History:  Diagnosis Date   Arthritis    Back   Benign localized prostatic hyperplasia with lower urinary tract symptoms (LUTS)    urologist--- dr Cain Sieve   Bilateral recurrent inguinal hernia    GERD (gastroesophageal reflux disease)    History of adenomatous polyp of colon    History of COVID-19 05/2020   per pt mild symptoms that resovled   Hyperlipidemia    Hypertension    followed by pcp   Type 2 diabetes mellitus ([place])    followed by pcp   (07-17-2021  per pt does not take check blood sugar )    Current Outpatient Medications:    oxyCODONE-acetaminophen (PERCOCET) 10-325 MG tablet, Take 1 tablet by mouth every 4 (four) hours as needed for pain., Disp: 30 tablet, Rfl: 0   cyclobenzaprine (FLEXERIL) 10 MG tablet, Take 1 tablet (10 mg total) by mouth at bedtime. (Patient taking differently: Take 10 mg by mouth at bedtime as needed for muscle spasms.), Disp: 10 tablet, Rfl: 0   doxycycline (VIBRA-TABS) 100 MG tablet, Take 1 tablet (100 mg total) by mouth 2 (two) times daily., Disp: 28 tablet, Rfl: 0   hydrochlorothiazide (HYDRODIURIL) 25 MG tablet, TAKE 1 TABLET BY MOUTH EVERY DAY (Patient taking differently: Take 25 mg by mouth every morning.), Disp: 90 tablet, Rfl: 0   metFORMIN (GLUCOPHAGE) 500 MG tablet, Take 1 tablet (500 mg total) by mouth  daily with breakfast. (Patient not taking: Reported on 12/25/2021), Disp: 90 tablet, Rfl: 3   mometasone (NASONEX) 50 MCG/ACT nasal spray, Place 2 sprays into the nose daily as needed (congestion)., Disp: , Rfl:    oxyCODONE-acetaminophen (PERCOCET) 10-325 MG tablet, Take 1 tablet by mouth every 4 (four) hours as needed for pain., Disp: 30 tablet, Rfl: 0   oxyCODONE-acetaminophen (PERCOCET) 10-325 MG tablet, Take 1 tablet by mouth every 4 (four) hours as needed for pain., Disp: 30 tablet, Rfl: 0   pantoprazole (PROTONIX) 40 MG tablet, Take 1 tablet (40 mg total) by mouth daily. (Patient not taking: Reported on 12/25/2021), Disp: 90 tablet, Rfl: 0   senna-docusate (SENOKOT-S) 8.6-50 MG tablet, Take 1 tablet by mouth daily., Disp: 30 tablet, Rfl: 0  Social History   Tobacco Use  Smoking Status Never  Smokeless Tobacco Never    No Known Allergies Objective:  There were no vitals filed for this visit. There is no height or weight on file to calculate BMI. Constitutional Well developed. Well nourished.  Vascular Foot warm and well perfused. Capillary refill normal to all digits.   Neurologic Normal speech. Oriented to person, place, and time. Epicritic sensation to light touch grossly present bilaterally.  Dermatologic Skin healing well without signs of infection. Skin edges well coapted without signs of infection.  Mild erythema noted  Orthopedic: Tenderness to palpation noted about the surgical site.  Radiographs: 3 views of skeletally mature adult left foot: Hardware is intact no signs of backing out or loosening noted.  Reduction of arthrodesis sites noted.  Consolidation beginning to start. Assessment:   1. Impingement syndrome of left ankle   2. Pes planovalgus   3. Status post foot surgery     Plan:  Patient was evaluated and treated and all questions answered.  S/p foot surgery left -Progressing as expected post-operatively. -XR: See above -WB Status: Nonweightbearing in  left lower extremity and crutches.  I encouraged him to get a knee scooter -Sutures: Removed no signs of Deis is noted no complication noted. -Medications: Percocet -Foot redressed.  No follow-ups on file.

## 2022-01-27 ENCOUNTER — Other Ambulatory Visit: Payer: Self-pay | Admitting: Podiatry

## 2022-01-31 ENCOUNTER — Telehealth: Payer: Self-pay | Admitting: *Deleted

## 2022-01-31 MED ORDER — OXYCODONE-ACETAMINOPHEN 10-325 MG PO TABS: 1.0000 | ORAL_TABLET | Freq: Three times a day (TID) | ORAL | 0 refills | Status: AC | PRN

## 2022-01-31 MED ORDER — IBUPROFEN 800 MG PO TABS: 800.0000 mg | ORAL_TABLET | Freq: Four times a day (QID) | ORAL | 1 refills | Status: DC | PRN

## 2022-01-31 NOTE — Telephone Encounter (Signed)
Patient is requesting a refill of the oxycodone-ace,10-325 mg,ibuprofen, please advise.

## 2022-01-31 NOTE — Telephone Encounter (Signed)
Please send to pharmacy on file

## 2022-02-15 ENCOUNTER — Ambulatory Visit (INDEPENDENT_AMBULATORY_CARE_PROVIDER_SITE_OTHER): Payer: BC Managed Care – PPO

## 2022-02-15 ENCOUNTER — Ambulatory Visit (INDEPENDENT_AMBULATORY_CARE_PROVIDER_SITE_OTHER): Payer: BC Managed Care – PPO | Admitting: Podiatry

## 2022-02-15 DIAGNOSIS — Q666 Other congenital valgus deformities of feet: Secondary | ICD-10-CM

## 2022-02-15 DIAGNOSIS — Z9889 Other specified postprocedural states: Secondary | ICD-10-CM

## 2022-02-15 DIAGNOSIS — M25872 Other specified joint disorders, left ankle and foot: Secondary | ICD-10-CM

## 2022-02-15 NOTE — Progress Notes (Signed)
Subjective:  Patient ID: Troy Arellano, male    DOB: [birth year],  MRN: [id number]  Chief Complaint  Patient presents with   Routine Post Op    Pt stated that he is doing better is ready to come out the boot. He stated that he still gets some swelling but no other concerns at this time     DOS: 12/24/2021 Procedure: Left triple arthrodesis with graft and gastrocnemius recession  56 y.o. male returns for post-op check.  Patient states is doing okay.  No pain.  He is mostly taking ibuprofen.  He is putting some weight down with the boot on.  He denies any other acute complaints  Review of Systems: Negative except as noted in the HPI. Denies N/V/F/Ch.  Past Medical History:  Diagnosis Date   Arthritis    Back   Benign localized prostatic hyperplasia with lower urinary tract symptoms (LUTS)    urologist--- dr Troy Arellano   Bilateral recurrent inguinal hernia    GERD (gastroesophageal reflux disease)    History of adenomatous polyp of colon    History of COVID-19 05/2020   per pt mild symptoms that resovled   Hyperlipidemia    Hypertension    followed by pcp   Type 2 diabetes mellitus ([place])    followed by pcp   (07-17-2021  per pt does not take check blood sugar )    Current Outpatient Medications:    cyclobenzaprine (FLEXERIL) 10 MG tablet, Take 1 tablet (10 mg total) by mouth at bedtime. (Patient taking differently: Take 10 mg by mouth at bedtime as needed for muscle spasms.), Disp: 10 tablet, Rfl: 0   doxycycline (VIBRA-TABS) 100 MG tablet, Take 1 tablet (100 mg total) by mouth 2 (two) times daily., Disp: 28 tablet, Rfl: 0   hydrochlorothiazide (HYDRODIURIL) 25 MG tablet, TAKE 1 TABLET BY MOUTH EVERY DAY (Patient taking differently: Take 25 mg by mouth every morning.), Disp: 90 tablet, Rfl: 0   ibuprofen (ADVIL) 800 MG tablet, Take 1 tablet (800 mg total) by mouth every 6 (six) hours as needed., Disp: 60 tablet, Rfl: 1   metFORMIN (GLUCOPHAGE) 500 MG tablet, Take 1 tablet (500 mg total)  by mouth daily with breakfast. (Patient not taking: Reported on 12/25/2021), Disp: 90 tablet, Rfl: 3   mometasone (NASONEX) 50 MCG/ACT nasal spray, Place 2 sprays into the nose daily as needed (congestion)., Disp: , Rfl:    oxyCODONE-acetaminophen (PERCOCET) 10-325 MG tablet, Take 1 tablet by mouth every 4 (four) hours as needed for pain., Disp: 30 tablet, Rfl: 0   oxyCODONE-acetaminophen (PERCOCET) 10-325 MG tablet, Take 1 tablet by mouth every 4 (four) hours as needed for pain., Disp: 30 tablet, Rfl: 0   oxyCODONE-acetaminophen (PERCOCET) 10-325 MG tablet, Take 1 tablet by mouth every 4 (four) hours as needed for pain., Disp: 30 tablet, Rfl: 0   pantoprazole (PROTONIX) 40 MG tablet, Take 1 tablet (40 mg total) by mouth daily. (Patient not taking: Reported on 12/25/2021), Disp: 90 tablet, Rfl: 0  Social History   Tobacco Use  Smoking Status Never  Smokeless Tobacco Never    No Known Allergies Objective:  There were no vitals filed for this visit. There is no height or weight on file to calculate BMI. Constitutional Well developed. Well nourished.  Vascular Foot warm and well perfused. Capillary refill normal to all digits.   Neurologic Normal speech. Oriented to person, place, and time. Epicritic sensation to light touch grossly present bilaterally.  Dermatologic Skin healing well without signs of  infection.  Good reduction of deformity noted.  No pain at the incision site.  No pain at any surgical site.  Orthopedic: No signs or tenderness to palpation noted about the surgical site.   Radiographs: 3 views of skeletally mature adult left foot: Hardware is intact no signs of backing out or loosening noted.  Reduction of arthrodesis sites noted.  Some consolidation noted. Assessment:   1. Impingement syndrome of left ankle   2. Pes planovalgus   3. Status post foot surgery     Plan:  Patient was evaluated and treated and all questions answered.  S/p foot surgery left -Progressing  as expected post-operatively. -XR: See above -WB Status: Weightbearing as tolerated in cam boot followed by transition into regular shoes in 2 weeks.  He can either return to work in 2 weeks he is able to do so -Sutures: Removed no signs of Deis is noted no complication noted. -Medications: None none -Patient is Troy Arellano it really well.  He would like to hold off for physical therapy.  He is able to already start putting weight down with a cam boot on.  No follow-ups on file.

## 2022-02-25 ENCOUNTER — Encounter: Payer: Self-pay | Admitting: Podiatry

## 2022-03-14 ENCOUNTER — Other Ambulatory Visit: Payer: Self-pay | Admitting: Physician Assistant

## 2022-03-20 ENCOUNTER — Ambulatory Visit: Payer: BC Managed Care – PPO | Admitting: Podiatry

## 2022-03-20 ENCOUNTER — Ambulatory Visit (INDEPENDENT_AMBULATORY_CARE_PROVIDER_SITE_OTHER): Payer: BC Managed Care – PPO

## 2022-03-20 DIAGNOSIS — M25872 Other specified joint disorders, left ankle and foot: Secondary | ICD-10-CM

## 2022-03-20 DIAGNOSIS — Q666 Other congenital valgus deformities of feet: Secondary | ICD-10-CM

## 2022-03-20 DIAGNOSIS — Z9889 Other specified postprocedural states: Secondary | ICD-10-CM

## 2022-03-20 NOTE — Progress Notes (Signed)
Subjective:  Patient ID: Troy Arellano, male    DOB: 1965/11/05,  MRN: [id number]  Chief Complaint  Patient presents with   Routine Post Op    Pt stated that he is doing better is ready to come out the boot. He stated that he still gets some swelling but no other concerns at this time     DOS: 12/24/2021 Procedure: Left triple arthrodesis with graft and gastrocnemius recession  56 y.o. male returns for post-op check.  Patient states is doing okay.  No pain.  He is mostly taking ibuprofen.  He is doing well.  He is returned to regular shoes.  He feels a little bit of instability when walking and getting used to his foot.  He would like to discuss physical therapy options and orthotics  Review of Systems: Negative except as noted in the HPI. Denies N/V/F/Ch.  Past Medical History:  Diagnosis Date   Arthritis    Back   Benign localized prostatic hyperplasia with lower urinary tract symptoms (LUTS)    urologist--- dr Cain Sieve   Bilateral recurrent inguinal hernia    GERD (gastroesophageal reflux disease)    History of adenomatous polyp of colon    History of COVID-19 05/2020   per pt mild symptoms that resovled   Hyperlipidemia    Hypertension    followed by pcp   Type 2 diabetes mellitus ([place])    followed by pcp   (07-17-2021  per pt does not take check blood sugar )    Current Outpatient Medications:    cyclobenzaprine (FLEXERIL) 10 MG tablet, Take 1 tablet (10 mg total) by mouth at bedtime. (Patient taking differently: Take 10 mg by mouth at bedtime as needed for muscle spasms.), Disp: 10 tablet, Rfl: 0   doxycycline (VIBRA-TABS) 100 MG tablet, Take 1 tablet (100 mg total) by mouth 2 (two) times daily., Disp: 28 tablet, Rfl: 0   hydrochlorothiazide (HYDRODIURIL) 25 MG tablet, TAKE 1 TABLET BY MOUTH EVERY DAY (Patient taking differently: Take 25 mg by mouth every morning.), Disp: 90 tablet, Rfl: 0   ibuprofen (ADVIL) 800 MG tablet, Take 1 tablet (800 mg total) by mouth every 6 (six)  hours as needed., Disp: 60 tablet, Rfl: 1   metFORMIN (GLUCOPHAGE) 500 MG tablet, Take 1 tablet (500 mg total) by mouth daily with breakfast. (Patient not taking: Reported on 12/25/2021), Disp: 90 tablet, Rfl: 3   mometasone (NASONEX) 50 MCG/ACT nasal spray, Place 2 sprays into the nose daily as needed (congestion)., Disp: , Rfl:    oxyCODONE-acetaminophen (PERCOCET) 10-325 MG tablet, Take 1 tablet by mouth every 4 (four) hours as needed for pain., Disp: 30 tablet, Rfl: 0   oxyCODONE-acetaminophen (PERCOCET) 10-325 MG tablet, Take 1 tablet by mouth every 4 (four) hours as needed for pain., Disp: 30 tablet, Rfl: 0   oxyCODONE-acetaminophen (PERCOCET) 10-325 MG tablet, Take 1 tablet by mouth every 4 (four) hours as needed for pain., Disp: 30 tablet, Rfl: 0   pantoprazole (PROTONIX) 40 MG tablet, Take 1 tablet (40 mg total) by mouth daily. (Patient not taking: Reported on 12/25/2021), Disp: 90 tablet, Rfl: 0  Social History   Tobacco Use  Smoking Status Never  Smokeless Tobacco Never    No Known Allergies Objective:  There were no vitals filed for this visit. There is no height or weight on file to calculate BMI. Constitutional Well developed. Well nourished.  Vascular Foot warm and well perfused. Capillary refill normal to all digits.   Neurologic Normal speech. Oriented  to person, place, and time. Epicritic sensation to light touch grossly present bilaterally.  Dermatologic Skin healing well without signs of infection.  Good reduction of deformity noted.  No pain at the incision site.  No pain at any surgical site.  Gait examination shows 80% normal gait.  No pain on palpation to any of the surgical sites  Orthopedic: No signs or tenderness to palpation noted about the surgical site.   Radiographs: 3 views of skeletally mature adult left foot: Hardware is intact no signs of backing out or loosening noted.  Reduction of arthrodesis sites noted.  Some consolidation noted. Assessment:   1.  Impingement syndrome of left ankle   2. Pes planovalgus   3. Status post foot surgery     Plan:  Patient was evaluated and treated and all questions answered.  S/p foot surgery left -Progressing as expected post-operatively. -XR: See above -WB Status: Weightbearing as tolerated in regular shoes -He is having some instability at the ankle.  I discussed Tri-Lock ankle brace with him he will take a look at it and see if will help. -I also discussed shoe gear modification orthotics as well.  He will benefit from custom orthotics to help control the hindfoot motion support the arches of his feet. -He will begin physical therapy to help him normalize his gait. -I will see him back again in approximately 8 weeks for final follow-up.  Pes planovalgus -I explained to patient the etiology of pes planovalgus and relationship with Planter fasciitis and various treatment options were discussed.  Given patient foot structure in the setting of Planter fasciitis I believe patient will benefit from custom-made orthotics to help control the hindfoot motion support the arch of the foot and take the stress away from plantar fascial.  Patient agrees with the plan like to proceed with orthotics -Patient was casted for orthotics  No follow-ups on file.

## 2022-04-24 ENCOUNTER — Other Ambulatory Visit: Payer: Self-pay | Admitting: Orthopaedic Surgery

## 2022-04-24 ENCOUNTER — Encounter: Payer: Self-pay | Admitting: Orthopaedic Surgery

## 2022-04-24 DIAGNOSIS — M960 Pseudarthrosis after fusion or arthrodesis: Secondary | ICD-10-CM

## 2022-04-24 DIAGNOSIS — Z981 Arthrodesis status: Secondary | ICD-10-CM

## 2022-04-25 ENCOUNTER — Telehealth: Payer: Self-pay | Admitting: Podiatry

## 2022-04-25 ENCOUNTER — Ambulatory Visit
Admission: RE | Admit: 2022-04-25 | Discharge: 2022-04-25 | Disposition: A | Discharge status: Home / Self Care | Payer: BC Managed Care – PPO | Source: Ambulatory Visit | Attending: Orthopaedic Surgery | Admitting: Orthopaedic Surgery

## 2022-04-25 ENCOUNTER — Encounter: Payer: Self-pay | Admitting: Orthopaedic Surgery

## 2022-04-25 DIAGNOSIS — M960 Pseudarthrosis after fusion or arthrodesis: Secondary | ICD-10-CM

## 2022-04-25 DIAGNOSIS — Z981 Arthrodesis status: Secondary | ICD-10-CM

## 2022-04-25 NOTE — Telephone Encounter (Signed)
Lmom to call back to set up appt to pick up orthotics   Balance $430.00

## 2022-05-09 ENCOUNTER — Ambulatory Visit (INDEPENDENT_AMBULATORY_CARE_PROVIDER_SITE_OTHER): Payer: BC Managed Care – PPO | Admitting: Physician Assistant

## 2022-05-09 ENCOUNTER — Encounter: Payer: Self-pay | Admitting: Physician Assistant

## 2022-05-09 VITALS — BP 130/80 | HR 61 | Temp 98.0°F | Ht 67.0 in | Wt 225.4 lb

## 2022-05-09 DIAGNOSIS — F4323 Adjustment disorder with mixed anxiety and depressed mood: Secondary | ICD-10-CM | POA: Diagnosis not present

## 2022-05-09 DIAGNOSIS — R6889 Other general symptoms and signs: Secondary | ICD-10-CM | POA: Diagnosis not present

## 2022-05-09 DIAGNOSIS — F5109 Other insomnia not due to a substance or known physiological condition: Secondary | ICD-10-CM

## 2022-05-09 LAB — POCT INFLUENZA A/B
Influenza A, POC: NEGATIVE
Influenza B, POC: NEGATIVE

## 2022-05-09 MED ORDER — HYDROXYZINE PAMOATE 25 MG PO CAPS: 25.0000 mg | ORAL_CAPSULE | Freq: Every evening | ORAL | 0 refills | Status: DC | PRN

## 2022-05-09 NOTE — Progress Notes (Signed)
Troy Arellano is a 57 y.o. male here for a new problem.  History of Present Illness:   Chief Complaint  Patient presents with   Cough    Pt c/o cough started on Saturday. Denies fever of chills.    HPI  Flu-like sx Patient is complaining of a sinus pressure with rhinorrhea starting 12/30. He reports that he was at a funeral around people with Covid. He mentions experiencing coughing and diarrhea which have resolved. His wife is coughing as well and she completed a home Covid test which came back negative. He took a Covid test at [facility] which also came back negative. Patient manages symptoms with tylenol. He denies fever, chills, changes in appetite, sore throat, and ear pressure.  Situational Anxiety and Depression Patient is requesting a referral to a mental health specialist. He reports that he had surgery completed on his left foot. He began experiencing pain and was told from a different doctor that the surgeon botched the procedure. Patient now needs another surgery to correct this. He states that the whole ordeal has taken a toll on his mental health.  Insomnia Patient is requesting medication to sleep. He reports that he has trouble sleeping due to pain. He expresses that he does not want to take pain medication because he is nervous about it.    Past Medical History:  Diagnosis Date   Arthritis    Back   Benign localized prostatic hyperplasia with lower urinary tract symptoms (LUTS)    urologist--- dr Cain Sieve   Bilateral recurrent inguinal hernia    GERD (gastroesophageal reflux disease)    History of adenomatous polyp of colon    History of COVID-19 05/2020   per pt mild symptoms that resovled   Hyperlipidemia    Hypertension    followed by pcp   Type 2 diabetes mellitus ([place])    followed by pcp   (07-17-2021  per pt does not take check blood sugar )     Social History   Tobacco Use   Smoking status: Never   Smokeless tobacco: Never  Vaping Use   Vaping Use: Never used   Substance Use Topics   Alcohol use: Yes    Alcohol/week: 2.0 - 3.0 standard drinks of alcohol    Types: 2 - 3 Standard drinks or equivalent per week    Comment: occasionally   Drug use: No    Past Surgical History:  Procedure Laterality Date   COLONOSCOPY  12/28/2020   by dr h. danis   INGUINAL HERNIA REPAIR Bilateral    right 1987 and left 1988   INGUINAL HERNIA REPAIR Bilateral 07/20/2021   Procedure: LAPAROSCOPIC RECURRENT BILATERAL INGUINAL HERNIA REPAIR WITH MESH;  Surgeon: Stechschulte, Nickola Major, MD;  Location: [facility];  Service: General;  Laterality: Bilateral;    Family History  Problem Relation Age of Onset   Hypertension Mother    Hypertension Father    Heart failure Father    Lymphoma Sister    Colon cancer Neg Hx    Esophageal cancer Neg Hx    Rectal cancer Neg Hx    Stomach cancer Neg Hx    Colon polyps Neg Hx     No Known Allergies  Current Medications:   Current Outpatient Medications:    cyclobenzaprine (FLEXERIL) 10 MG tablet, Take 1 tablet (10 mg total) by mouth at bedtime. (Patient taking differently: Take 10 mg by mouth at bedtime as needed for muscle spasms.), Disp: 10 tablet, Rfl: 0  hydrochlorothiazide (HYDRODIURIL) 25 MG tablet, TAKE 1 TABLET BY MOUTH EVERY DAY (Patient taking differently: Take 25 mg by mouth every morning.), Disp: 90 tablet, Rfl: 0   metFORMIN (GLUCOPHAGE) 500 MG tablet, Take 1 tablet (500 mg total) by mouth daily with breakfast., Disp: 90 tablet, Rfl: 3   mometasone (NASONEX) 50 MCG/ACT nasal spray, Place 2 sprays into the nose daily as needed (congestion)., Disp: , Rfl:    oxyCODONE-acetaminophen (PERCOCET) 10-325 MG tablet, Take 1 tablet by mouth every 4 (four) hours as needed for pain., Disp: 30 tablet, Rfl: 0   Review of Systems:   Review of Systems  Constitutional:  Negative for chills and fever.  HENT:  Negative for ear pain and sore throat.        (+) rhinorrhea  Respiratory:  Negative for cough.    Gastrointestinal:  Negative for diarrhea.  Psychiatric/Behavioral:  The patient has insomnia.     Vitals:   Vitals:   05/09/22 1124  BP: 130/80  Pulse: 61  Temp: 98 F (36.7 C)  TempSrc: Temporal  SpO2: 98%  Weight: 225 lb 6.1 oz (102.2 kg)  Height: '5\' 7"'$  (1.702 m)     Body mass index is 35.3 kg/m.  Physical Exam:   Physical Exam Constitutional:      General: He is not in acute distress.    Appearance: Normal appearance. He is not ill-appearing.  HENT:     Head: Normocephalic and atraumatic.     Right Ear: External ear normal.     Left Ear: External ear normal.  Eyes:     Extraocular Movements: Extraocular movements intact.     Pupils: Pupils are equal, round, and reactive to light.  Cardiovascular:     Rate and Rhythm: Normal rate and regular rhythm.     Heart sounds: Normal heart sounds. No murmur heard.    No gallop.  Pulmonary:     Effort: Pulmonary effort is normal. No respiratory distress.     Breath sounds: Normal breath sounds. No wheezing or rales.  Skin:    General: Skin is warm and dry.  Neurological:     Mental Status: He is alert and oriented to person, place, and time.  Psychiatric:        Judgment: Judgment normal.    Results for orders placed or performed in visit on 05/09/22  POCT Influenza A/B  Result Value Ref Range   Influenza A, POC Negative Negative   Influenza B, POC Negative Negative  '  Assessment and Plan:   Flu-like symptoms Flu test negative. Suspect viral URI Symptoms mostly resolved Push fluids and rest Follow-up if new/worsening sx  Situational insomnia Uncontrolled Trial hydroxyzine 25 mg nightly prn Follow-up if new/worsening  Situational mixed anxiety and depressive disorder Uncontrolled Patient requesting therapist referral -- I have placed this Denies SI/HI I discussed with patient that if they develop any SI, to tell someone immediately and seek medical attention.  I,Verona Buck,acting as a Education administrator for  [employer], PA.,have documented all relevant documentation on the behalf of [name], PA,as directed by  [name], PA while in the presence of [name], [place].  I, [name], [place], have reviewed all documentation for this visit. The documentation on 05/09/22 for the exam, diagnosis, procedures, and orders are all accurate and complete.  [name], PA-C

## 2022-05-09 NOTE — Patient Instructions (Signed)
It was great to see you!  you have a viral upper respiratory infection. Antibiotics are not needed for this.  Viral infections usually take 7-10 days to resolve.  The cough can last a few weeks to go away.  Push fluids and get plenty of rest. Please return if you are not improving as expected, or if you have high fevers (>101.5) or difficulty swallowing or worsening productive cough.  I will place referral to therapist at our office -- someone will call you for this appointment  I have sent in hydroxyzine 25 mg for you to take for sleep - let me know if this does not work for you  Call clinic with questions.  I hope you start feeling better soon!

## 2022-05-13 NOTE — Telephone Encounter (Signed)
Spoke with patient this afternoon he said he is not picking up the orthotics because his surgery was not done right and he had to have a 2nd surgery with [facility] to have it corrected.

## 2022-05-14 ENCOUNTER — Other Ambulatory Visit: Payer: Self-pay | Admitting: Orthopaedic Surgery

## 2022-05-27 ENCOUNTER — Other Ambulatory Visit: Payer: Self-pay

## 2022-05-27 ENCOUNTER — Encounter (HOSPITAL_COMMUNITY): Payer: Self-pay | Admitting: Orthopaedic Surgery

## 2022-05-27 NOTE — Progress Notes (Signed)
PCP - [name] PA Cardiologist - Denies  PPM/ICD - Denies  Chest x-ray - N/A EKG - DOS Stress Test - Denies ECHO - Denies Cardiac Cath - Denies  Sleep Study - Denies  Patient does not check blood sugars. Takes scheduled Metformin.  Blood Thinner Instructions: N/A Aspirin Instructions: N/A  ERAS Protcol - Yes PRE-SURGERY Ensure or G2- N/A  COVID TEST- N/A   Anesthesia review: No  Patient denies shortness of breath, fever, cough and chest pain at PAT appointment   All instructions explained to the patient, with a verbal understanding of the material. Patient agrees to go over the instructions while at home for a better understanding. Patient also instructed to self quarantine after being tested for COVID-19. The opportunity to ask questions was provided.    Surgical Instructions    Your procedure is scheduled on Tuesday, May 28, 2022 at 7:30 AM.  Report to [facility] Main Entrance "A" at 5:30 A.M., then check in with the Admitting office.  Call this number if you have problems the morning of surgery:  [phone]   If you have any questions prior to your surgery date call [phone]: Open Monday-Friday 8am-4pm  *If you experience any cold or flu symptoms such as cough, fever, chills, shortness of breath, etc. between now and your scheduled surgery, please notify us.*    Remember:  Do not eat after midnight the night before your surgery  You may drink clear liquids until 4:30 AM the morning of your surgery.   Clear liquids allowed are: Water, Non-Citrus Juices (without pulp), Carbonated Beverages, Clear Tea, Black Coffee Only (NO MILK, CREAM OR POWDERED CREAMER of any kind), and Gatorade.    Take these medicines the morning of surgery with A SIP OF WATER:  traMADol (ULTRAM) - if needed  As of today, STOP taking any Aspirin (unless otherwise instructed by your surgeon) Aleve, Naproxen, Ibuprofen, Motrin, Advil, Goody's, BC's, all herbal medications,  fish oil, and all vitamins.  WHAT DO I DO ABOUT MY DIABETES MEDICATION?   Do not take metFORMIN (GLUCOPHAGE)  the morning of surgery.   HOW TO MANAGE YOUR DIABETES BEFORE AND AFTER SURGERY  Why is it important to control my blood sugar before and after surgery? Improving blood sugar levels before and after surgery helps healing and can limit problems. A way of improving blood sugar control is eating a healthy diet by:  Eating less sugar and carbohydrates  Increasing activity/exercise  Talking with your doctor about reaching your blood sugar goals High blood sugars (greater than 180 mg/dL) can raise your risk of infections and slow your recovery, so you will need to focus on controlling your diabetes during the weeks before surgery. Make sure that the doctor who takes care of your diabetes knows about your planned surgery including the date and location.  How do I manage my blood sugar before surgery? Check your blood sugar at least 4 times a day, starting 2 days before surgery, to make sure that the level is not too high or low.  Check your blood sugar the morning of your surgery when you wake up and every 2 hours until you get to the Short Stay unit.  If your blood sugar is less than 70 mg/dL, you will need to treat for low blood sugar: Do not take insulin. Treat a low blood sugar (less than 70 mg/dL) with  cup of clear juice (cranberry or apple), 4 glucose tablets, OR glucose gel. Recheck blood sugar in  15 minutes after treatment (to make sure it is greater than 70 mg/dL). If your blood sugar is not greater than 70 mg/dL on recheck, call [phone] for further instructions. Report your blood sugar to the short stay nurse when you get to Short Stay.  If you are admitted to the hospital after surgery: Your blood sugar will be checked by the staff and you will probably be given insulin after surgery (instead of oral diabetes medicines) to make sure you have good blood sugar  levels. The goal for blood sugar control after surgery is 80-180 mg/dL.                      Do NOT Smoke (Tobacco/Vaping) for 24 hours prior to your procedure.  If you use a CPAP at night, you may bring your mask/headgear for your overnight stay.   Contacts, glasses, piercing's, hearing aid's, dentures or partials may not be worn into surgery, please bring cases for these belongings.    For patients admitted to the hospital, discharge time will be determined by your treatment team.   Patients discharged the day of surgery will not be allowed to drive home, and someone needs to stay with them for 24 hours.  SURGICAL WAITING ROOM VISITATION Patients having surgery or a procedure may have two support people in the waiting area. Visitors may stay in the waiting area during the procedure and switch out with other visitors if needed. Only 1 support person is allowed in the pre-op area with the patient AFTER the patient is prepped. This person cannot be switched out. Children under the age of 65 must have an adult accompany them who is not the patient. If the patient needs to stay at the hospital during part of their recovery, the visitor guidelines for inpatient rooms apply.  Please refer to the [facility] website for the visitor guidelines for Inpatients (after your surgery is over and you are in a regular room).    Special instructions:   [facility]- Preparing For Surgery   Day of Surgery: Take a shower with mild soap. Do not wear jewelry or makeup Do not wear lotions, powders, perfumes/colognes, or deodorant. Do not shave 48 hours prior to surgery.  Men may shave face and neck. Do not wear nail polish, gel polish, artificial nails, or any other type of covering on natural nails (fingers and toes) If you have artificial nails or gel coating that need to be removed by a nail salon, please have this removed prior to surgery. Artificial nails or gel coating may interfere with  anesthesia's ability to adequately monitor your vital signs. Wear Clean/Comfortable clothing the morning of surgery Do not bring valuables to the hospital.  [facility] is not responsible for any belongings or valuables. Remember to brush your teeth WITH YOUR REGULAR TOOTHPASTE.   Please read over the following fact sheets that you were given.  If you received a COVID test during your pre-op visit  it is requested that you wear a mask when out in public, stay away from anyone that may not be feeling well and notify your surgeon if you develop symptoms. If you have been in contact with anyone that has tested positive in the last 10 days please notify you surgeon.

## 2022-05-28 ENCOUNTER — Inpatient Hospital Stay (HOSPITAL_COMMUNITY)
Admission: AD | Admit: 2022-05-28 | Discharge: 2022-05-31 | DRG: 493 | Disposition: A | Discharge status: Home Health | Payer: BC Managed Care – PPO | Attending: Orthopaedic Surgery | Admitting: Orthopaedic Surgery

## 2022-05-28 ENCOUNTER — Ambulatory Visit (HOSPITAL_COMMUNITY): Payer: BC Managed Care – PPO

## 2022-05-28 ENCOUNTER — Other Ambulatory Visit: Payer: Self-pay

## 2022-05-28 ENCOUNTER — Encounter (HOSPITAL_COMMUNITY)
Admission: AD | Disposition: A | Discharge status: Home Health | Payer: Self-pay | Source: Home / Self Care | Attending: Orthopaedic Surgery

## 2022-05-28 ENCOUNTER — Ambulatory Visit (HOSPITAL_COMMUNITY): Payer: BC Managed Care – PPO | Admitting: Anesthesiology

## 2022-05-28 ENCOUNTER — Encounter (HOSPITAL_COMMUNITY): Payer: Self-pay | Admitting: Orthopaedic Surgery

## 2022-05-28 DIAGNOSIS — Y828 Other medical devices associated with adverse incidents: Secondary | ICD-10-CM | POA: Diagnosis present

## 2022-05-28 DIAGNOSIS — Z8616 Personal history of COVID-19: Secondary | ICD-10-CM

## 2022-05-28 DIAGNOSIS — Y838 Other surgical procedures as the cause of abnormal reaction of the patient, or of later complication, without mention of misadventure at the time of the procedure: Secondary | ICD-10-CM | POA: Diagnosis present

## 2022-05-28 DIAGNOSIS — E119 Type 2 diabetes mellitus without complications: Secondary | ICD-10-CM | POA: Diagnosis present

## 2022-05-28 DIAGNOSIS — M24072 Loose body in left ankle: Secondary | ICD-10-CM | POA: Diagnosis present

## 2022-05-28 DIAGNOSIS — M1990 Unspecified osteoarthritis, unspecified site: Secondary | ICD-10-CM | POA: Diagnosis present

## 2022-05-28 DIAGNOSIS — K219 Gastro-esophageal reflux disease without esophagitis: Secondary | ICD-10-CM | POA: Diagnosis present

## 2022-05-28 DIAGNOSIS — M79672 Pain in left foot: Secondary | ICD-10-CM | POA: Diagnosis present

## 2022-05-28 DIAGNOSIS — G8918 Other acute postprocedural pain: Principal | ICD-10-CM

## 2022-05-28 DIAGNOSIS — T84038A Mechanical loosening of other internal prosthetic joint, initial encounter: Principal | ICD-10-CM | POA: Diagnosis present

## 2022-05-28 DIAGNOSIS — Z86010 Personal history of colonic polyps: Secondary | ICD-10-CM

## 2022-05-28 DIAGNOSIS — Z807 Family history of other malignant neoplasms of lymphoid, hematopoietic and related tissues: Secondary | ICD-10-CM

## 2022-05-28 DIAGNOSIS — I10 Essential (primary) hypertension: Secondary | ICD-10-CM | POA: Diagnosis present

## 2022-05-28 DIAGNOSIS — Z79899 Other long term (current) drug therapy: Secondary | ICD-10-CM

## 2022-05-28 DIAGNOSIS — Z8249 Family history of ischemic heart disease and other diseases of the circulatory system: Secondary | ICD-10-CM

## 2022-05-28 DIAGNOSIS — Z7984 Long term (current) use of oral hypoglycemic drugs: Secondary | ICD-10-CM

## 2022-05-28 DIAGNOSIS — M960 Pseudarthrosis after fusion or arthrodesis: Secondary | ICD-10-CM | POA: Diagnosis present

## 2022-05-28 DIAGNOSIS — E785 Hyperlipidemia, unspecified: Secondary | ICD-10-CM | POA: Diagnosis present

## 2022-05-28 DIAGNOSIS — N400 Enlarged prostate without lower urinary tract symptoms: Secondary | ICD-10-CM | POA: Diagnosis present

## 2022-05-28 HISTORY — PX: HARDWARE REMOVAL: SHX979

## 2022-05-28 HISTORY — PX: FOOT ARTHRODESIS: SHX1655

## 2022-05-28 LAB — CBC
HCT: 45.4 % (ref 39.0–52.0)
Hemoglobin: 15.3 g/dL (ref 13.0–17.0)
MCH: 29.2 pg (ref 26.0–34.0)
MCHC: 33.7 g/dL (ref 30.0–36.0)
MCV: 86.6 fL (ref 80.0–100.0)
Platelets: 235 10*3/uL (ref 150–400)
RBC: 5.24 MIL/uL (ref 4.22–5.81)
RDW: 13.4 % (ref 11.5–15.5)
WBC: 5.3 10*3/uL (ref 4.0–10.5)
nRBC: 0 % (ref 0.0–0.2)

## 2022-05-28 LAB — BASIC METABOLIC PANEL
Anion gap: 9 (ref 5–15)
BUN: 18 mg/dL (ref 6–20)
CO2: 25 mmol/L (ref 22–32)
Calcium: 9.2 mg/dL (ref 8.9–10.3)
Chloride: 98 mmol/L (ref 98–111)
Creatinine, Ser: 1.11 mg/dL (ref 0.61–1.24)
GFR, Estimated: >60 mL/min (ref >60)
Glucose, Bld: 97 mg/dL (ref 70–99)
Potassium: 3.8 mmol/L (ref 3.5–5.1)
Sodium: 132 mmol/L — ABNORMAL LOW (ref 135–145)

## 2022-05-28 LAB — GLUCOSE, CAPILLARY
Glucose-Capillary: 116 mg/dL — ABNORMAL HIGH (ref 70–99)
Glucose-Capillary: 151 mg/dL — ABNORMAL HIGH (ref 70–99)
Glucose-Capillary: 218 mg/dL — ABNORMAL HIGH (ref 70–99)

## 2022-05-28 SURGERY — REMOVAL, HARDWARE
Anesthesia: Regional | Site: Foot | Laterality: Left

## 2022-05-28 MED ORDER — METHOCARBAMOL 500 MG PO TABS
500.0000 mg | ORAL_TABLET | Freq: Four times a day (QID) | ORAL | Status: DC | PRN
  Administered 2022-05-28 – 2022-05-31 (×7): 500 mg via ORAL
  Filled 2022-05-28 (×7): qty 1

## 2022-05-28 MED ORDER — ACETAMINOPHEN 500 MG PO TABS
1000.0000 mg | ORAL_TABLET | Freq: Once | ORAL | Status: AC
  Administered 2022-05-28: 1000 mg via ORAL
  Filled 2022-05-28: qty 2

## 2022-05-28 MED ORDER — ACETAMINOPHEN 325 MG PO TABS: 325.0000 mg | ORAL_TABLET | Freq: Four times a day (QID) | ORAL | Status: DC | PRN

## 2022-05-28 MED ORDER — PROPOFOL 10 MG/ML IV BOLUS
INTRAVENOUS | Status: DC | PRN
  Administered 2022-05-28: 200 mg via INTRAVENOUS

## 2022-05-28 MED ORDER — CEFAZOLIN SODIUM-DEXTROSE 2-4 GM/100ML-% IV SOLN
2.0000 g | Freq: Four times a day (QID) | INTRAVENOUS | Status: AC
  Administered 2022-05-28 – 2022-05-29 (×3): 2 g via INTRAVENOUS
  Filled 2022-05-28 (×3): qty 100

## 2022-05-28 MED ORDER — HYDROCHLOROTHIAZIDE 25 MG PO TABS
25.0000 mg | ORAL_TABLET | Freq: Every morning | ORAL | Status: DC
  Administered 2022-05-28 – 2022-05-31 (×4): 25 mg via ORAL
  Filled 2022-05-28 (×4): qty 1

## 2022-05-28 MED ORDER — CEFAZOLIN IN SODIUM CHLORIDE 3-0.9 GM/100ML-% IV SOLN
3.0000 g | INTRAVENOUS | Status: AC
  Administered 2022-05-28: 3 g via INTRAVENOUS
  Filled 2022-05-28: qty 100

## 2022-05-28 MED ORDER — METOCLOPRAMIDE HCL 5 MG PO TABS: 5.0000 mg | ORAL_TABLET | Freq: Three times a day (TID) | ORAL | Status: DC | PRN

## 2022-05-28 MED ORDER — MIDAZOLAM HCL 2 MG/2ML IJ SOLN
INTRAMUSCULAR | Status: AC
  Filled 2022-05-28: qty 2

## 2022-05-28 MED ORDER — MORPHINE SULFATE (PF) 2 MG/ML IV SOLN
0.5000 mg | INTRAVENOUS | Status: DC | PRN
  Administered 2022-05-29 – 2022-05-31 (×9): 1 mg via INTRAVENOUS
  Filled 2022-05-28 (×9): qty 1

## 2022-05-28 MED ORDER — ONDANSETRON HCL 4 MG/2ML IJ SOLN
INTRAMUSCULAR | Status: AC
  Filled 2022-05-28: qty 2

## 2022-05-28 MED ORDER — FENTANYL CITRATE (PF) 250 MCG/5ML IJ SOLN
INTRAMUSCULAR | Status: AC
  Filled 2022-05-28: qty 5

## 2022-05-28 MED ORDER — OXYCODONE HCL 5 MG/5ML PO SOLN: 5.0000 mg | Freq: Once | ORAL | Status: DC | PRN

## 2022-05-28 MED ORDER — METHOCARBAMOL 1000 MG/10ML IJ SOLN
500.0000 mg | Freq: Four times a day (QID) | INTRAVENOUS | Status: DC | PRN
  Filled 2022-05-28: qty 5

## 2022-05-28 MED ORDER — OXYCODONE HCL 5 MG PO TABS: 5.0000 mg | ORAL_TABLET | Freq: Once | ORAL | Status: DC | PRN

## 2022-05-28 MED ORDER — AMISULPRIDE (ANTIEMETIC) 5 MG/2ML IV SOLN: 10.0000 mg | Freq: Once | INTRAVENOUS | Status: DC | PRN

## 2022-05-28 MED ORDER — ONDANSETRON HCL 4 MG PO TABS: 4.0000 mg | ORAL_TABLET | Freq: Four times a day (QID) | ORAL | Status: DC | PRN

## 2022-05-28 MED ORDER — 0.9 % SODIUM CHLORIDE (POUR BTL) OPTIME
TOPICAL | Status: DC | PRN
  Administered 2022-05-28: 1000 mL

## 2022-05-28 MED ORDER — HYDROCODONE-ACETAMINOPHEN 5-325 MG PO TABS: 1.0000 | ORAL_TABLET | ORAL | Status: DC | PRN

## 2022-05-28 MED ORDER — HYDROCODONE-ACETAMINOPHEN 7.5-325 MG PO TABS
1.0000 | ORAL_TABLET | ORAL | Status: DC | PRN
  Administered 2022-05-28 – 2022-05-31 (×13): 2 via ORAL
  Filled 2022-05-28 (×14): qty 2

## 2022-05-28 MED ORDER — METOCLOPRAMIDE HCL 5 MG/ML IJ SOLN: 5.0000 mg | Freq: Three times a day (TID) | INTRAMUSCULAR | Status: DC | PRN

## 2022-05-28 MED ORDER — METFORMIN HCL 500 MG PO TABS
500.0000 mg | ORAL_TABLET | Freq: Every day | ORAL | Status: DC
  Administered 2022-05-29 – 2022-05-31 (×3): 500 mg via ORAL
  Filled 2022-05-28 (×3): qty 1

## 2022-05-28 MED ORDER — ONDANSETRON HCL 4 MG/2ML IJ SOLN: 4.0000 mg | Freq: Four times a day (QID) | INTRAMUSCULAR | Status: DC | PRN

## 2022-05-28 MED ORDER — LIDOCAINE 2% (20 MG/ML) 5 ML SYRINGE
INTRAMUSCULAR | Status: AC
  Filled 2022-05-28: qty 5

## 2022-05-28 MED ORDER — LACTATED RINGERS IV SOLN: INTRAVENOUS | Status: DC

## 2022-05-28 MED ORDER — ONDANSETRON HCL 4 MG/2ML IJ SOLN: 4.0000 mg | Freq: Once | INTRAMUSCULAR | Status: DC | PRN

## 2022-05-28 MED ORDER — MEPERIDINE HCL 25 MG/ML IJ SOLN: 6.2500 mg | INTRAMUSCULAR | Status: DC | PRN

## 2022-05-28 MED ORDER — FENTANYL CITRATE (PF) 100 MCG/2ML IJ SOLN
INTRAMUSCULAR | Status: DC | PRN
  Administered 2022-05-28 (×5): 50 ug via INTRAVENOUS

## 2022-05-28 MED ORDER — INSULIN ASPART 100 UNIT/ML IJ SOLN: 0.0000 [IU] | INTRAMUSCULAR | Status: DC | PRN

## 2022-05-28 MED ORDER — PHENYLEPHRINE HCL-NACL 20-0.9 MG/250ML-% IV SOLN
INTRAVENOUS | Status: DC | PRN
  Administered 2022-05-28: 40 ug/min via INTRAVENOUS

## 2022-05-28 MED ORDER — DOCUSATE SODIUM 100 MG PO CAPS
100.0000 mg | ORAL_CAPSULE | Freq: Two times a day (BID) | ORAL | Status: DC
  Administered 2022-05-28 – 2022-05-31 (×4): 100 mg via ORAL
  Filled 2022-05-28 (×6): qty 1

## 2022-05-28 MED ORDER — PHENYLEPHRINE 80 MCG/ML (10ML) SYRINGE FOR IV PUSH (FOR BLOOD PRESSURE SUPPORT)
PREFILLED_SYRINGE | INTRAVENOUS | Status: DC | PRN
  Administered 2022-05-28: 80 ug via INTRAVENOUS
  Administered 2022-05-28 (×3): 160 ug via INTRAVENOUS

## 2022-05-28 MED ORDER — DIPHENHYDRAMINE HCL 12.5 MG/5ML PO ELIX: 12.5000 mg | ORAL_SOLUTION | ORAL | Status: DC | PRN

## 2022-05-28 MED ORDER — KETOROLAC TROMETHAMINE 30 MG/ML IJ SOLN: 30.0000 mg | Freq: Once | INTRAMUSCULAR | Status: DC | PRN

## 2022-05-28 MED ORDER — NAPROXEN 250 MG PO TABS
250.0000 mg | ORAL_TABLET | Freq: Two times a day (BID) | ORAL | Status: DC
  Administered 2022-05-28 – 2022-05-31 (×6): 250 mg via ORAL
  Filled 2022-05-28 (×6): qty 1

## 2022-05-28 MED ORDER — PROPOFOL 10 MG/ML IV BOLUS
INTRAVENOUS | Status: AC
  Filled 2022-05-28: qty 20

## 2022-05-28 MED ORDER — ORAL CARE MOUTH RINSE: 15.0000 mL | Freq: Once | OROMUCOSAL | Status: AC

## 2022-05-28 MED ORDER — ONDANSETRON HCL 4 MG/2ML IJ SOLN
INTRAMUSCULAR | Status: DC | PRN
  Administered 2022-05-28: 4 mg via INTRAVENOUS

## 2022-05-28 MED ORDER — DEXAMETHASONE SODIUM PHOSPHATE 10 MG/ML IJ SOLN
INTRAMUSCULAR | Status: AC
  Filled 2022-05-28: qty 1

## 2022-05-28 MED ORDER — LIDOCAINE HCL (CARDIAC) PF 100 MG/5ML IV SOSY
PREFILLED_SYRINGE | INTRAVENOUS | Status: DC | PRN
  Administered 2022-05-28: 60 mg via INTRATRACHEAL

## 2022-05-28 MED ORDER — DEXAMETHASONE SODIUM PHOSPHATE 10 MG/ML IJ SOLN
INTRAMUSCULAR | Status: DC | PRN
  Administered 2022-05-28: 5 mg via INTRAVENOUS

## 2022-05-28 MED ORDER — HYDROMORPHONE HCL 1 MG/ML IJ SOLN: 0.2500 mg | INTRAMUSCULAR | Status: DC | PRN

## 2022-05-28 MED ORDER — MIDAZOLAM HCL 5 MG/5ML IJ SOLN
INTRAMUSCULAR | Status: DC | PRN
  Administered 2022-05-28: 2 mg via INTRAVENOUS

## 2022-05-28 MED ORDER — CHLORHEXIDINE GLUCONATE 0.12 % MT SOLN
15.0000 mL | Freq: Once | OROMUCOSAL | Status: AC
  Administered 2022-05-28: 15 mL via OROMUCOSAL
  Filled 2022-05-28: qty 15

## 2022-05-28 SURGICAL SUPPLY — 91 items
BANDAGE ESMARK 6X9 LF (GAUZE/BANDAGES/DRESSINGS) IMPLANT
BENZOIN TINCTURE PRP APPL 2/3 (GAUZE/BANDAGES/DRESSINGS) IMPLANT
BIT DRILL 4.8X200 CANN (BIT) IMPLANT
BIT DRILL CANN 3.5MM (DRILL) IMPLANT
BIT DRILL STRATUM ST W/SLV 2.7 (BIT) IMPLANT
BLADE SURG 15 STRL LF DISP TIS (BLADE) ×6 IMPLANT
BLADE SURG 15 STRL SS (BLADE) ×6
BNDG ELASTIC 4X5.8 VLCR STR LF (GAUZE/BANDAGES/DRESSINGS) IMPLANT
BNDG ELASTIC 6X5.8 VLCR STR LF (GAUZE/BANDAGES/DRESSINGS) ×4 IMPLANT
BNDG ESMARK 4X9 LF (GAUZE/BANDAGES/DRESSINGS) IMPLANT
BNDG ESMARK 6X9 LF (GAUZE/BANDAGES/DRESSINGS)
CHLORAPREP W/TINT 26 (MISCELLANEOUS) ×2 IMPLANT
CLSR STERI-STRIP ANTIMIC 1/2X4 (GAUZE/BANDAGES/DRESSINGS) IMPLANT
COVER BACK TABLE 60X90IN (DRAPES) ×2 IMPLANT
CUFF TOURN SGL QUICK 34 (TOURNIQUET CUFF) ×2
CUFF TRNQT CYL 34X4.125X (TOURNIQUET CUFF) ×2 IMPLANT
DRAPE C-ARM 42X120 X-RAY (DRAPES) IMPLANT
DRAPE C-ARM 42X72 X-RAY (DRAPES) ×2 IMPLANT
DRAPE IMP U-DRAPE 54X76 (DRAPES) ×2 IMPLANT
DRAPE OEC MINIVIEW 54X84 (DRAPES) ×2 IMPLANT
DRAPE U-SHAPE 47X51 STRL (DRAPES) ×2 IMPLANT
DRILL CANN 3.5MM (DRILL) ×2
DRILL STRATUM ST W/SLV 2.7 (BIT) ×2
DRIVER STRM ST T10 (ORTHOPEDIC DISPOSABLE SUPPLIES) IMPLANT
ELECT REM PT RETURN 9FT ADLT (ELECTROSURGICAL) ×2
ELECTRODE REM PT RTRN 9FT ADLT (ELECTROSURGICAL) ×2 IMPLANT
GAUZE PAD ABD 8X10 STRL (GAUZE/BANDAGES/DRESSINGS) IMPLANT
GAUZE SPONGE 4X4 12PLY STRL (GAUZE/BANDAGES/DRESSINGS) ×2 IMPLANT
GAUZE XEROFORM 1X8 LF (GAUZE/BANDAGES/DRESSINGS) ×2 IMPLANT
GLOVE BIOGEL M STRL SZ7.5 (GLOVE) ×8 IMPLANT
GLOVE BIOGEL PI IND STRL 8 (GLOVE) ×4 IMPLANT
GOWN STRL REUS W/ TWL LRG LVL3 (GOWN DISPOSABLE) ×2 IMPLANT
GOWN STRL REUS W/ TWL XL LVL3 (GOWN DISPOSABLE) ×4 IMPLANT
GOWN STRL REUS W/TWL LRG LVL3 (GOWN DISPOSABLE) ×2
GOWN STRL REUS W/TWL XL LVL3 (GOWN DISPOSABLE) ×4
K-WIRE 1.8 (WIRE) ×2
K-WIRE FX200X1.8XTROC TIP (WIRE) ×2
K-WIRE TROC 1.25X150 (WIRE) ×2
KIT BASIN OR (CUSTOM PROCEDURE TRAY) ×2 IMPLANT
KIT BIOCARTILAGE LG JOINT MIX (KITS) IMPLANT
KIT STRATUM INSTRUMENT STD (KITS) IMPLANT
KWIRE FX200X1.8XTROC TIP (WIRE) IMPLANT
KWIRE TROC 1.25X150 (WIRE) IMPLANT
NDL HYPO 25X1 1.5 SAFETY (NEEDLE) IMPLANT
NEEDLE HYPO 25X1 1.5 SAFETY (NEEDLE) IMPLANT
NS IRRIG 1000ML POUR BTL (IV SOLUTION) ×2 IMPLANT
NUT THRD WIRE ZI (ORTHOPEDIC DISPOSABLE SUPPLIES) IMPLANT
PACK ORTHO EXTREMITY (CUSTOM PROCEDURE TRAY) ×2 IMPLANT
PAD CAST 4YDX4 CTTN HI CHSV (CAST SUPPLIES) ×2 IMPLANT
PADDING CAST COTTON 4X4 STRL (CAST SUPPLIES) ×2
PADDING CAST SYNTHETIC 4X4 STR (CAST SUPPLIES) ×2 IMPLANT
PIN GUIDE DRIL TIP 2.8X300 STE (PIN) IMPLANT
PIN GUIDE THRD TT 3.8 (PIN) IMPLANT
PLATE FUSION CC LRG LT (Plate) IMPLANT
PLATE TN STRM SM LT (Plate) IMPLANT
PUTTY DBM ALLOSYNC PURE 5CC (Putty) IMPLANT
PUTTY DBM STAGRAFT PLUS 5CC (Putty) IMPLANT
SCREW CANN 5.0X50MM (Screw) ×2 IMPLANT
SCREW CANN FTHRD 8X40X16 (Screw) IMPLANT
SCREW CANN PT 50X5XCANN NS MTP (Screw) ×2 IMPLANT
SCREW CANN PT 5X50 NS (Screw) IMPLANT
SCREW CANNULATED HIP 8.0X75MM (Screw) IMPLANT
SCREW LOCK STRATUM 3.5X18 (Screw) IMPLANT
SCREW LOCK STRATUM 3.5X20 (Screw) IMPLANT
SCREW LOCKING STRM 3.5X28 ST (Screw) IMPLANT
SCREW MDS ST STRM 3.5X24 (Screw) IMPLANT
SCREW MDS ST STRM 3.5X32 (Screw) IMPLANT
SCREW STRM LOCK 3.5X22 (Screw) IMPLANT
SHEET MEDIUM DRAPE 40X70 STRL (DRAPES) ×2 IMPLANT
SLEEVE SCD COMPRESS KNEE MED (STOCKING) ×2 IMPLANT
SPIKE FLUID TRANSFER (MISCELLANEOUS) IMPLANT
SPLINT PLASTER CAST FAST 5X30 (CAST SUPPLIES) ×40 IMPLANT
SPLINT PLASTER CAST XFAST 5X30 (CAST SUPPLIES) IMPLANT
SPONGE T-LAP 18X18 ~~LOC~~+RFID (SPONGE) IMPLANT
STOCKINETTE 6  STRL (DRAPES) ×2
STOCKINETTE 6 STRL (DRAPES) ×2 IMPLANT
STRIP CLOSURE SKIN 1/2X4 (GAUZE/BANDAGES/DRESSINGS) IMPLANT
SUCTION FRAZIER HANDLE 10FR (MISCELLANEOUS) ×2
SUCTION TUBE FRAZIER 10FR DISP (MISCELLANEOUS) ×2 IMPLANT
SUT ETHILON 3 0 PS 1 (SUTURE) ×2 IMPLANT
SUT MNCRL AB 3-0 PS2 18 (SUTURE) ×2 IMPLANT
SUT PDS AB 2-0 CT2 27 (SUTURE) ×2 IMPLANT
SUT VIC AB 3-0 FS2 27 (SUTURE) IMPLANT
SYR 5ML LL (SYRINGE) ×2 IMPLANT
SYR BULB EAR ULCER 3OZ GRN STR (SYRINGE) ×2 IMPLANT
SYR CONTROL 10ML LL (SYRINGE) IMPLANT
TOWEL GREEN STERILE FF (TOWEL DISPOSABLE) ×4 IMPLANT
TUBE CONNECTING 20X1/4 (TUBING) ×2 IMPLANT
UNDERPAD 30X36 HEAVY ABSORB (UNDERPADS AND DIAPERS) ×2 IMPLANT
WEDGE BONE EVANS 17.5X21X9 (Orthopedic Implant) IMPLANT
WIRE GUIDE TROCAR 1.1X150 (WIRE) IMPLANT

## 2022-05-28 NOTE — H&P (Signed)
PREOPERATIVE H&P  Chief Complaint: Left foot pain  HPI: Troy Arellano is a 57 y.o. male who presents for preoperative history and physical with a diagnosis of left attempted triple arthrodesis with Posey Pronto, D.P.M. who had failure and malalignment.  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  He is here today for revision surgery in the form of hardware removal and revision triple arthrodesis.  Patient has been unable to resume normal activities due to continued discomfort.  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  Symptoms are rated as moderate to severe, and have been worsening.  This is significantly impairing activities of daily living.  He has elected for surgical management.   Past Medical History:  Diagnosis Date   Arthritis    Back   Benign localized prostatic hyperplasia with lower urinary tract symptoms (LUTS)    urologist--- Troy Arellano   Bilateral recurrent inguinal hernia    GERD (gastroesophageal reflux disease)    History of adenomatous polyp of colon    History of COVID-19 05/2020   per pt mild symptoms that resovled   Hyperlipidemia    Hypertension    followed by pcp   Type 2 diabetes mellitus ([place])    followed by pcp   (07-17-2021  per pt does not take check blood sugar )   Past Surgical History:  Procedure Laterality Date   COLONOSCOPY  12/28/2020   by Troy h. danis   INGUINAL HERNIA REPAIR Bilateral    right 1987 and left 1988   INGUINAL HERNIA REPAIR Bilateral 07/20/2021   Procedure: LAPAROSCOPIC RECURRENT BILATERAL INGUINAL HERNIA REPAIR WITH MESH;  Surgeon: Troy Arellano;  Location: [facility];  Service: General;  Laterality: Bilateral;   Social History   Socioeconomic History   Marital status: Married    Spouse name: Not on file   Number of children: Not on file   Years of education: Not on file   Highest education level: Not on file  Occupational History   Not on file  Tobacco Use   Smoking status: Never    Smokeless tobacco: Never  Vaping Use   Vaping Use: Never used  Substance and Sexual Activity   Alcohol use: Yes    Alcohol/week: 2.0 - 3.0 standard drinks of alcohol    Types: 2 - 3 Standard drinks or equivalent per week    Comment: occasionally   Drug use: No   Sexual activity: Yes  Other Topics Concern   Not on file  Social History Narrative   Forklift driver   Single   52 yo son (in [place])   Social Determinants of Health   Financial Resource Strain: Not on file  Food Insecurity: Not on file  Transportation Needs: Not on file  Physical Activity: Not on file  Stress: Not on file  Social Connections: Not on file   Family History  Problem Relation Age of Onset   Hypertension Mother    Hypertension Father    Heart failure Father    Lymphoma Sister    Colon cancer Neg Hx    Esophageal cancer Neg Hx    Rectal cancer Neg Hx    Stomach cancer Neg Hx    Colon polyps Neg Hx    No Known Allergies Prior to Admission medications   Medication Sig Start Date End Date Taking? Authorizing Provider  hydrochlorothiazide (HYDRODIURIL) 25 MG tablet TAKE 1 TABLET BY MOUTH EVERY DAY Patient taking differently: Take 25 mg by mouth  every morning. 12/13/21  Yes Troy Arellano  hydrOXYzine (VISTARIL) 25 MG capsule Take 1 capsule (25 mg total) by mouth at bedtime as needed (sleep). 05/09/22  Yes Troy Arellano  ibuprofen (ADVIL) 800 MG tablet Take 800 mg by mouth every 6 (six) hours as needed for moderate pain or mild pain. 05/09/22  Yes Troy Arellano  metFORMIN (GLUCOPHAGE) 500 MG tablet Take 1 tablet (500 mg total) by mouth daily with breakfast. 08/23/20  Yes Troy Arellano  traMADol (ULTRAM) 50 MG tablet Take 50 mg by mouth every 6 (six) hours as needed for moderate pain or severe pain. 05/09/22  Yes Troy Arellano     Positive ROS: All other systems have been reviewed and were otherwise negative with the exception of those mentioned in the HPI and as  above.  Physical Exam:  Vitals:   05/28/22 0622  BP: 124/83  Pulse: 70  Resp: 17  Temp: 98.1 F (36.7 C)  SpO2: 97%   General: Alert, no acute distress Cardiovascular: No pedal edema Respiratory: No cyanosis, no use of accessory musculature GI: No organomegaly, abdomen is soft and non-tender Skin: No lesions in the area of chief complaint Neurologic: Sensation intact distally Psychiatric: Patient is competent for consent with normal mood and affect Lymphatic: No axillary or cervical lymphadenopathy  MUSCULOSKELETAL: Left foot demonstrates swelling and significant valgus.  Tender to palpation dorsally, laterally, medially and posteriorly about the ankle and hindfoot.  Pain with attempted manipulation of the triple joints.  Distally foot is warm and well-perfused.  Surgical incisions are well-healed.  Assessment: Status post left triple arthrodesis with Posey Pronto, D.P.M. with nonunion and malposition joints with continued discomfort.    Plan: Plan for revision triple arthrodesis with hardware removal.  We will more appropriately position his subtalar joint, talonavicular joint and calcaneocuboid joint to allow for acceptable bony healing.  He does have some erosive changes within the anterior process of the calcaneus due to malposition calcaneocuboid joint but we will attempt to realign this to give him better function and pain relief.  Will plan for him to stay the night in the hospital and we will plan to place a drain that will be removed postop day 1.  We did discuss the risks of revision arthrodesis surgery and the increased risk of incomplete healing and continued pain.  Despite these risks he would like to proceed with surgery as the pain that he is having now is quite debilitating.  We discussed the risks, benefits and alternatives of surgery which include but are not limited to wound healing complications, infection, nonunion, malunion, need for further surgery, damage to  surrounding structures and continued pain.  They understand there is no guarantees to an acceptable outcome.  After weighing these risks they opted to proceed with surgery.     Troy Arellano    05/28/2022 7:15 AM

## 2022-05-28 NOTE — Anesthesia Postprocedure Evaluation (Signed)
Anesthesia Post Note  Patient: Troy Arellano  Procedure(s) Performed: DEEP ORTHOPEDIC HARDWARE REMOVAL, LEFT FOOT POSTERIOR, MEDIAL AND LATERAL (Left: Ankle) REVISION TRIPLE ARTHRODESIS (Left: Foot)     Patient location during evaluation: PACU Anesthesia Type: Regional and General Level of consciousness: awake and alert, oriented and patient cooperative Pain management: pain level controlled Vital Signs Assessment: post-procedure vital signs reviewed and stable Respiratory status: spontaneous breathing, nonlabored ventilation and respiratory function stable Cardiovascular status: blood pressure returned to baseline and stable Postop Assessment: no apparent nausea or vomiting Anesthetic complications: no   No notable events documented.  Last Vitals:  Vitals:   05/28/22 1230 05/28/22 1245  BP: 111/69 115/71  Pulse: 82 89  Resp: 15 (!) 22  Temp:    SpO2: 96% 96%    Last Pain:  Vitals:   05/28/22 1230  TempSrc:   PainSc: 0-No pain                 Pervis Hocking

## 2022-05-28 NOTE — Brief Op Note (Signed)
05/28/2022  11:12 AM  PATIENT:  Troy Arellano  57 y.o. male  PRE-OPERATIVE DIAGNOSIS:  FAILED TRIPLE ARTHRODESIS, PAINFUL ORTHOPEDIC HARDWARE, LEFT FOOT  POST-OPERATIVE DIAGNOSIS:  FAILED TRIPLE ARTHRODESIS, PAINFUL ORTHOPEDIC HARDWARE, LEFT FOOT  PROCEDURE:  Procedure(s) with comments: DEEP ORTHOPEDIC HARDWARE REMOVAL, LEFT FOOT POSTERIOR, MEDIAL AND LATERAL (Left) - LENGTH OF SURGERY: 180 MINUTES REVISION TRIPLE ARTHRODESIS (Left) Ankle arthrotomy   SURGEON:  Surgeon(s) and Role:    Erle Crocker, MD - Primary  PHYSICIAN ASSISTANT: J. Martinique PA-C  ASSISTANTS: none   ANESTHESIA:   general  EBL:  200 mL   BLOOD ADMINISTERED:none  DRAINS: (34F) Jackson-Pratt drain(s) with closed bulb suction in the Foot    LOCAL MEDICATIONS USED:  NONE  SPECIMEN:  No Specimen  DISPOSITION OF SPECIMEN:  N/A  COUNTS:  YES  TOURNIQUET:  * Missing tourniquet times found for documented tourniquets in log: [id number] * Total Tourniquet Time Documented: Thigh (Left) - 120 minutes Total: Thigh (Left) - 120 minutes   DICTATION: .Viviann Spare Dictation  PLAN OF CARE: Admit for overnight observation  PATIENT DISPOSITION:  PACU - hemodynamically stable.   Delay start of Pharmacological VTE agent (>24hrs) due to surgical blood loss or risk of bleeding: yes

## 2022-05-28 NOTE — Progress Notes (Signed)
PT Cancellation Note  Patient Details Name: Troy Arellano MRN: [id number] DOB: 10-Jun-1965   Cancelled Treatment:    Reason Eval/Treat Not Completed: Patient declined, no reason specified. Pt declines PT on 2nd attempt. PT will return tomorrow.   Zenaida Niece 05/28/2022, 4:42 PM

## 2022-05-28 NOTE — Progress Notes (Signed)
PT Cancellation Note  Patient Details Name: Troy Arellano MRN: [id number] DOB: Jul 04, 1965   Cancelled Treatment:    Reason Eval/Treat Not Completed: Patient declined, no reason specified. Pt is refusing PT evaluation at this time, reporting he is starting to have some pain in L knee and is also scared about mobilizing so soon. PT provides reassurance however the pt remains resistant to evaluation at this time. PT will attempt to follow up as time allows.   Zenaida Niece 05/28/2022, 2:26 PM

## 2022-05-28 NOTE — Progress Notes (Signed)
OT Cancellation Note  Patient Details Name: Troy Arellano MRN: [id number] DOB: 31-Mar-1966   Cancelled Treatment:    Reason Eval/Treat Not Completed: Other (comment). Secure chat from PT and spoke to PT in person and pt refusing to get up now due he is starting to have some pain in L knee and is also scared about mobilizing so soon.   Golden Circle, OTR/L [facility] [phone] Office [phone]   Almon Register 05/28/2022, 2:31 PM

## 2022-05-28 NOTE — Transfer of Care (Signed)
Immediate Anesthesia Transfer of Care Note  Patient: Troy Arellano  Procedure(s) Performed: DEEP ORTHOPEDIC HARDWARE REMOVAL, LEFT FOOT POSTERIOR, MEDIAL AND LATERAL (Left: Ankle) REVISION TRIPLE ARTHRODESIS (Left: Foot)  Patient Location: PACU  Anesthesia Type:GA combined with regional for post-op pain  Level of Consciousness: drowsy and patient cooperative  Airway & Oxygen Therapy: Patient Spontanous Breathing and Patient connected to nasal cannula oxygen  Post-op Assessment: Report given to RN, Post -op Vital signs reviewed and stable, and Patient moving all extremities  Post vital signs: Reviewed and stable  Last Vitals:  Vitals Value Taken Time  BP 113/67 05/28/22 1158  Temp    Pulse 81 05/28/22 1200  Resp 15 05/28/22 1200  SpO2 95 % 05/28/22 1200  Vitals shown include unvalidated device data.  Last Pain:  Vitals:   05/28/22 0622  TempSrc: Oral  PainSc:       Patients Stated Pain Goal: 0 (13/14/38 8875)  Complications: No notable events documented.

## 2022-05-28 NOTE — Anesthesia Preprocedure Evaluation (Addendum)
Anesthesia Evaluation  Patient identified by MRN, date of birth, ID band Patient awake    Reviewed: Allergy & Precautions, NPO status , Patient's Chart, lab work & pertinent test results  Airway Mallampati: III  TM Distance: >3 FB Neck ROM: Full    Dental no notable dental hx.    Pulmonary neg pulmonary ROS   Pulmonary exam normal breath sounds clear to auscultation       Cardiovascular hypertension (130/80 preop), Pt. on medications Normal cardiovascular exam Rhythm:Regular Rate:Normal     Neuro/Psych  Headaches  negative psych ROS   GI/Hepatic Neg liver ROS,GERD  Controlled,,  Endo/Other  diabetes, Well Controlled, Type 2, Oral Hypoglycemic Agents  A1c 6.1  Renal/GU negative Renal ROS  negative genitourinary   Musculoskeletal  (+) Arthritis , Osteoarthritis,    Abdominal   Peds  Hematology negative hematology ROS (+)   Anesthesia Other Findings   Reproductive/Obstetrics negative OB ROS                             Anesthesia Physical Anesthesia Plan  ASA: 2  Anesthesia Plan: General and Regional   Post-op Pain Management: Regional block*, Toradol IV (intra-op)* and Tylenol PO (pre-op)*   Induction: Intravenous  PONV Risk Score and Plan: 2 and Ondansetron, Dexamethasone, Midazolam and Treatment may vary due to age or medical condition  Airway Management Planned: LMA  Additional Equipment: None  Intra-op Plan:   Post-operative Plan: Extubation in OR  Informed Consent: I have reviewed the patients History and Physical, chart, labs and discussed the procedure including the risks, benefits and alternatives for the proposed anesthesia with the patient or authorized representative who has indicated his/her understanding and acceptance.     Dental advisory given  Plan Discussed with: CRNA  Anesthesia Plan Comments:         Anesthesia Quick Evaluation

## 2022-05-28 NOTE — Anesthesia Procedure Notes (Signed)
Procedure Name: LMA Insertion Date/Time: 05/28/2022 7:41 AM  Performed by: Moshe Salisbury, CRNAPre-anesthesia Checklist: Patient identified, Emergency Drugs available, Suction available and Patient being monitored Patient Re-evaluated:Patient Re-evaluated prior to induction Oxygen Delivery Method: Circle System Utilized Preoxygenation: Pre-oxygenation with 100% oxygen Induction Type: IV induction Ventilation: Mask ventilation without difficulty LMA: LMA inserted LMA Size: 5.0 Number of attempts: 1 Placement Confirmation: positive ETCO2 Tube secured with: Tape Dental Injury: Teeth and Oropharynx as per pre-operative assessment

## 2022-05-29 LAB — GLUCOSE, CAPILLARY: Glucose-Capillary: 151 mg/dL — ABNORMAL HIGH (ref 70–99)

## 2022-05-29 MED ORDER — ASPIRIN 325 MG PO TABS
325.0000 mg | ORAL_TABLET | Freq: Every day | ORAL | Status: DC
  Administered 2022-05-29 – 2022-05-31 (×3): 325 mg via ORAL
  Filled 2022-05-29 (×3): qty 1

## 2022-05-29 NOTE — Op Note (Signed)
Troy Arellano male 57 y.o. 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, subtalar joint and calcaneocuboid joint   PostOperative Diagnosis: Same  PROCEDURE: Deep orthopedic hardware removal, left foot posterior Deep orthopedic hardware removal, left foot medial through separate incision Deep orthopedic hardware removal, left foot lateral through separate incision Left ankle arthrotomy with irrigation and removal of loose bodies  Revision triple arthrodesis    SURGEON: Melony Overly, MD  ASSISTANT: Jesse Martinique, PA-C was necessary for patient positioning, prep, drape, assistance with hardware removal, joint prep and placement of hardware  ANESTHESIA: General anesthesia with peripheral nerve block  FINDINGS: Failure of orthopedic hardware medial, lateral and posterior with nonunion of the talonavicular joint, subtalar joint and calcaneocuboid joint.  Hardware was placed within the ankle joint and the dorsal aspect of the talus resulting in loose bodies within the ankle joint necessitating ankle arthrotomy to remove the loose bodies and fragments within the ankle joint.  IMPLANTS: [employer] 8 mm partially-threaded cannulated screws Stratum talonavicular and calcaneocuboid joint arthrodesis plate 5.0 mm partially-threaded cannulated screw  INDICATIONS:56 y.o. maleunderwent triple arthrodesis with podiatry less than one year ago but had significant amount of continued pain and deformity of his foot.  There is concern for hardware failure and nonunion of the joints.  There is also screw placed within the dorsal aspect of the talus within the ankle joint necessitating removal and irrigation of the ankle joint.  He had not had any  significant improvement in his pain symptoms over the past several months despite immobilization, nonweightbearing and activity modification.  Given these findings he was indicated for revision surgery.   Patient understood the risks, benefits and alternatives to surgery which include but are not limited to wound healing complications, infection, nonunion, malunion, need for further surgery as well as damage to surrounding structures. They also understood the potential for continued pain in that there were no guarantees of acceptable outcome After weighing these risks the patient opted to proceed with surgery.  PROCEDURE: Patient was identified in the preoperative holding area.  The left foot was marked by myself.  Consent was signed by myself and the patient.  Block was performed by anesthesia in the preoperative holding area.  Patient was taken to the operative suite and placed supine on the operative table.  General LMA anesthesia was induced without difficulty. Bump was placed under the operative hip and bone foam was used.  All bony prominences were well padded.  Tourniquet was placed on the operative thigh.  Preoperative antibiotics were given. The extremity was prepped and draped in the usual sterile fashion and surgical timeout was performed.  The limb was elevated and the tourniquet was inflated to 250 mmHg.  We started with hardware removal posteriorly.  The leg was elevated and an incision was made overlying the previously placed hardware about the calcaneal tuberosity.  Once incision was made hemostat spreaders were used to gain access to the screw head using blunt dissection.  Then the wire was placed within the screw head and the cannulated screwdriver was placed over the wire and the screw was backed out without difficulty.  The screw was placed on the back table.  We then turned our attention to the medial foot. An incision was made overlying the previously made incision for the insertion of a  headless  compression screw.  The incision was carried down sharply through skin and subcutaneous tissue and blunt dissection was used to mobilize soft tissue overlying the screw head.  Once the screw head was identified a wire was placed within the screw and the screwdriver was used to back out the wire.  This was done uneventfully.  The screw was placed on the back table.  Then a separate incision was made overlying the talonavicular joint between the tibialis anteriorand the extensor hallucis longus tendon about the anterior aspect of the ankle and talonavicular joint.  This was carried sharply down through skin and subcu tissue.  Blunt dissection was used again to the dorsal talonavicular joint the site of previously placed staple.  The blunt dissection was carried out overlying the joint is stable was identified.  Using a Kocher and a Soil scientist the staple was removed and placed on the back table.  We then turned our attention to the lateral foot.  A lateral incision was made utilizing most of the previously made incision.  This was done in a curvilinear fashion to gain access to the subtalar joint and calcaneocuboid joint.  The incision was carried sharply down through skin and subcu tissue.  Blunt dissection was used to mobilize skin flaps overlying the calcaneocuboid joint and the side of the staple.  The blunt dissection was carried down the staple was identified.  The staple was elevated from the bone using a Soil scientist and a Kocher in the staple was removed.  It was placed on a back table.    We then turned attention back to the dorsal foot and ankle.  Separate incision was carried deep about the dorsal wound.  The ankle joint capsule was identified and an arthrotomy was created about the anterior aspect of the ankle joint.  There is a loose floating cartilage fragmentation and bony fragmentation from the malplaced hardware.  The screw head was identified within the dorsal aspect of the talus  anteriorly and the screw was removed.  Then the ankle joint was inspected and irrigated.  There was significant amount of loose bodies within the ankle joint that was removed.  An osteotome was used to remove overgrowth bone about the anterior aspect of the talus and about the anterior aspect of the distal tibia to free up the ankle joint.  After all of the hardware was removed we proceeded to mobilize and prepare the joints.  We began with the subtalar joint.  The joint was fully mobilized using a osteotome and a lamina spreader.  Once the joint was further mobilized curette was used to remove fibrous tissue within the joint surfaces and to remove any remaining cartilage or non-bony structures within the subtalar joint.  There was significant amount of stiffness about the middle facet and the medial aspect of the posterior facet which was mobilized using the osteotome.  Because of the previously attempted fusion this process took twice as long as it normally would in the primary setting making the case much more difficult than a primary triple arthrodesis.  We then proceeded to mobilize the calcaneocuboid joint.  There is a nonunion there and the knife was used to access the joint.  Then an osteotome was used to mobilize the joint.  Curette was used to remove significant amount of fibrous tissue from the attempted fusion of the joint as well as any remaining cartilage and non-bony structures.  Again this was much more difficult than a primary setting given the amount of  fibrous tissue and the previously attempted arthrodesis.  We then turned our attention to the talonavicular joint.  Again an osteotome was used to fully mobilize the talonavicular joint dorsally.  The fibrous tissue was removed using a curette globally about the talonavicular joint to further mobilize the foot for reduction.  Again given the previous attempted arthrodesis and significant fibrous tissue that had formed on the bony surfaces took  twice as long as it normally would and the primary setting to remove these structures and to fully mobilize the joint.  After the joints were acceptably mobilized and prepped a drill was used to trephinate the bony surfaces of the subtalar joint, talonavicular joint and calcaneal cuboid joint.  Then the joints were irrigated and inspected and found to be excessively prepped  We then proceeded with reduction of the subtalar joint.  Osteogenic bone graft was placed within the subtalar joint prior to reduction.  Then the joint was reduced under direct visualization.  A K wire was then placed from the dorsal talar neck directed posterior laterally across the posterior facet of the subtalar joint.  Fluoroscopy confirmed adequate and acceptable reduction of the subtalar joint.  Once confirmed to be appropriate second K wire was placed from the lateral aspect of the calcaneus and run retrograde fashion across the posterior facet of the subtalar joint into the talar body.  Fluoroscopy confirmed appropriate position of the wires end of the joint into partially threaded cannulated screws were placed across the subtalar joint to fixate it.  We then proceeded with reduction of the talonavicular joint.  Osteogenic bone graft was placed within the talonavicular joint.The talonavicular joint was reduced under direct physician held provisionally with K wire fixation.  Then a second K wire was placed to provisionally fixate the joint.  Then a 5.0 mm cannulated screw was placed across 1 of the wires to stabilize the joint.  Then a dorsal talonavicular joint arthrodesis plate was placed with a combination of locking and nonlocking screws. There was good compression and stabilization of the joint after placement of the hardware.  We then turned our attention to the calcaneocuboid joint.  After reduction of the talonavicular joint there was some gapping at the calcaneocuboid joint.  An intercalary wedge was chosen and placed  within the calcaneocuboid joint to adequately reduce and fill the space.  Then the calcaneocuboid joint was reduced with a dorsally directed force on the cuboid and a K wire was placed for provisional fixation.  Then remaining osteogenic bone graft was placed within the void of the calcaneocuboid joint as well as some DBX bone matrix.  Then a calcaneocuboid joint compression arthrodesis plate was placed across the joint spanning the intercalary graft.  Then final fluoroscopic images were obtained.  There is acceptable reduction of the subtalar joint, talonavicular joint and calcaneocuboid joint.  Screw position and placement were acceptable.  A 15 [place] JP drain was placed about the wound with the drain in the subtalar area laterally.  It was taken distal to the lateral incision.  Then the wounds were irrigated with normal saline.  They were all closed in a layered fashion using 2-0 Vicryl, 3-0 Monocryl and 3-0 nylon suture.  Soft dressing and short leg splint was placed.  Tourniquet was released.  Tourniquet was initially up 120 minutes and then released for 30 minutes and placed back up for another 30 minutes.  POST OPERATIVE INSTRUCTIONS: Nonweightbearing to operative extremity Admit for observation After discharge patient will follow up in 2  weeks for splint removal, x-rays of the foot and ankle and placement of a short leg cast. DVT prophylaxis in the hospital and on discharge  TOURNIQUET TIME: See above  BLOOD LOSS:  less than 50 mL         DRAINS: none         SPECIMEN: none       COMPLICATIONS:  * No complications entered in OR log *         Disposition: PACU - hemodynamically stable.         Condition: stable

## 2022-05-29 NOTE — Evaluation (Signed)
Occupational Therapy Evaluation Patient Details Name: Troy Arellano MRN: [id number] DOB: Nov 12, 1965 Today's Date: 05/29/2022   History of Present Illness 57 y.o. male presents to [facility] hospital on 05/28/2022 with failure and malalignment of L attempted triple arthrodesis. Pt underwent hardware removal and revision of triple arthrodesis on 1/23. PMH includes OA, BPH, GERD, HTN, HLD, DMII.   Clinical Impression   Pt in bed upon therapy arrival and agreeable to participate in OT evaluation. Prior to this admit, pt reports that he was independent-Mod I with all ADL tasks and functional transfers. He currently is adapting well and following his WB restrictions while using his RW to do functional mobility within his room. Pt will have assist from his wife upon returning home and will need assist PRN. Patient evaluated by Occupational Therapy with no further acute OT needs identified. All education has been completed and the patient has no further questions. See below for any follow-up Occupational Therapy or equipment needs. OT is signing off. Thank you for this referral.       Recommendations for follow up therapy are one component of a multi-disciplinary discharge planning process, led by the attending physician.  Recommendations may be updated based on patient status, additional functional criteria and insurance authorization.   Follow Up Recommendations  No OT follow up     Assistance Recommended at Discharge PRN  Patient can return home with the following A little help with walking and/or transfers;A little help with bathing/dressing/bathroom;Assistance with cooking/housework;Assist for transportation    Functional Status Assessment  Patient has had a recent decline in their functional status and demonstrates the ability to make significant improvements in function in a reasonable and predictable amount of time.  Equipment Recommendations  BSC/3in1    Recommendations for Other Services        Precautions / Restrictions Precautions Precautions: Fall Restrictions Weight Bearing Restrictions: Yes LLE Weight Bearing: Non weight bearing      Mobility Bed Mobility Overal bed mobility: Modified Independent   Patient Response: Cooperative  Transfers Overall transfer level: Needs assistance Equipment used: Rolling walker (2 wheels) Transfers: Bed to chair/wheelchair/BSC, Sit to/from Stand Sit to Stand: Min guard (for safety)   General transfer comment: VC provided on WB restrictions and visual demonstration for step/hop technique with RW.      Balance Overall balance assessment: Mild deficits observed, not formally tested       ADL either performed or assessed with clinical judgement   ADL Overall ADL's : Needs assistance/impaired Eating/Feeding: Independent;Sitting   Grooming: Modified independent;Sitting   Upper Body Bathing: Set up;Sitting   Lower Body Bathing: Set up;Sit to/from stand;Sitting/lateral leans   Upper Body Dressing : Modified independent;Sitting   Lower Body Dressing: Set up;Sit to/from stand;Sitting/lateral leans   Toilet Transfer: Nature conservation officer;Ambulation;Rolling walker (2 wheels)   Toileting- Clothing Manipulation and Hygiene: Modified independent;Sit to/from stand;Sitting/lateral lean               Vision Baseline Vision/History: 0 No visual deficits Ability to See in Adequate Light: 0 Adequate Patient Visual Report: No change from baseline Vision Assessment?: No apparent visual deficits            Pertinent Vitals/Pain Pain Assessment Pain Assessment: Faces Faces Pain Scale: Hurts a little bit Pain Location: LLE- pt reports that nerve block is beginng to wear off. No numerical pain number provided. Pain Descriptors / Indicators: Grimacing, Operative site guarding Pain Intervention(s): Monitored during session     Hand Dominance Right   Extremity/Trunk Assessment  Upper Extremity Assessment Upper Extremity  Assessment: Overall WFL for tasks assessed   Lower Extremity Assessment Lower Extremity Assessment: Defer to PT evaluation       Communication Communication Communication: No difficulties   Cognition Arousal/Alertness: Awake/alert Behavior During Therapy: WFL for tasks assessed/performed Overall Cognitive Status: Within Functional Limits for tasks assessed                  Home Living Family/patient expects to be discharged to:: Private residence Living Arrangements: Spouse/significant other;Children Available Help at Discharge: Family;Available 24 hours/day Type of Home: House Home Access: Stairs to enter     Home Layout: Two level;Able to live on main level with bedroom/bathroom     Bathroom Shower/Tub: Occupational psychologist: Standard     Home Equipment: Crutches          Prior Functioning/Environment Prior Level of Function : Independent/Modified Independent           OT Problem List: Impaired balance (sitting and/or standing)      OT Treatment/Interventions:   Patient education   OT Goals(Current goals can be found in the care plan section) Acute Rehab OT Goals Patient Stated Goal: to go home  OT Frequency:  1 time visit    Co-evaluation PT/OT/SLP Co-Evaluation/Treatment: Yes Reason for Co-Treatment: To address functional/ADL transfers   OT goals addressed during session: ADL's and self-care;Proper use of Adaptive equipment and DME;Strengthening/ROM      AM-PAC OT "6 Clicks" Daily Activity     Outcome Measure Help from another person eating meals?: None Help from another person taking care of personal grooming?: None Help from another person toileting, which includes using toliet, bedpan, or urinal?: A Little Help from another person bathing (including washing, rinsing, drying)?: A Little Help from another person to put on and taking off regular upper body clothing?: None Help from another person to put on and taking off regular  lower body clothing?: A Little 6 Click Score: 21   End of Session Equipment Utilized During Treatment: Gait belt;Rolling walker (2 wheels) Nurse Communication: Mobility status;Weight bearing status  Activity Tolerance: Patient tolerated treatment well Patient left: in chair;with call bell/phone within reach  OT Visit Diagnosis: Muscle weakness (generalized) (M62.81)                Time: 1657-9038 OT Time Calculation (min): 18 min Charges:  OT General Charges $OT Visit: 1 Visit OT Evaluation $OT Eval Moderate Complexity: 1 Mod  [name], OTR/L,CBIS  Supplemental OT - [facility] and [facility] Secure Chat Preferred    Tyeesha Riker, Clarene Duke 05/29/2022, 3:00 PM

## 2022-05-29 NOTE — Progress Notes (Signed)
Physical Therapy Evaluation Patient Details Name: Troy Arellano MRN: [id number] DOB: 1965/10/11 Today's Date: 05/29/2022  History of Present Illness  57 y.o. male presents to [facility] hospital on 05/28/2022 with failure and malalignment of L attempted triple arthrodesis. Pt underwent hardware removal and revision of triple arthrodesis on 1/23. PMH includes OA, BPH, GERD, HTN, HLD, DMII.  Clinical Impression  Pt was seen for mobility on day 1 after replacement and repair of L ankle hardware.  Pt is motivated to walk and move, and will need to be instructed for safety to use stairs to enter his home by tomorrow.  Follow along for goals of acute PT and focus on walker safety, stair management with crutch and maintaining NWB on all aspects of mobility.  Follow up with no PT as pt is going to be seen by MD for replacement of surgery splint and will need guidance then regarding therapy needs.       Recommendations for follow up therapy are one component of a multi-disciplinary discharge planning process, led by the attending physician.  Recommendations may be updated based on patient status, additional functional criteria and insurance authorization.  Follow Up Recommendations No PT follow up      Assistance Recommended at Discharge Intermittent Supervision/Assistance  Patient can return home with the following  A little help with walking and/or transfers;A little help with bathing/dressing/bathroom;Assistance with cooking/housework;Assist for transportation;Help with stairs or ramp for entrance    Equipment Recommendations Rolling walker (2 wheels);BSC/3in1  Recommendations for Other Services       Functional Status Assessment Patient has had a recent decline in their functional status and demonstrates the ability to make significant improvements in function in a reasonable and predictable amount of time.     Precautions / Restrictions Precautions Precautions: Fall Restrictions Weight Bearing  Restrictions: Yes LLE Weight Bearing: Non weight bearing Other Position/Activity Restrictions: elevate LLE as possible      Mobility  Bed Mobility Overal bed mobility: Modified Independent                  Transfers Overall transfer level: Needs assistance Equipment used: Rolling walker (2 wheels) Transfers: Bed to chair/wheelchair/BSC, Sit to/from Stand Sit to Stand: Min guard   Step pivot transfers: Min guard       General transfer comment: pt was able to maintain WB precautions after walker demo, and after minor instruction for commode    Ambulation/Gait Ambulation/Gait assistance: Min guard Gait Distance (Feet): 70 Feet (25+45) Assistive device: Rolling walker (2 wheels), 1 person hand held assist   Gait velocity: reduced Gait velocity interpretation: <1.31 ft/sec, indicative of household ambulator Pre-gait activities: Standing balance and WB ck General Gait Details: hop steps on RLE to avoid WB on LLE, pt is maintaining with minimal instruction  Stairs            Wheelchair Mobility    Modified Rankin (Stroke Patients Only)       Balance Overall balance assessment: Needs assistance Sitting-balance support: Feet supported Sitting balance-Leahy Scale: Good     Standing balance support: Bilateral upper extremity supported, During functional activity Standing balance-Leahy Scale: Poor                               Pertinent Vitals/Pain Pain Assessment Pain Assessment: Faces Faces Pain Scale: Hurts a little bit Pain Location: starting to lose block benefit Pain Descriptors / Indicators: Guarding, Grimacing Pain Intervention(s): Monitored during session, Repositioned,  Premedicated before session, Limited activity within patient's tolerance    Home Living Family/patient expects to be discharged to:: Private residence Living Arrangements: Spouse/significant other;Children Available Help at Discharge: Family;Available 24  hours/day Type of Home: House Home Access: Stairs to enter Entrance Stairs-Rails: None Entrance Stairs-Number of Steps: 1   Home Layout: Two level;Able to live on main level with bedroom/bathroom Home Equipment: Crutches Additional Comments: prefers walker to crutches which he received Aug 2023    Prior Function Prior Level of Function : Independent/Modified Independent             Mobility Comments: had become able to walk wihtout AD       Hand Dominance   Dominant Hand: Right    Extremity/Trunk Assessment   Upper Extremity Assessment Upper Extremity Assessment: Defer to OT evaluation    Lower Extremity Assessment Lower Extremity Assessment: LLE deficits/detail LLE Deficits / Details: soft wrap and posterior splint to stabilize surgery LLE Coordination: decreased gross motor    Cervical / Trunk Assessment Cervical / Trunk Assessment: Normal  Communication   Communication: No difficulties  Cognition Arousal/Alertness: Awake/alert Behavior During Therapy: WFL for tasks assessed/performed Overall Cognitive Status: Within Functional Limits for tasks assessed                                          General Comments General comments (skin integrity, edema, etc.): Pt is assisted to get to chair from bed with LLE elevated with support.  Pt is agreeable with plan for home and to try a couple steps tomorrow.    Exercises     Assessment/Plan    PT Assessment Patient needs continued PT services  PT Problem List Decreased strength;Decreased range of motion;Decreased activity tolerance;Decreased balance;Decreased mobility;Decreased coordination;Decreased knowledge of use of DME;Decreased skin integrity;Pain       PT Treatment Interventions DME instruction;Gait training;Stair training;Functional mobility training;Therapeutic activities;Therapeutic exercise;Balance training;Neuromuscular re-education;Patient/family education    PT Goals (Current goals  can be found in the Care Plan section)  Acute Rehab PT Goals Patient Stated Goal: to get home with wife PT Goal Formulation: With patient Time For Goal Achievement: 06/05/22 Potential to Achieve Goals: Good    Frequency Min 5X/week     Co-evaluation PT/OT/SLP Co-Evaluation/Treatment: Yes Reason for Co-Treatment: Necessary to address cognition/behavior during functional activity;To address functional/ADL transfers PT goals addressed during session: Mobility/safety with mobility;Balance;Proper use of DME OT goals addressed during session: ADL's and self-care;Proper use of Adaptive equipment and DME;Strengthening/ROM       AM-PAC PT "6 Clicks" Mobility  Outcome Measure Help needed turning from your back to your side while in a flat bed without using bedrails?: None Help needed moving from lying on your back to sitting on the side of a flat bed without using bedrails?: A Little Help needed moving to and from a bed to a chair (including a wheelchair)?: A Little Help needed standing up from a chair using your arms (e.g., wheelchair or bedside chair)?: A Little Help needed to walk in hospital room?: A Little Help needed climbing 3-5 steps with a railing? : A Little 6 Click Score: 19    End of Session Equipment Utilized During Treatment: Gait belt Activity Tolerance: Patient tolerated treatment well Patient left: in chair;with call bell/phone within reach Nurse Communication: Mobility status;Other (comment) (dc planning) PT Visit Diagnosis: Unsteadiness on feet (R26.81);Difficulty in walking, not elsewhere classified (R26.2);Pain Pain -  Right/Left: Left Pain - part of body: Ankle and joints of foot;Leg    Time: 9179-1505 PT Time Calculation (min) (ACUTE ONLY): 18 min   Charges:   PT Evaluation $PT Eval Moderate Complexity: 1 Mod         Ramond Dial 05/29/2022, 4:58 PM  Mee Hives, PT PhD Acute Rehab Dept. Number: [place] and [place]

## 2022-05-29 NOTE — TOC Initial Note (Signed)
Transition of Care [facility]) - Initial/Assessment Note    Patient Details  Name: Troy Arellano MRN: [id number] Date of Birth: 1965/09/14  Transition of Care [facility]) CM/SW Contact:    Sharin Mons, RN Phone Number: 05/29/2022, 4:31 PM  Clinical Narrative:      - s/p underwent hardware removal and revision of triple arthrodesis on 1/23                From home with wife. Wife to assist with care once discharge. Home health and DME orders noted. Pt agreeable to home health services. Pt without provider preference. Referral made with [facility] and accepted. DME: RW and BSC , referral made with [facility] . Equipment will be delivered to bedside prior to d/c. Pt with transportation to home once d/c.   TOC following and will continue to assist with needs...  Expected Discharge Plan: [place] Barriers to Discharge: Continued Medical Work up   Patient Goals and CMS Choice     Choice offered to / list presented to : Patient      Expected Discharge Plan and Services                         DME Arranged: Bedside commode DME Agency: [facility] Date DME Agency Contacted: 05/29/22 Time DME Agency Contacted: [phone] Representative spoke with at DME Agency: [place]: PT, RN [place] Agency: [place] Date [place]: 05/29/22 Time [place]: 1626 Representative spoke with at [place]: [place] Arrangements/Services   Lives with:: Spouse Patient language and need for interpreter reviewed:: Yes Do you feel safe going back to the place where you live?: Yes      Need for Family Participation in Patient Care: Yes (Comment) Care giver support system in place?: Yes (comment)   Criminal Activity/Legal Involvement Pertinent to Current Situation/Hospitalization: No - Comment as needed  Activities of Daily Living Home Assistive Devices/Equipment: None ADL Screening (condition at time of admission) Patient's  cognitive ability adequate to safely complete daily activities?: Yes Is the patient deaf or have difficulty hearing?: No Does the patient have difficulty seeing, even when wearing glasses/contacts?: No Does the patient have difficulty concentrating, remembering, or making decisions?: No Patient able to express need for assistance with ADLs?: Yes Does the patient have difficulty dressing or bathing?: No Independently performs ADLs?: Yes (appropriate for developmental age) Does the patient have difficulty walking or climbing stairs?: No Weakness of Legs: None Weakness of Arms/Hands: None  Permission Sought/Granted                  Emotional Assessment Appearance:: Appears stated age Attitude/Demeanor/Rapport: Engaged Affect (typically observed): Accepting Orientation: : Oriented to Self, Oriented to Place, Oriented to  Time, Oriented to Situation Alcohol / Substance Use: Not Applicable Psych Involvement: No (comment)  Admission diagnosis:  Left foot pain [M79.672] Patient Active Problem List   Diagnosis Date Noted   Left foot pain 12/25/2021   Intractable pain 12/25/2021   Post-operative pain 12/25/2021   Nonintractable headache 10/18/2020   Hyperlipidemia 10/11/2019   Insulin resistance 10/11/2019   Chronic right shoulder pain 08/30/2019   Obesity 08/30/2019   Essential hypertension 08/30/2019   PCP:  [name], PA Pharmacy:   [id number] [place] [place] [id number]: [phone]: [phone]    Social Determinants of  Health (SDOH) Social History: SDOH Screenings   Food Insecurity: No Food Insecurity (05/28/2022)  Transportation Needs: No Transportation Needs (05/28/2022)  Utilities: Not At Risk (05/28/2022)  Depression (PHQ2-9): Low Risk  (10/10/2021)  Tobacco Use: Low Risk  (05/28/2022)   SDOH Interventions:     Readmission Risk Interventions     12/27/2021    8:16 AM  Readmission Risk Prevention Plan  Post Dischage Appt Complete  Medication Screening Complete  Transportation Screening Complete

## 2022-05-29 NOTE — Progress Notes (Signed)
     Troy Arellano is a 57 y.o. male   Orthopaedic diagnosis: Status post revision triple arthrodesis left foot, left ankle arthrotomy 05/28/2022  Subjective: Patient resting comfortably in bed.  He did not work with physical therapy yesterday.  He was worried due to much too soon.  He reports nerve block overall remains intact may be beginning to wear off.  In his prior left foot surgery he had significant pain after the nerve block wore off and is fearful of this. Pain well-controlled on current regimen at this point.  Planning to work with physical therapy today.  No overnight events.  No other issues or concerns.  Drain remains in place with good output.  Objectyive: Vitals:   05/28/22 2006 05/29/22 0627  BP: 120/81 (!) 150/89  Pulse: 97 75  Resp: 18 18  Temp: 98.5 F (36.9 C) 97.8 F (36.6 C)  SpO2: 97% 98%     Exam: Awake and alert Respirations even and unlabored No acute distress  Left foot shows well-fitted, clean, dry short leg cast with drain in place.  This was not removed.  He endorses some sensation to light touch in the toes but remains altered due to nerve block intact.  Is unable to wiggle the toes due to nerve block.  He has good range of motion at the knee.  Exposed skin is benign.  Warm and well-perfused.  Capillary refill is brisk.  Assessment: Postop day 1 status post the above, doing well  Plan: Patient provided reassurance.  He will work with physical therapy today.  He would like to stay 1 more day to make sure he is comfortable with mobilization and pain control as nerve block wears off.  This is reasonable.  Likely plan for discharge home tomorrow.  -We will start 325 aspirin daily for DVT prophylaxis.  He will continue this for 30 days postoperatively. -Pain control as needed.  Plan for oxycodone at discharge -Up with PT/OT today -Nonweightbearing left lower extremity. -Keep splint intact. -Empty drain every 4 hours or as needed.  Will pull drain prior to  discharge.  Plan for outpatient follow-up 2 weeks from surgery with Dr. Lucia Gaskins at [place] for splint removal, suture removal appropriate, and repeat x-rays.  He will be placed into a short leg cast at that time.   Alvira Hecht J. [place], PA-C

## 2022-05-30 DIAGNOSIS — M79672 Pain in left foot: Secondary | ICD-10-CM | POA: Diagnosis present

## 2022-05-30 DIAGNOSIS — Z79899 Other long term (current) drug therapy: Secondary | ICD-10-CM | POA: Diagnosis not present

## 2022-05-30 DIAGNOSIS — K219 Gastro-esophageal reflux disease without esophagitis: Secondary | ICD-10-CM | POA: Diagnosis present

## 2022-05-30 DIAGNOSIS — E119 Type 2 diabetes mellitus without complications: Secondary | ICD-10-CM | POA: Diagnosis present

## 2022-05-30 DIAGNOSIS — M960 Pseudarthrosis after fusion or arthrodesis: Secondary | ICD-10-CM | POA: Diagnosis present

## 2022-05-30 DIAGNOSIS — Y838 Other surgical procedures as the cause of abnormal reaction of the patient, or of later complication, without mention of misadventure at the time of the procedure: Secondary | ICD-10-CM | POA: Diagnosis present

## 2022-05-30 DIAGNOSIS — Z807 Family history of other malignant neoplasms of lymphoid, hematopoietic and related tissues: Secondary | ICD-10-CM | POA: Diagnosis not present

## 2022-05-30 DIAGNOSIS — M24072 Loose body in left ankle: Secondary | ICD-10-CM | POA: Diagnosis present

## 2022-05-30 DIAGNOSIS — M1990 Unspecified osteoarthritis, unspecified site: Secondary | ICD-10-CM | POA: Diagnosis present

## 2022-05-30 DIAGNOSIS — I10 Essential (primary) hypertension: Secondary | ICD-10-CM | POA: Diagnosis present

## 2022-05-30 DIAGNOSIS — Z86010 Personal history of colonic polyps: Secondary | ICD-10-CM | POA: Diagnosis not present

## 2022-05-30 DIAGNOSIS — Z8249 Family history of ischemic heart disease and other diseases of the circulatory system: Secondary | ICD-10-CM | POA: Diagnosis not present

## 2022-05-30 DIAGNOSIS — Z7984 Long term (current) use of oral hypoglycemic drugs: Secondary | ICD-10-CM | POA: Diagnosis not present

## 2022-05-30 DIAGNOSIS — E785 Hyperlipidemia, unspecified: Secondary | ICD-10-CM | POA: Diagnosis present

## 2022-05-30 DIAGNOSIS — Z8616 Personal history of COVID-19: Secondary | ICD-10-CM | POA: Diagnosis not present

## 2022-05-30 DIAGNOSIS — Y828 Other medical devices associated with adverse incidents: Secondary | ICD-10-CM | POA: Diagnosis present

## 2022-05-30 DIAGNOSIS — T84038A Mechanical loosening of other internal prosthetic joint, initial encounter: Secondary | ICD-10-CM | POA: Diagnosis present

## 2022-05-30 DIAGNOSIS — N400 Enlarged prostate without lower urinary tract symptoms: Secondary | ICD-10-CM | POA: Diagnosis present

## 2022-05-30 MED ORDER — GABAPENTIN 300 MG PO CAPS
300.0000 mg | ORAL_CAPSULE | Freq: Every day | ORAL | Status: DC
  Administered 2022-05-30: 300 mg via ORAL
  Filled 2022-05-30: qty 1

## 2022-05-30 NOTE — Progress Notes (Addendum)
     Troy Arellano is a 57 y.o. male   Orthopaedic diagnosis: Status post revision triple arthrodesis left foot, left ankle arthrotomy 05/28/2022   Subjective: Patient reports nerve block wore off completely overnight and into the morning and he has had more pain.  He has required more IV pain medicine today.  He describes burning pain in right lower extremity. He has seen PT/OT has been cleared for discharge home.  He is doing well with regard to transfers and maintaining restrictions. He has had a bowel movement and reports normal bladder function.  He is fearful of discharge home today where he may have uncontrolled pain at home, which occurred during his last surgery that required him to return to the hospital.  He would like to stay 1 more day.  Nursing reports drain output has slowed.  Objectyive: Vitals:   05/29/22 2133 05/30/22 0439  BP: (!) 143/84 134/82  Pulse: 67 71  Resp: 17 18  Temp: 97.8 F (36.6 C) (!) 97.5 F (36.4 C)  SpO2: 100% 95%     Exam: Awake and alert Respirations even and unlabored No acute distress   Left foot and ankle shows well-fitted, clean, dry short leg splint with drain in place.  This was not removed.  He is now able to wiggle the toes and endorses intact sensation to light touch about the toes.  He has good range of motion at the knee.  Exposed skin is benign.  Proximal calf and compartments are soft and nontender. Warm and well-perfused distally.  Capillary refill is brisk.   Assessment: Postop day 2 status post the above, doing well   Plan: We discussed discharge home as he is doing well with regard to his recovery and mobilization postoperatively but is worried about pain control.  Discussed that an increase in pain after nerve block is worn off is typical and not unexpected.  This will improve with time.  He would like to stay 1 more day for pain control.  This is reasonable.  I will add gabapentin 300 mg nightly to his regimen to see if this is  helpful for his burning pain.  Plan for discharge in the a.m.  He will try to wean off IV pain medicine today/tonight.  No changes made to his current narcotic regimen and he is agreeable and understanding of this.   -'325mg'$  aspirin daily for DVT prophylaxis.  He will continue this for 30 days postoperatively. -Pain control as needed.  Plan for oxycodone at discharge -Nonweightbearing left lower extremity. -Keep splint intact. -Empty drain every 4 hours or as needed.  Will pull drain prior to discharge tomorrow.   Plan for outpatient follow-up 2 weeks from surgery with Dr. Lucia Gaskins at [place] for splint removal, suture removal appropriate, and repeat x-rays.  He will be placed into a short leg cast at that time.     Darrel Baroni J. [place], PA-C     Blanca Thornton J. [place], PA-C

## 2022-05-30 NOTE — Progress Notes (Addendum)
Physical Therapy Treatment Patient Details Name: Troy Arellano MRN: [id number] DOB: [birth year] Today's Date: 05/30/2022   History of Present Illness 57 y.o. male presents to [facility] hospital on 05/28/2022 with failure and malalignment of L ankle triple arthrodesis. Pt underwent hardware removal and revision of triple arthrodesis on 1/23. PMH includes OA, BPH, GERD, HTN, HLD, DMII.    PT Comments    Pt requiring supervision transfers and in room ambulation with RW. Pt reporting increased pain today LLE which is limiting mobility. Pt demonstrates good ability to maintain NWB LLE. Reviewed importance of keeping LLE elevated. Pt reports no steps to enter one-level condo, and declining need for stair training.    Recommendations for follow up therapy are one component of a multi-disciplinary discharge planning process, led by the attending physician.  Recommendations may be updated based on patient status, additional functional criteria and insurance authorization.  Follow Up Recommendations  No PT follow up     Assistance Recommended at Discharge Intermittent Supervision/Assistance  Patient can return home with the following A little help with walking and/or transfers;A little help with bathing/dressing/bathroom;Assistance with cooking/housework;Assist for transportation   Equipment Recommendations  Rolling walker (2 wheels);BSC/3in1    Recommendations for Other Services       Precautions / Restrictions Precautions Precautions: Fall Restrictions LLE Weight Bearing: Non weight bearing Other Position/Activity Restrictions: elevate LLE as much as possible     Mobility  Bed Mobility Overal bed mobility: Modified Independent                  Transfers Overall transfer level: Needs assistance Equipment used: Rolling walker (2 wheels) Transfers: Sit to/from Stand Sit to Stand: Supervision           General transfer comment: safe technique, steady     Ambulation/Gait Ambulation/Gait assistance: Supervision Gait Distance (Feet): 15 Feet (x 2)     Gait velocity: decreased     General Gait Details: hop-to gait with RW. Good ability to maintain NWB LLE. Distance limited by pain.   Stairs             Wheelchair Mobility    Modified Rankin (Stroke Patients Only)       Balance Overall balance assessment: Needs assistance Sitting-balance support: Feet supported, No upper extremity supported Sitting balance-Leahy Scale: Good     Standing balance support: Bilateral upper extremity supported, Reliant on assistive device for balance, During functional activity Standing balance-Leahy Scale: Poor Standing balance comment: reliant on RW due to NWB LLE                            Cognition Arousal/Alertness: Awake/alert Behavior During Therapy: WFL for tasks assessed/performed Overall Cognitive Status: Within Functional Limits for tasks assessed                                          Exercises      General Comments General comments (skin integrity, edema, etc.): Pt reports level entry to enter home. Declining need for stair training.      Pertinent Vitals/Pain Pain Assessment Pain Assessment: 0-10 Pain Score: 7  Pain Location: lateral aspect of L foot Pain Descriptors / Indicators: Guarding, Grimacing, Sharp, Burning Pain Intervention(s): Limited activity within patient's tolerance, Patient requesting pain meds-RN notified    Home Living  Prior Function            PT Goals (current goals can now be found in the care plan section) Acute Rehab PT Goals Patient Stated Goal: home Progress towards PT goals: Progressing toward goals    Frequency    Min 5X/week      PT Plan Current plan remains appropriate    Co-evaluation              AM-PAC PT "6 Clicks" Mobility   Outcome Measure  Help needed turning from your back to your  side while in a flat bed without using bedrails?: None Help needed moving from lying on your back to sitting on the side of a flat bed without using bedrails?: None Help needed moving to and from a bed to a chair (including a wheelchair)?: A Little Help needed standing up from a chair using your arms (e.g., wheelchair or bedside chair)?: A Little Help needed to walk in hospital room?: A Little Help needed climbing 3-5 steps with a railing? : A Lot 6 Click Score: 19    End of Session Equipment Utilized During Treatment: Gait belt Activity Tolerance: Patient limited by pain Patient left: in bed;with call bell/phone within reach Nurse Communication: Patient requests pain meds;Mobility status PT Visit Diagnosis: Unsteadiness on feet (R26.81);Difficulty in walking, not elsewhere classified (R26.2);Pain Pain - Right/Left: Left Pain - part of body: Ankle and joints of foot;Leg     Time: 6226-3335 PT Time Calculation (min) (ACUTE ONLY): 10 min  Charges:  $Gait Training: 8-22 mins                     Lorrin Goodell, PT  Office # [phone] Pager [phone]    Lorriane Shire 05/30/2022, 12:11 PM

## 2022-05-30 NOTE — Progress Notes (Cosign Needed)
Durable Medical Equipment  (From admission, onward)           Start     Ordered   05/29/22 1631  For home use only DME Bedside commode  Once       Comments: Confined to one room  Question:  Patient needs a bedside commode to treat with the following condition  Answer:  Fx   05/29/22 1631   05/29/22 1538  For home use only DME Walker rolling  Once       Question Answer Comment  Walker: With [place]   Patient needs a walker to treat with the following condition Gait instability      05/29/22 1538

## 2022-05-30 NOTE — Progress Notes (Signed)
Nutrition Brief Note  Patient identified on the Malnutrition Screening Tool (MST) Report  Pt continues to remain inpatient d/t needing ongoing pain management. He states that despite his pain he continues to eat well. Denies changes to his appetite or PO intake. Observed snacks on his bedside table. He also denies recent weight loss.   Wt Readings from Last 15 Encounters:  05/28/22 99.8 kg  05/09/22 102.2 kg  12/25/21 102.1 kg  10/13/21 99.8 kg  10/10/21 104.1 kg  07/20/21 106.1 kg  05/28/21 102.1 kg  05/24/21 99.8 kg  04/03/21 103 kg  12/28/20 102.1 kg  12/14/20 102.1 kg  10/24/20 102.1 kg  08/22/20 104 kg  08/09/20 102.6 kg  10/11/19 102.6 kg    Body mass index is 34.46 kg/m. Patient meets criteria for obesity based on current BMI.   Current diet order is Regular, patient is consuming approximately 100% of meals at this time. Labs and medications reviewed.   No nutrition interventions warranted at this time. If nutrition issues arise, please consult RD.   Clayborne Dana, RDN, LDN Clinical Nutrition

## 2022-05-30 NOTE — Progress Notes (Signed)
Mobility Specialist Progress Note   05/30/22 1642  Mobility  Activity Ambulated with assistance in hallway  Level of Assistance Contact guard assist, steadying assist  Assistive Device Front wheel walker  Distance Ambulated (ft) 164 ft (82+82)  LLE Weight Bearing NWB  Activity Response Tolerated well  Mobility Referral Yes  $Mobility charge 1 Mobility   Received pt in bed w/ no complaints and agreeable. Pt performing stable hop gait w/ x2 seated rest breaks d/t fatigue. Able to return back to room w/o fault or further complaint. Call bell in reach.  Holland Falling Mobility Specialist Please contact via SecureChat or  Rehab office at [phone]

## 2022-05-31 MED ORDER — OXYCODONE HCL 5 MG PO TABS: ORAL_TABLET | ORAL | 0 refills | Status: DC

## 2022-05-31 MED ORDER — IBUPROFEN 800 MG PO TABS: 800.0000 mg | ORAL_TABLET | Freq: Three times a day (TID) | ORAL | 0 refills | Status: DC | PRN

## 2022-05-31 MED ORDER — ASPIRIN 325 MG PO TABS: ORAL_TABLET | ORAL | 0 refills | Status: AC

## 2022-05-31 MED ORDER — METHOCARBAMOL 500 MG PO TABS: 500.0000 mg | ORAL_TABLET | Freq: Four times a day (QID) | ORAL | 0 refills | Status: DC | PRN

## 2022-05-31 NOTE — Discharge Summary (Signed)
Patient ID: Troy Arellano MRN: [id number] DOB/AGE: [birth year] 57 y.o.  Admit date: 05/28/2022 Discharge date: 05/31/2022  Admission Diagnoses:  Principal Problem:   Left foot pain   Discharge Diagnoses:  Same  Past Medical History:  Diagnosis Date   Arthritis    Back   Benign localized prostatic hyperplasia with lower urinary tract symptoms (LUTS)    urologist--- dr Troy Arellano   Bilateral recurrent inguinal hernia    GERD (gastroesophageal reflux disease)    History of adenomatous polyp of colon    History of COVID-19 05/2020   per pt mild symptoms that resovled   Hyperlipidemia    Hypertension    followed by pcp   Type 2 diabetes mellitus ([place])    followed by pcp   (07-17-2021  per pt does not take check blood sugar )    Surgeries: Procedure(s): DEEP ORTHOPEDIC HARDWARE REMOVAL, LEFT FOOT POSTERIOR, MEDIAL AND LATERAL REVISION TRIPLE ARTHRODESIS on 05/28/2022   Consultants:   Discharged Condition: Improved  Hospital Course: Troy Arellano is an 57 y.o. male who was admitted 05/28/2022 for operative treatment ofLeft foot pain. Patient has severe unremitting pain that affects sleep, daily activities, and work/hobbies. After pre-op clearance the patient was taken to the operating room on 05/28/2022 and underwent  Procedure(s): DEEP ORTHOPEDIC HARDWARE REMOVAL, LEFT FOOT POSTERIOR, MEDIAL AND LATERAL REVISION TRIPLE ARTHRODESIS.    Patient was given perioperative antibiotics:  Anti-infectives (From admission, onward)    Start     Dose/Rate Route Frequency Ordered Stop   05/28/22 1445  ceFAZolin (ANCEF) IVPB 2g/100 mL premix        2 g 200 mL/hr over 30 Minutes Intravenous Every 6 hours 05/28/22 1352 05/29/22 0305   05/28/22 0600  ceFAZolin (ANCEF) IVPB 3g/100 mL premix        3 g 200 mL/hr over 30 Minutes Intravenous On call to O.R. 05/28/22 0549 05/28/22 0745        Patient was given sequential compression devices, early ambulation, and chemoprophylaxis to prevent  DVT.  Patient benefited maximally from hospital stay and there were no complications.    Recent vital signs: Patient Vitals for the past 24 hrs:  BP Temp Temp src Pulse Resp SpO2  05/30/22 2049 (!) 142/80 98.3 F (36.8 C) Oral 73 20 99 %     Recent laboratory studies: No results for input(s): "WBC", "HGB", "HCT", "PLT", "NA", "K", "CL", "CO2", "BUN", "CREATININE", "GLUCOSE", "INR", "CALCIUM" in the last 72 hours.  Invalid input(s): "PT", "2"   Discharge Medications:   Allergies as of 05/31/2022   No Known Allergies      Medication List     STOP taking these medications    traMADol 50 MG tablet Commonly known as: ULTRAM       TAKE these medications    aspirin 325 MG tablet Commonly known as: Bayer Aspirin Take 1 tablet by mouth daily for 30 DAYS for blood clot prevention   hydrochlorothiazide 25 MG tablet Commonly known as: HYDRODIURIL TAKE 1 TABLET BY MOUTH EVERY DAY What changed: when to take this   hydrOXYzine 25 MG capsule Commonly known as: VISTARIL Take 1 capsule (25 mg total) by mouth at bedtime as needed (sleep).   ibuprofen 800 MG tablet Commonly known as: ADVIL Take 1 tablet (800 mg total) by mouth every 8 (eight) hours as needed for moderate pain or mild pain. What changed: when to take this   metFORMIN 500 MG tablet Commonly known as: GLUCOPHAGE Take 1 tablet (500 mg total)  by mouth daily with breakfast.   methocarbamol 500 MG tablet Commonly known as: ROBAXIN Take 1 tablet (500 mg total) by mouth every 6 (six) hours as needed for muscle spasms.   oxyCODONE 5 MG immediate release tablet Commonly known as: Roxicodone Take 1-2 tablets by mouth every 4-6 hours as needed for post op pain               Durable Medical Equipment  (From admission, onward)           Start     Ordered   05/29/22 1631  For home use only DME Bedside commode  Once       Comments: Confined to one room  Question:  Patient needs a bedside commode to treat  with the following condition  Answer:  Fx   05/29/22 1631   05/29/22 1538  For home use only DME Walker rolling  Once       Question Answer Comment  Walker: With [place]   Patient needs a walker to treat with the following condition Gait instability      05/29/22 1538              Discharge Care Instructions  (From admission, onward)           Start     Ordered   05/31/22 0000  Non weight bearing       Question Answer Comment  Laterality left   Extremity Lower      05/31/22 0842            Diagnostic Studies: DG Ankle Complete Left  Result Date: 05/28/2022 CLINICAL DATA:  Hardware removal. EXAM: LEFT ANKLE COMPLETE - 3+ VIEW COMPARISON:  CT left foot dated April 25, 2022. FLUOROSCOPY TIME:  Radiation Exposure Index (as provided by the fluoroscopic device): 1.59 mGy Kerma C-arm fluoroscopic images were obtained intraoperatively and submitted for post operative interpretation. FINDINGS: Multiple intraoperative fluoroscopic images demonstrate interval revision of the subtalar, talonavicular, and calcaneocuboid fusion. No acute osseous abnormality. IMPRESSION: 1. Intraoperative fluoroscopic guidance for triple arthrodesis revision. Electronically Signed   By: Titus Dubin M.D.   On: 05/28/2022 11:26   DG C-Arm 1-60 Min-No Report  Result Date: 05/28/2022 Fluoroscopy was utilized by the requesting physician.  No radiographic interpretation.   DG C-Arm 1-60 Min-No Report  Result Date: 05/28/2022 Fluoroscopy was utilized by the requesting physician.  No radiographic interpretation.   DG C-Arm 1-60 Min-No Report  Result Date: 05/28/2022 Fluoroscopy was utilized by the requesting physician.  No radiographic interpretation.    Disposition: Discharge disposition: 01-Home or Self Care     Plan: Patient amenable to discharge home this afternoon.  May continue to work with physical therapy today prior to discharge.  We will stop his IV pain medicine.  JP drain  was pulled uneventfully.  -'325mg'$  aspirin daily x 30 days for DVT prophylaxis.  Sent to pharmacy. -Pain control as needed.  Oxycodone and methocarbamol sent to pharmacy. -Nonweightbearing left lower extremity with walker  -Keep splint intact.   Plan for outpatient follow-up 2 weeks from surgery with Dr. Lucia Gaskins at [place] for splint removal, suture removal appropriate, and repeat x-rays.  He will be placed into a short leg cast at that time.  Discharge Instructions     Call MD / Call 911   Complete by: As directed    If you experience chest pain or shortness of breath, CALL 911 and be transported to the hospital emergency room.  If you develope a fever above 101 F, pus (white drainage) or increased drainage or redness at the wound, or calf pain, call your surgeon's office.   Constipation Prevention   Complete by: As directed    Drink plenty of fluids.  Prune juice may be helpful.  You may use a stool softener, such as Colace (over the counter) 100 mg twice a day.  Use MiraLax (over the counter) for constipation as needed.   Diet - low sodium heart healthy   Complete by: As directed    Non weight bearing   Complete by: As directed    Laterality: left   Extremity: Lower   Post-operative opioid taper instructions:   Complete by: As directed    POST-OPERATIVE OPIOID TAPER INSTRUCTIONS: It is important to wean off of your opioid medication as soon as possible. If you do not need pain medication after your surgery it is ok to stop day one. Opioids include: Codeine, Hydrocodone(Norco, Vicodin), Oxycodone(Percocet, oxycontin) and hydromorphone amongst others.  Long term and even short term use of opiods can cause: Increased pain response Dependence Constipation Depression Respiratory depression And more.  Withdrawal symptoms can include Flu like symptoms Nausea, vomiting And more Techniques to manage these symptoms Hydrate well Eat regular healthy meals Stay active Use  relaxation techniques(deep breathing, meditating, yoga) Do Not substitute Alcohol to help with tapering If you have been on opioids for less than two weeks and do not have pain than it is ok to stop all together.  Plan to wean off of opioids This plan should start within one week post op of your joint replacement. Maintain the same interval or time between taking each dose and first decrease the dose.  Cut the total daily intake of opioids by one tablet each day Next start to increase the time between doses. The last dose that should be eliminated is the evening dose.      Walker rolling   Complete by: As directed         Follow-up Information     [place], [place], [place] Follow up.   Specialty: Physician Assistant Contact information: [place] [address] [place], [place] Follow up.   Specialty: [place] Why: home health PT,RN services will be provided by [facility] , start of care within 48 hours post discharge Contact information: [address] [place] [address] [phone]                  Signed: Lidia Clavijo J [place] 05/31/2022, 8:43 AM

## 2022-05-31 NOTE — Progress Notes (Signed)
     Troy Arellano is a 57 y.o. male   Orthopaedic diagnosis: Status post revision triple arthrodesis left foot, left ankle arthrotomy 05/28/2022   Subjective: Patient doing well this morning.  No overnight events.  Pain well-controlled.  Burning pain in the lower leg has improved.  Not not sure the gabapentin at night made a difference.  He is hopeful for discharge home today around 4-5pm when his wife can get him.  Drain output has slowed considerably.  Mobilizing well.  No fevers, shortness of breath, or chest pain.  Reports normal bowel and bladder function.  Objectyive: Vitals:   05/30/22 0439 05/30/22 2049  BP: 134/82 (!) 142/80  Pulse: 71 73  Resp: 18 20  Temp: (!) 97.5 F (36.4 C) 98.3 F (36.8 C)  SpO2: 95% 99%     Exam: Awake and alert Respirations even and unlabored No acute distress  Examination of the left foot and ankle shows well-fitted, clean, and dry short leg splint in place.  The splint was not removed.  The drain was in place with minimal output.  This was removed uneventfully.  Patient tolerated this well.  He maintains good range of motion and sensation in the toes.  Mild appropriate swelling in the toes.  Exposed skin is benign.  Proximal calf and compartments are soft and nontender.  Warm and well-perfused distally.  Capillary refill is brisk.  Assessment: Postop day 3 status post the above, doing well  Plan: Patient amenable to discharge home this afternoon.  May continue to work with physical therapy today prior to discharge.  We will stop his IV pain medicine.  JP drain was pulled uneventfully.  -'325mg'$  aspirin daily x 30 days for DVT prophylaxis.  Sent to pharmacy. -Pain control as needed.  Oxycodone and methocarbamol sent to pharmacy. -Nonweightbearing left lower extremity with walker  -Keep splint intact.   Plan for outpatient follow-up 2 weeks from surgery with Dr. Lucia Gaskins at [place] for splint removal, suture removal appropriate, and repeat  x-rays.  He will be placed into a short leg cast at that time.      Briseyda Fehr J. [place], PA-C

## 2022-05-31 NOTE — Progress Notes (Signed)
Nursing Dc note  Patient alert and oriented, verbalized understanding of instructions. Patient going home via cab. A family member name Jeanella Cara, 57 years old ,confirmed via phone that she will be there once patient arrive home.All belongings and equipments given to patient.

## 2022-05-31 NOTE — TOC Transition Note (Signed)
Transition of Care [facility]) - CM/SW Discharge Note   Patient Details  Name: Troy Arellano MRN: [id number] Date of Birth: Mar 07, 1966  [facility] of [facility] CM/SW Contact:  Sharin Mons, RN Phone Number: 05/31/2022, 10:32 AM   Clinical Narrative:    Patient will DC to: home Anticipated DC date: 05/31/2022 Family notified: yes Transport by: car      - s/p underwent hardware removal and revision of triple arthrodesis on 1/23      Per MD patient ready for DC today . RN, patient, patient's family, and [facility] notified of DC. Wife /family to assist with care post d/c. Post hospital f/u noted on AVS. Pt without RX med concerns.  Pt without transportation to home. Taxi voucher given per [facility] department. Pt states a family member will receive him @ home.  RNCM will sign off for now as intervention is no longer needed. Please consult [place] again if new needs arise.    Final next level of care: [place] Barriers to Discharge: No Barriers Identified   Patient Goals and CMS Choice   Choice offered to / list presented to : Patient  Discharge Placement                         Discharge Plan and Services Additional resources added to the After Visit Summary for                  DME Arranged: Bedside commode DME Agency: [employer] Date DME Agency Contacted: 05/29/22 Time DME Agency Contacted: [phone] Representative spoke with at DME Agency: [place]: PT, RN [facility] Agency: [place] Date [place]: 05/29/22 Time [place]: [phone] Representative spoke with at [place]: [place] Determinants of Health ([place]) Interventions SDOH Screenings   Food Insecurity: No Food Insecurity (05/28/2022)  Transportation Needs: No Transportation Needs (05/28/2022)  Utilities: Not At Risk (05/28/2022)  Depression (PHQ2-9): Low Risk  (10/10/2021)  Tobacco Use: Low Risk  (05/28/2022)     Readmission Risk Interventions     12/27/2021    8:16 AM  Readmission Risk Prevention Plan  Post Dischage Appt Complete  Medication Screening Complete  Transportation Screening Complete

## 2022-06-01 ENCOUNTER — Other Ambulatory Visit: Payer: Self-pay | Admitting: Physician Assistant

## 2022-06-02 ENCOUNTER — Encounter (HOSPITAL_COMMUNITY): Payer: Self-pay | Admitting: Orthopaedic Surgery

## 2022-06-03 ENCOUNTER — Telehealth: Payer: Self-pay

## 2022-06-03 NOTE — Telephone Encounter (Signed)
Pt called back and would like an update on the referral

## 2022-06-03 NOTE — Telephone Encounter (Signed)
Transition Care Management Unsuccessful Follow-up Telephone Call  Date of discharge and from where:  [place] 05-31-22 Dx: left foot pain   Attempts:  1st Attempt  Reason for unsuccessful TCM follow-up call:  Missing or invalid number   [place] Direct Dial [phone]

## 2022-06-04 NOTE — Telephone Encounter (Signed)
Transition Care Management Unsuccessful Follow-up Telephone Call  Date of discharge and from where:    [place] 05-31-22 Dx: left foot pain    Attempts:  2nd Attempt  Reason for unsuccessful TCM follow-up call:  Left voice message   Juanda Crumble LPN [place] Direct Dial [phone]

## 2022-06-05 NOTE — Telephone Encounter (Signed)
Transition Care Management Unsuccessful Follow-up Telephone Call  Date of discharge and from where:    [place] 05-31-22 Dx: left foot pain      Attempts:  3rd Attempt  Reason for unsuccessful TCM follow-up call:  Left voice message   Juanda Crumble LPN [place] Direct Dial [phone]

## 2022-06-06 ENCOUNTER — Other Ambulatory Visit: Payer: Self-pay | Admitting: Podiatry

## 2022-06-15 ENCOUNTER — Emergency Department (HOSPITAL_COMMUNITY): Payer: BC Managed Care – PPO

## 2022-06-15 ENCOUNTER — Encounter (HOSPITAL_COMMUNITY): Payer: Self-pay

## 2022-06-15 ENCOUNTER — Other Ambulatory Visit: Payer: Self-pay

## 2022-06-15 ENCOUNTER — Inpatient Hospital Stay (HOSPITAL_COMMUNITY)
Admission: EM | Admit: 2022-06-15 | Discharge: 2022-06-18 | DRG: 872 | Disposition: A | Discharge status: Home / Self Care | Payer: BC Managed Care – PPO | Attending: Student | Admitting: Student

## 2022-06-15 DIAGNOSIS — I10 Essential (primary) hypertension: Secondary | ICD-10-CM | POA: Diagnosis not present

## 2022-06-15 DIAGNOSIS — R7303 Prediabetes: Secondary | ICD-10-CM

## 2022-06-15 DIAGNOSIS — N400 Enlarged prostate without lower urinary tract symptoms: Secondary | ICD-10-CM | POA: Diagnosis present

## 2022-06-15 DIAGNOSIS — E669 Obesity, unspecified: Secondary | ICD-10-CM | POA: Diagnosis present

## 2022-06-15 DIAGNOSIS — A419 Sepsis, unspecified organism: Secondary | ICD-10-CM | POA: Diagnosis not present

## 2022-06-15 DIAGNOSIS — N390 Urinary tract infection, site not specified: Secondary | ICD-10-CM

## 2022-06-15 DIAGNOSIS — Z6834 Body mass index (BMI) 34.0-34.9, adult: Secondary | ICD-10-CM

## 2022-06-15 DIAGNOSIS — E119 Type 2 diabetes mellitus without complications: Secondary | ICD-10-CM | POA: Diagnosis present

## 2022-06-15 DIAGNOSIS — E785 Hyperlipidemia, unspecified: Secondary | ICD-10-CM | POA: Diagnosis present

## 2022-06-15 DIAGNOSIS — Z9889 Other specified postprocedural states: Secondary | ICD-10-CM

## 2022-06-15 DIAGNOSIS — T502X5A Adverse effect of carbonic-anhydrase inhibitors, benzothiadiazides and other diuretics, initial encounter: Secondary | ICD-10-CM | POA: Diagnosis not present

## 2022-06-15 DIAGNOSIS — R52 Pain, unspecified: Secondary | ICD-10-CM | POA: Diagnosis not present

## 2022-06-15 DIAGNOSIS — M7989 Other specified soft tissue disorders: Secondary | ICD-10-CM | POA: Diagnosis not present

## 2022-06-15 DIAGNOSIS — N3000 Acute cystitis without hematuria: Secondary | ICD-10-CM | POA: Diagnosis not present

## 2022-06-15 DIAGNOSIS — Z7984 Long term (current) use of oral hypoglycemic drugs: Secondary | ICD-10-CM

## 2022-06-15 DIAGNOSIS — Z8249 Family history of ischemic heart disease and other diseases of the circulatory system: Secondary | ICD-10-CM

## 2022-06-15 DIAGNOSIS — K219 Gastro-esophageal reflux disease without esophagitis: Secondary | ICD-10-CM | POA: Diagnosis present

## 2022-06-15 DIAGNOSIS — A4151 Sepsis due to Escherichia coli [E. coli]: Principal | ICD-10-CM | POA: Diagnosis present

## 2022-06-15 DIAGNOSIS — E876 Hypokalemia: Secondary | ICD-10-CM | POA: Diagnosis present

## 2022-06-15 DIAGNOSIS — E871 Hypo-osmolality and hyponatremia: Secondary | ICD-10-CM | POA: Diagnosis present

## 2022-06-15 DIAGNOSIS — B9620 Unspecified Escherichia coli [E. coli] as the cause of diseases classified elsewhere: Secondary | ICD-10-CM | POA: Insufficient documentation

## 2022-06-15 DIAGNOSIS — Z807 Family history of other malignant neoplasms of lymphoid, hematopoietic and related tissues: Secondary | ICD-10-CM

## 2022-06-15 DIAGNOSIS — R6520 Severe sepsis without septic shock: Secondary | ICD-10-CM | POA: Diagnosis present

## 2022-06-15 DIAGNOSIS — Z8616 Personal history of COVID-19: Secondary | ICD-10-CM

## 2022-06-15 DIAGNOSIS — Z1152 Encounter for screening for COVID-19: Secondary | ICD-10-CM

## 2022-06-15 DIAGNOSIS — Z79899 Other long term (current) drug therapy: Secondary | ICD-10-CM

## 2022-06-15 LAB — CBC WITH DIFFERENTIAL/PLATELET
Abs Immature Granulocytes: 0.06 10*3/uL (ref 0.00–0.07)
Basophils Absolute: 0 10*3/uL (ref 0.0–0.1)
Basophils Relative: 0 %
Eosinophils Absolute: 0 10*3/uL (ref 0.0–0.5)
Eosinophils Relative: 0 %
HCT: 37.8 % — ABNORMAL LOW (ref 39.0–52.0)
Hemoglobin: 13 g/dL (ref 13.0–17.0)
Immature Granulocytes: 1 %
Lymphocytes Relative: 10 %
Lymphs Abs: 1.1 10*3/uL (ref 0.7–4.0)
MCH: 28.9 pg (ref 26.0–34.0)
MCHC: 34.4 g/dL (ref 30.0–36.0)
MCV: 84 fL (ref 80.0–100.0)
Monocytes Absolute: 0.5 10*3/uL (ref 0.1–1.0)
Monocytes Relative: 5 %
Neutro Abs: 9.4 10*3/uL — ABNORMAL HIGH (ref 1.7–7.7)
Neutrophils Relative %: 84 %
Platelets: 257 10*3/uL (ref 150–400)
RBC: 4.5 MIL/uL (ref 4.22–5.81)
RDW: 13.3 % (ref 11.5–15.5)
WBC: 11 10*3/uL — ABNORMAL HIGH (ref 4.0–10.5)
nRBC: 0 % (ref 0.0–0.2)

## 2022-06-15 LAB — COMPREHENSIVE METABOLIC PANEL
ALT: 26 U/L (ref 0–44)
AST: 27 U/L (ref 15–41)
Albumin: 3.4 g/dL — ABNORMAL LOW (ref 3.5–5.0)
Alkaline Phosphatase: 68 U/L (ref 38–126)
Anion gap: 11 (ref 5–15)
BUN: 8 mg/dL (ref 6–20)
CO2: 21 mmol/L — ABNORMAL LOW (ref 22–32)
Calcium: 9.1 mg/dL (ref 8.9–10.3)
Chloride: 101 mmol/L (ref 98–111)
Creatinine, Ser: 0.98 mg/dL (ref 0.61–1.24)
GFR, Estimated: >60 mL/min (ref >60)
Glucose, Bld: 115 mg/dL — ABNORMAL HIGH (ref 70–99)
Potassium: 3.4 mmol/L — ABNORMAL LOW (ref 3.5–5.1)
Sodium: 133 mmol/L — ABNORMAL LOW (ref 135–145)
Total Bilirubin: 0.7 mg/dL (ref 0.3–1.2)
Total Protein: 8 g/dL (ref 6.5–8.1)

## 2022-06-15 LAB — URINALYSIS, ROUTINE W REFLEX MICROSCOPIC
Bilirubin Urine: NEGATIVE
Glucose, UA: NEGATIVE mg/dL
Ketones, ur: NEGATIVE mg/dL
Nitrite: POSITIVE — AB
Protein, ur: 30 mg/dL — AB
RBC / HPF: >50 RBC/hpf (ref 0–5)
Specific Gravity, Urine: 1.017 (ref 1.005–1.030)
WBC, UA: >50 WBC/hpf (ref 0–5)
pH: 7 (ref 5.0–8.0)

## 2022-06-15 LAB — LACTIC ACID, PLASMA
Lactic Acid, Venous: 3 mmol/L (ref 0.5–1.9)
Lactic Acid, Venous: 2.3 mmol/L (ref 0.5–1.9)
Lactic Acid, Venous: 1.7 mmol/L (ref 0.5–1.9)

## 2022-06-15 LAB — RESP PANEL BY RT-PCR (RSV, FLU A&B, COVID)  RVPGX2
Influenza A by PCR: NEGATIVE
Influenza B by PCR: NEGATIVE
Resp Syncytial Virus by PCR: NEGATIVE
SARS Coronavirus 2 by RT PCR: NEGATIVE

## 2022-06-15 LAB — CBG MONITORING, ED: Glucose-Capillary: 116 mg/dL — ABNORMAL HIGH (ref 70–99)

## 2022-06-15 LAB — PROTIME-INR
INR: 1 (ref 0.8–1.2)
Prothrombin Time: 13.4 seconds (ref 11.4–15.2)

## 2022-06-15 MED ORDER — SODIUM CHLORIDE 0.9 % IV SOLN
2.0000 g | Freq: Once | INTRAVENOUS | Status: AC
  Administered 2022-06-15: 2 g via INTRAVENOUS
  Filled 2022-06-15: qty 20

## 2022-06-15 MED ORDER — LACTATED RINGERS IV SOLN: INTRAVENOUS | Status: DC

## 2022-06-15 MED ORDER — OXYCODONE HCL 5 MG PO TABS
5.0000 mg | ORAL_TABLET | ORAL | Status: DC | PRN
  Administered 2022-06-16 – 2022-06-18 (×4): 5 mg via ORAL
  Filled 2022-06-15 (×4): qty 1

## 2022-06-15 MED ORDER — ASPIRIN 325 MG PO TABS: 325.0000 mg | ORAL_TABLET | Freq: Every day | ORAL | Status: DC

## 2022-06-15 MED ORDER — SODIUM CHLORIDE 0.9 % IV BOLUS
1000.0000 mL | Freq: Once | INTRAVENOUS | Status: AC
  Administered 2022-06-15: 1000 mL via INTRAVENOUS

## 2022-06-15 MED ORDER — SODIUM CHLORIDE 0.9 % IV SOLN: Freq: Once | INTRAVENOUS | Status: AC

## 2022-06-15 MED ORDER — FENTANYL CITRATE PF 50 MCG/ML IJ SOSY
50.0000 ug | PREFILLED_SYRINGE | Freq: Once | INTRAMUSCULAR | Status: AC
  Administered 2022-06-15: 50 ug via INTRAVENOUS
  Filled 2022-06-15: qty 1

## 2022-06-15 MED ORDER — MORPHINE SULFATE (PF) 4 MG/ML IV SOLN
4.0000 mg | Freq: Once | INTRAVENOUS | Status: AC
  Administered 2022-06-15: 4 mg via INTRAVENOUS
  Filled 2022-06-15: qty 1

## 2022-06-15 MED ORDER — ACETAMINOPHEN 500 MG PO TABS
ORAL_TABLET | ORAL | Status: AC
  Administered 2022-06-15: 1000 mg via ORAL
  Filled 2022-06-15: qty 2

## 2022-06-15 MED ORDER — ACETAMINOPHEN 500 MG PO TABS: 1000.0000 mg | ORAL_TABLET | Freq: Once | ORAL | Status: AC

## 2022-06-15 MED ORDER — METHOCARBAMOL 500 MG PO TABS
500.0000 mg | ORAL_TABLET | Freq: Four times a day (QID) | ORAL | Status: DC | PRN
  Administered 2022-06-16: 500 mg via ORAL
  Filled 2022-06-15: qty 1

## 2022-06-15 MED ORDER — ACETAMINOPHEN 325 MG PO TABS
650.0000 mg | ORAL_TABLET | Freq: Four times a day (QID) | ORAL | Status: DC | PRN
  Administered 2022-06-15 – 2022-06-16 (×3): 650 mg via ORAL
  Filled 2022-06-15 (×3): qty 2

## 2022-06-15 MED ORDER — SODIUM CHLORIDE 0.9 % IV SOLN: 1.0000 g | INTRAVENOUS | Status: DC

## 2022-06-15 MED ORDER — ENOXAPARIN SODIUM 40 MG/0.4ML IJ SOSY
40.0000 mg | PREFILLED_SYRINGE | INTRAMUSCULAR | Status: DC
  Administered 2022-06-16 – 2022-06-17 (×3): 40 mg via SUBCUTANEOUS
  Filled 2022-06-15 (×3): qty 0.4

## 2022-06-15 MED ORDER — IBUPROFEN 200 MG PO TABS
800.0000 mg | ORAL_TABLET | Freq: Four times a day (QID) | ORAL | Status: DC | PRN
  Administered 2022-06-15: 800 mg via ORAL
  Filled 2022-06-15: qty 1

## 2022-06-15 MED ORDER — ACETAMINOPHEN 325 MG PO TABS: 650.0000 mg | ORAL_TABLET | Freq: Four times a day (QID) | ORAL | Status: DC | PRN

## 2022-06-15 NOTE — Assessment & Plan Note (Signed)
-  continue home antihypertensive

## 2022-06-15 NOTE — ED Notes (Signed)
Ortho at bedside removing pt cast.

## 2022-06-15 NOTE — ED Provider Notes (Signed)
[place] Provider Note   CSN: [id number] Arrival date & time: 06/15/22  1641     History  Chief Complaint  Patient presents with   Leg Pain    Troy Arellano is a 57 y.o. male.  Patient here with fever and chills since this morning.  He had recent left ankle surgery about 2 and half weeks ago.  He had a new cast placed last week.  Overall the surgical site was healing well per patient and family.  Body aches and chills and fever this morning.  He has ongoing left foot pain/ankle pain at his cast site.  Patient denies any cough or sputum production.  Denies any nausea vomiting diarrhea.  He has been having frequent urination.  History of diabetes.  Denies any abdominal pain.  The history is provided by the patient.       Home Medications Prior to Admission medications   Medication Sig Start Date End Date Taking? Authorizing Provider  aspirin (BAYER ASPIRIN) 325 MG tablet Take 1 tablet by mouth daily for 30 DAYS for blood clot prevention 05/31/22   Martin[place], Jesse J, PA-C  hydrochlorothiazide (HYDRODIURIL) 25 MG tablet TAKE 1 TABLET BY MOUTH EVERY DAY Patient taking differently: Take 25 mg by mouth every morning. 12/13/21   [name], PA  hydrOXYzine (VISTARIL) 25 MG capsule TAKE 1 CAPSULE (25 MG TOTAL) BY MOUTH AT BEDTIME AS NEEDED (SLEEP). 06/03/22   [name], PA  ibuprofen (ADVIL) 800 MG tablet TAKE 1 TABLET BY MOUTH EVERY 6 HOURS AS NEEDED. 06/13/22   Felipa Furnace, DPM  metFORMIN (GLUCOPHAGE) 500 MG tablet Take 1 tablet (500 mg total) by mouth daily with breakfast. 08/23/20   [name], PA  methocarbamol (ROBAXIN) 500 MG tablet Take 1 tablet (500 mg total) by mouth every 6 (six) hours as needed for muscle spasms. 05/31/22   [place], Jesse J, PA-C  oxyCODONE (ROXICODONE) 5 MG immediate release tablet Take 1-2 tablets by mouth every 4-6 hours as needed for post op pain 05/31/22   [place], Jesse J, PA-C      Allergies     Patient has no known allergies.    Review of Systems   Review of Systems  Physical Exam Updated Vital Signs BP (!) 143/77   Pulse 96   Temp (!) 103.1 F (39.5 C) (Oral)   Resp (!) 24   Ht 5' 7"$  (1.702 m)   Wt 99.8 kg   SpO2 100%   BMI 34.46 kg/m  Physical Exam Vitals and nursing note reviewed.  Constitutional:      General: He is not in acute distress.    Appearance: He is well-developed. He is ill-appearing.  HENT:     Head: Normocephalic and atraumatic.     Nose: Nose normal.     Mouth/Throat:     Mouth: Mucous membranes are moist.  Eyes:     Extraocular Movements: Extraocular movements intact.     Conjunctiva/sclera: Conjunctivae normal.     Pupils: Pupils are equal, round, and reactive to light.  Cardiovascular:     Rate and Rhythm: Regular rhythm. Tachycardia present.     Pulses: Normal pulses.     Heart sounds: Normal heart sounds. No murmur heard. Pulmonary:     Effort: Pulmonary effort is normal. No respiratory distress.     Breath sounds: Normal breath sounds.  Abdominal:     Palpations: Abdomen is soft.     Tenderness: There is no abdominal  tenderness.  Musculoskeletal:        General: No swelling. Normal range of motion.     Cervical back: Normal range of motion and neck supple.  Skin:    General: Skin is warm and dry.     Capillary Refill: Capillary refill takes less than 2 seconds.  Neurological:     General: No focal deficit present.     Mental Status: He is alert and oriented to person, place, and time.     Cranial Nerves: No cranial nerve deficit.     Sensory: No sensory deficit.     Motor: No weakness.     Coordination: Coordination normal.  Psychiatric:        Mood and Affect: Mood normal.     ED Results / Procedures / Treatments   Labs (all labs ordered are listed, but only abnormal results are displayed) Labs Reviewed  COMPREHENSIVE METABOLIC PANEL - Abnormal; Notable for the following components:      Result Value   Sodium 133  (*)    Potassium 3.4 (*)    CO2 21 (*)    Glucose, Bld 115 (*)    Albumin 3.4 (*)    All other components within normal limits  LACTIC ACID, PLASMA - Abnormal; Notable for the following components:   Lactic Acid, Venous 3.0 (*)    All other components within normal limits  LACTIC ACID, PLASMA - Abnormal; Notable for the following components:   Lactic Acid, Venous 2.3 (*)    All other components within normal limits  CBC WITH DIFFERENTIAL/PLATELET - Abnormal; Notable for the following components:   WBC 11.0 (*)    HCT 37.8 (*)    Neutro Abs 9.4 (*)    All other components within normal limits  URINALYSIS, ROUTINE W REFLEX MICROSCOPIC - Abnormal; Notable for the following components:   APPearance HAZY (*)    Hgb urine dipstick MODERATE (*)    Protein, ur 30 (*)    Nitrite POSITIVE (*)    Leukocytes,Ua LARGE (*)    Bacteria, UA RARE (*)    All other components within normal limits  CBG MONITORING, ED - Abnormal; Notable for the following components:   Glucose-Capillary 116 (*)    All other components within normal limits  CULTURE, BLOOD (ROUTINE X 2)  CULTURE, BLOOD (ROUTINE X 2)  RESP PANEL BY RT-PCR (RSV, FLU A&B, COVID)  RVPGX2  PROTIME-INR    EKG None  Radiology VAS [place] LOWER EXTREMITY VENOUS (DVT)  Result Date: 06/15/2022  Lower Venous DVT Study Patient Name:  Troy Arellano  Date of Exam:   06/15/2022 Medical Rec #: [id number]    Accession #:    [id number] Date of Birth: 07/08/1965    Patient Gender: M Patient Age:   56 years Exam Location:  [facility] Procedure:      VAS [place] LOWER EXTREMITY VENOUS (DVT) Referring Phys: Randye Treichler --------------------------------------------------------------------------------  Indications: Pain, and Swelling.  Comparison Study: No prior study on file Performing Technologist: Sharion Dove RVS  Examination Guidelines: A complete evaluation includes B-mode imaging, spectral Doppler, color Doppler, and power Doppler as needed of all  accessible portions of each vessel. Bilateral testing is considered an integral part of a complete examination. Limited examinations for reoccurring indications may be performed as noted. The reflux portion of the exam is performed with the patient in reverse Trendelenburg.  +-----+---------------+---------+-----------+----------+--------------+ RIGHTCompressibilityPhasicitySpontaneityPropertiesThrombus Aging +-----+---------------+---------+-----------+----------+--------------+ CFV  Full           Yes  Yes                                 +-----+---------------+---------+-----------+----------+--------------+ SFJ  Full                                                        +-----+---------------+---------+-----------+----------+--------------+   +---------+---------------+---------+-----------+----------+--------------+ LEFT     CompressibilityPhasicitySpontaneityPropertiesThrombus Aging +---------+---------------+---------+-----------+----------+--------------+ CFV      Full           Yes      Yes                                 +---------+---------------+---------+-----------+----------+--------------+ SFJ      Full                                                        +---------+---------------+---------+-----------+----------+--------------+ FV Prox  Full                                                        +---------+---------------+---------+-----------+----------+--------------+ FV Mid   Full                                                        +---------+---------------+---------+-----------+----------+--------------+ FV DistalFull                                                        +---------+---------------+---------+-----------+----------+--------------+ PFV      Full                                                        +---------+---------------+---------+-----------+----------+--------------+ POP      Full            Yes      Yes                                 +---------+---------------+---------+-----------+----------+--------------+ PTV      Full                                                        +---------+---------------+---------+-----------+----------+--------------+ PERO     Full                                                        +---------+---------------+---------+-----------+----------+--------------+  Summary: RIGHT: - No evidence of common femoral vein obstruction.  LEFT: - There is no evidence of deep vein thrombosis in the lower extremity.  - No cystic structure found in the popliteal fossa.  *See table(s) above for measurements and observations.    Preliminary    DG Chest 2 View  Result Date: 06/15/2022 CLINICAL DATA:  Suspected sepsis. EXAM: CHEST - 2 VIEW COMPARISON:  05/24/2021. FINDINGS: Cardiac silhouette normal in size. Normal mediastinal and hilar contours. Clear lungs.  No convincing pleural effusion or pneumothorax. Skeletal structures are grossly intact. IMPRESSION: No active cardiopulmonary disease. Electronically Signed   By: Lajean Manes M.D.   On: 06/15/2022 17:42    Procedures .Critical Care  Performed by: Lennice Sites, DO Authorized by: Lennice Sites, DO   Critical care provider statement:    Critical care time (minutes):  35   Critical care was necessary to treat or prevent imminent or life-threatening deterioration of the following conditions:  Sepsis   Critical care was time spent personally by me on the following activities:  Blood draw for specimens, development of treatment plan with patient or surrogate, discussions with primary provider, evaluation of patient's response to treatment, examination of patient, obtaining history from patient or surrogate, ordering and performing treatments and interventions, ordering and review of laboratory studies, ordering and review of radiographic studies, pulse oximetry, re-evaluation of patient's condition  and review of old charts   I assumed direction of critical care for this patient from another provider in my specialty: no     Care discussed with: admitting provider       Medications Ordered in ED Medications  sodium chloride 0.9 % bolus 1,000 mL (has no administration in time range)  cefTRIAXone (ROCEPHIN) 2 g in sodium chloride 0.9 % 100 mL IVPB (has no administration in time range)  0.9 %  sodium chloride infusion (has no administration in time range)  acetaminophen (TYLENOL) tablet 1,000 mg (1,000 mg Oral Given 06/15/22 1704)  sodium chloride 0.9 % bolus 1,000 mL (1,000 mLs Intravenous New Bag/Given 06/15/22 1824)  fentaNYL (SUBLIMAZE) injection 50 mcg (50 mcg Intravenous Given 06/15/22 1823)    ED Course/ Medical Decision Making/ A&P                             Medical Decision Making Amount and/or Complexity of Data Reviewed Labs: ordered. Radiology: ordered.  Risk OTC drugs. Prescription drug management. Decision regarding hospitalization.   Devaunte Zambrana is here with fever.  Patient with fever tachycardia but otherwise normal vitals.  He is well-appearing.  Flulike symptoms.  He had surgery to his left ankle couple weeks ago.  I talked with Dr. Lucia Gaskins orthopedics and he was okay with me removing his cast to take a look at surgical site.  Surgical site overall appears clean dry and intact.  There is no purulent drainage.  There is some swelling but I expect that this is expected given his recent surgery.  Overall seems less likely to be an infection at his surgical site.  Will pursue infectious workup with urine studies, COVID and flu testing, chest x-ray and labs and blood cultures.  He is not immunocompromise.  Talked with Dr. Lucia Gaskins and overall likely will pursue any further workup of possible infection of the ankle and foot is overall clinically appears well.  However they will follow-up closely for recheck of the ankle.  Will continue to pursue other infectious process.  Sounds  like he has mostly viral symptoms.  He has had some increased urination and frequent urination and could be UTI.  Per my review and interpretation of labs he does have a lactic acid of 3, white count of 11.  Urinalysis does appear to be consistent with infection.  Overall he does meet sepsis criteria with fever and tachycardia no source for UTI, does have lactic acidosis.  They could be reasonable to admit him for further sepsis workup.  IV antibiotics have been started.  Per my further review and interpretation of labs there is no significant anemia or electrolyte abnormality.  DVT study is unremarkable.  This chart was dictated using voice recognition software.  Despite best efforts to proofread,  errors can occur which can change the documentation meaning.         Final Clinical Impression(s) / ED Diagnoses Final diagnoses:  Sepsis, due to unspecified organism, unspecified whether acute organ dysfunction present [facility])  Acute cystitis without hematuria    Rx / DC Orders ED Discharge Orders     None         Lennice Sites, DO 06/15/22 1937

## 2022-06-15 NOTE — Assessment & Plan Note (Addendum)
-  Recent left foot revision triple arthrodesis with hardware removal on 1/24 with Dr. Lucia Gaskins.    -negative bilateral venous Doppler ultrasound today -Appears to be healing well with no signs of infection from that stand point. Dr. Lucia Gaskins recommends placing him back on short leg splint.  -Continue follow-up outpatient with orthopedics

## 2022-06-15 NOTE — Assessment & Plan Note (Signed)
BMI of 34

## 2022-06-15 NOTE — H&P (Signed)
History and Physical    Patient: Troy Arellano [id number] DOB: 09/08/1965 DOA: 06/15/2022 DOS: the patient was seen and examined on 06/15/2022 PCP: [name], PA  Patient coming from: Home  Chief Complaint:  Chief Complaint  Patient presents with   Leg Pain   Urinary Frequency   HPI: Troy Arellano is a 57 y.o. male with medical history significant of HTN, HLD, pre-diabetes, recent left foot revision triple arthrodesis who presents with chills and increase urinary frequency.   Symptoms started earlier today with fever. He also recently had left foot revision triple arthrodesis on 1/24. Had not increase discomfort to his left foot and knee today. No abdominal pain. No nausea or vomiting.   In the ED, he was febrile up to 103.71F, tachycardic to heart rate of 126, BP of 140/77 on room air.  Mildly elevated leukocytosis of 11 with lactate of 2.3.  Mild hyponatremia of 133, hypokalemia of 3.4, CBG of 115, normal creatinine at 0.98.  UA was grossly positive with large leukocyte, negative nitrite and rare bacteria. Negative flu/COVID PCR/RSV.  Negative bilateral venous Doppler ultrasound.    Review of Systems: As mentioned in the history of present illness. All other systems reviewed and are negative. Past Medical History:  Diagnosis Date   Arthritis    Back   Benign localized prostatic hyperplasia with lower urinary tract symptoms (LUTS)    urologist--- dr Cain Sieve   Bilateral recurrent inguinal hernia    GERD (gastroesophageal reflux disease)    History of adenomatous polyp of colon    History of COVID-19 05/2020   per pt mild symptoms that resovled   Hyperlipidemia    Hypertension    followed by pcp   Type 2 diabetes mellitus ([place])    followed by pcp   (07-17-2021  per pt does not take check blood sugar )   Past Surgical History:  Procedure Laterality Date   COLONOSCOPY  12/28/2020   by dr h. danis   FOOT ARTHRODESIS Left 05/28/2022   Procedure: REVISION TRIPLE  ARTHRODESIS;  Surgeon: Erle Crocker, MD;  Location: [facility];  Service: Orthopedics;  Laterality: Left;   HARDWARE REMOVAL Left 05/28/2022   Procedure: DEEP ORTHOPEDIC HARDWARE REMOVAL, LEFT FOOT POSTERIOR, MEDIAL AND LATERAL;  Surgeon: Erle Crocker, MD;  Location: [facility];  Service: Orthopedics;  Laterality: Left;  LENGTH OF SURGERY: 180 MINUTES   INGUINAL HERNIA REPAIR Bilateral    right 1987 and left 1988   INGUINAL HERNIA REPAIR Bilateral 07/20/2021   Procedure: LAPAROSCOPIC RECURRENT BILATERAL INGUINAL HERNIA REPAIR WITH MESH;  Surgeon: Stechschulte, Nickola Major, MD;  Location: [facility];  Service: General;  Laterality: Bilateral;   Social History:  reports that he has never smoked. He has never used smokeless tobacco. He reports current alcohol use of about 2.0 - 3.0 standard drinks of alcohol per week. He reports that he does not use drugs.  No Known Allergies  Family History  Problem Relation Age of Onset   Hypertension Mother    Hypertension Father    Heart failure Father    Lymphoma Sister    Colon cancer Neg Hx    Esophageal cancer Neg Hx    Rectal cancer Neg Hx    Stomach cancer Neg Hx    Colon polyps Neg Hx     Prior to Admission medications   Medication Sig Start Date End Date Taking? Authorizing Provider  aspirin (BAYER ASPIRIN) 325 MG tablet Take 1 tablet by mouth daily for 30  DAYS for blood clot prevention 05/31/22   [place], Jesse J, PA-C  hydrochlorothiazide (HYDRODIURIL) 25 MG tablet TAKE 1 TABLET BY MOUTH EVERY DAY Patient taking differently: Take 25 mg by mouth every morning. 12/13/21   [name], PA  hydrOXYzine (VISTARIL) 25 MG capsule TAKE 1 CAPSULE (25 MG TOTAL) BY MOUTH AT BEDTIME AS NEEDED (SLEEP). 06/03/22   [name], PA  ibuprofen (ADVIL) 800 MG tablet TAKE 1 TABLET BY MOUTH EVERY 6 HOURS AS NEEDED. 06/13/22   Felipa Furnace, DPM  metFORMIN (GLUCOPHAGE) 500 MG tablet Take 1 tablet (500 mg total) by mouth daily with breakfast.  08/23/20   [name], PA  methocarbamol (ROBAXIN) 500 MG tablet Take 1 tablet (500 mg total) by mouth every 6 (six) hours as needed for muscle spasms. 05/31/22   [place], Jesse J, PA-C  oxyCODONE (ROXICODONE) 5 MG immediate release tablet Take 1-2 tablets by mouth every 4-6 hours as needed for post op pain 05/31/22   Martin[place], Jesse J, [place]    Physical Exam: Vitals:   06/15/22 1835 06/15/22 1840 06/15/22 1845 06/15/22 2035  BP:   (!) 143/77 131/65  Pulse: 95 100 96 97  Resp:      Temp:      TempSrc:      SpO2: 100% 100% 100% 99%  Weight:      Height:       Constitutional: NAD, calm, comfortable, nontoxic appearing middle-age male sitting upright in bed Eyes: lids and conjunctivae normal ENMT: Mucous membranes are moist. Posterior pharynx clear of any exudate or lesions.Normal dentition.  Neck: normal, supple Respiratory: clear to auscultation bilaterally, no wheezing, no crackles. Normal respiratory effort. No accessory muscle use.  Cardiovascular: Regular rate and rhythm, no murmurs / rubs / gallops.  Nonpitting edema of the left foot.   Abdomen: Soft, nontender nondistended.  Bowel sounds positive.  Musculoskeletal: no clubbing / cyanosis. No joint deformity upper and lower extremities. Good ROM, no contractures. Normal muscle tone.  Skin: Several vertical well-healed laying wounds on dorsal foot as well as posterior calcaneus region without any erythema, increased warmth to the touch or any drainage. Neurologic: CN 2-12 grossly intact.  Strength 5/5 in all 4.  Psychiatric: Normal judgment and insight. Alert and oriented x 3. Normal mood. Data Reviewed:  See HPI  Assessment and Plan: * Sepsis secondary to UTI [facility]) -Presented with fever of 103.72F, HR 126 and grossly positive UA with symptoms of increase urinary frequency -will continue IV Rocephin -continuous IV fluid overnight -trend lactate  History of foot surgery -Recent left foot revision triple arthrodesis with  hardware removal on 1/24 with Dr. Lucia Gaskins.    -negative bilateral venous Doppler ultrasound today -Appears to be healing well with no signs of infection from that stand point. Dr. Lucia Gaskins recommends placing him back on short leg splint.  -Continue follow-up outpatient with orthopedics  Pre-diabetes -HA1C 8/23 -6.1 -no hyperglycemia today. Will just monitor with morning labs.   Essential hypertension -continue home antihypertensive  Obesity BMI of 34      Advance Care Planning: Full  Consults: EDP curbsided ortho  Family Communication: wife at bedside  Severity of Illness: The appropriate patient status for this patient is OBSERVATION. Observation status is judged to be reasonable and necessary in order to provide the required intensity of service to ensure the patient's safety. The patient's presenting symptoms, physical exam findings, and initial radiographic and laboratory data in the context of their medical condition is felt to place them at decreased  risk for further clinical deterioration. Furthermore, it is anticipated that the patient will be medically stable for discharge from the hospital within 2 midnights of admission.   Author: Orene Desanctis, DO 06/15/2022 8:48 PM  For on call review [email or web address].

## 2022-06-15 NOTE — Assessment & Plan Note (Signed)
-  HA1C 8/23 -6.1 -no hyperglycemia today. Will just monitor with morning labs.

## 2022-06-15 NOTE — Assessment & Plan Note (Signed)
-  Presented with fever of 103.46F, HR 126 and grossly positive UA with symptoms of increase urinary frequency -will continue IV Rocephin -continuous IV fluid overnight -trend lactate

## 2022-06-15 NOTE — ED Triage Notes (Signed)
Had left foot surgery on 1/23 reports pain got worse and has been having a fever and chills.  Patient is hyperventilating

## 2022-06-15 NOTE — Progress Notes (Signed)
Pt being followed by [facility] for Sepsis protocol. 

## 2022-06-15 NOTE — Progress Notes (Signed)
VASCULAR LAB    Left lower extremity venous duplex has been performed.  See CV proc for preliminary results.   Raeven Pint, RVT 06/15/2022, 7:14 PM

## 2022-06-15 NOTE — Progress Notes (Signed)
Orthopedic Tech Progress Note Patient Details:  Troy Arellano Dec 06, 1965 [id number]  Casting Type of Cast: Short leg cast Cast Location: lle Cast Material: Fiberglass Cast Intervention: Application After the Rn dressed the stitches on the leg I applied a lle short leg cast with assist from the RN to hold the leg. As I a was applying the cast the patient was saying the webril was to tight and the family member was saying they thought the webril was wrong somehow but couldn't explain what wasn't right. After the patient said for the second time the webril was to tight on his calf, even though it was as it should be. I explained it wasn't possible to put on a cast loose. Then I removed all the webril and started over leaving the webril as loose as I could making sure with the patient as I went along that they were happy with how it was, they were ok with it. I managed to apply the cast and the patient said it wasn't to tight. The cast was a little wavy on the bottom side under the foot.  Post Interventions Patient Tolerated: Well Instructions Provided: Care of device, Adjustment of device   Karolee Stamps 06/15/2022, 9:37 PM

## 2022-06-15 NOTE — ED Notes (Addendum)
This RN notified the ortho tech, Tresa Moore that this patient needs his short cast applied; Ortho tech en route to ED.

## 2022-06-15 NOTE — Progress Notes (Signed)
Orthopedic Tech Progress Note Patient Details:  Troy Arellano 1965/07/05 [id number]  Casting Type of Cast: Short leg cast Cast Location: Left foot Cast Material: Fiberglass Cast Intervention: Removal  Post Interventions Patient Tolerated: Well   Linus Salmons Stephaine Breshears 06/15/2022, 6:12 PM

## 2022-06-16 DIAGNOSIS — A419 Sepsis, unspecified organism: Secondary | ICD-10-CM | POA: Diagnosis present

## 2022-06-16 DIAGNOSIS — Z8616 Personal history of COVID-19: Secondary | ICD-10-CM | POA: Diagnosis not present

## 2022-06-16 DIAGNOSIS — R6520 Severe sepsis without septic shock: Secondary | ICD-10-CM | POA: Diagnosis present

## 2022-06-16 DIAGNOSIS — K219 Gastro-esophageal reflux disease without esophagitis: Secondary | ICD-10-CM | POA: Diagnosis present

## 2022-06-16 DIAGNOSIS — A4151 Sepsis due to Escherichia coli [E. coli]: Secondary | ICD-10-CM | POA: Diagnosis present

## 2022-06-16 DIAGNOSIS — I10 Essential (primary) hypertension: Secondary | ICD-10-CM | POA: Diagnosis present

## 2022-06-16 DIAGNOSIS — R7303 Prediabetes: Secondary | ICD-10-CM | POA: Diagnosis not present

## 2022-06-16 DIAGNOSIS — E872 Acidosis, unspecified: Secondary | ICD-10-CM

## 2022-06-16 DIAGNOSIS — N400 Enlarged prostate without lower urinary tract symptoms: Secondary | ICD-10-CM | POA: Diagnosis present

## 2022-06-16 DIAGNOSIS — Z1152 Encounter for screening for COVID-19: Secondary | ICD-10-CM | POA: Diagnosis not present

## 2022-06-16 DIAGNOSIS — Z9889 Other specified postprocedural states: Secondary | ICD-10-CM | POA: Diagnosis not present

## 2022-06-16 DIAGNOSIS — E119 Type 2 diabetes mellitus without complications: Secondary | ICD-10-CM | POA: Diagnosis present

## 2022-06-16 DIAGNOSIS — E871 Hypo-osmolality and hyponatremia: Secondary | ICD-10-CM | POA: Diagnosis present

## 2022-06-16 DIAGNOSIS — Z6834 Body mass index (BMI) 34.0-34.9, adult: Secondary | ICD-10-CM | POA: Diagnosis not present

## 2022-06-16 DIAGNOSIS — E785 Hyperlipidemia, unspecified: Secondary | ICD-10-CM | POA: Diagnosis present

## 2022-06-16 DIAGNOSIS — Z8249 Family history of ischemic heart disease and other diseases of the circulatory system: Secondary | ICD-10-CM | POA: Diagnosis not present

## 2022-06-16 DIAGNOSIS — B9620 Unspecified Escherichia coli [E. coli] as the cause of diseases classified elsewhere: Secondary | ICD-10-CM

## 2022-06-16 DIAGNOSIS — Z7984 Long term (current) use of oral hypoglycemic drugs: Secondary | ICD-10-CM | POA: Diagnosis not present

## 2022-06-16 DIAGNOSIS — Z807 Family history of other malignant neoplasms of lymphoid, hematopoietic and related tissues: Secondary | ICD-10-CM | POA: Diagnosis not present

## 2022-06-16 DIAGNOSIS — Z79899 Other long term (current) drug therapy: Secondary | ICD-10-CM | POA: Diagnosis not present

## 2022-06-16 DIAGNOSIS — T502X5A Adverse effect of carbonic-anhydrase inhibitors, benzothiadiazides and other diuretics, initial encounter: Secondary | ICD-10-CM | POA: Diagnosis not present

## 2022-06-16 DIAGNOSIS — E876 Hypokalemia: Secondary | ICD-10-CM | POA: Diagnosis present

## 2022-06-16 DIAGNOSIS — E669 Obesity, unspecified: Secondary | ICD-10-CM | POA: Diagnosis present

## 2022-06-16 DIAGNOSIS — N3000 Acute cystitis without hematuria: Secondary | ICD-10-CM | POA: Diagnosis present

## 2022-06-16 LAB — BLOOD CULTURE ID PANEL (REFLEXED) - BCID2

## 2022-06-16 LAB — BASIC METABOLIC PANEL
Anion gap: 10 (ref 5–15)
BUN: 7 mg/dL (ref 6–20)
CO2: 21 mmol/L — ABNORMAL LOW (ref 22–32)
Calcium: 8.4 mg/dL — ABNORMAL LOW (ref 8.9–10.3)
Chloride: 106 mmol/L (ref 98–111)
Creatinine, Ser: 1.12 mg/dL (ref 0.61–1.24)
GFR, Estimated: >60 mL/min (ref >60)
Glucose, Bld: 141 mg/dL — ABNORMAL HIGH (ref 70–99)
Potassium: 3.2 mmol/L — ABNORMAL LOW (ref 3.5–5.1)
Sodium: 137 mmol/L (ref 135–145)

## 2022-06-16 LAB — CBC
HCT: 34.1 % — ABNORMAL LOW (ref 39.0–52.0)
Hemoglobin: 11.4 g/dL — ABNORMAL LOW (ref 13.0–17.0)
MCH: 28.4 pg (ref 26.0–34.0)
MCHC: 33.4 g/dL (ref 30.0–36.0)
MCV: 84.8 fL (ref 80.0–100.0)
Platelets: 201 10*3/uL (ref 150–400)
RBC: 4.02 MIL/uL — ABNORMAL LOW (ref 4.22–5.81)
RDW: 13.4 % (ref 11.5–15.5)
WBC: 8.6 10*3/uL (ref 4.0–10.5)
nRBC: 0 % (ref 0.0–0.2)

## 2022-06-16 LAB — MAGNESIUM: Magnesium: 1.6 mg/dL — ABNORMAL LOW (ref 1.7–2.4)

## 2022-06-16 LAB — LACTIC ACID, PLASMA: Lactic Acid, Venous: 1.6 mmol/L (ref 0.5–1.9)

## 2022-06-16 LAB — HEMOGLOBIN A1C
Hgb A1c MFr Bld: 6.1 % — ABNORMAL HIGH (ref 4.8–5.6)
Mean Plasma Glucose: 128.37 mg/dL

## 2022-06-16 MED ORDER — MAGNESIUM SULFATE 2 GM/50ML IV SOLN
2.0000 g | Freq: Once | INTRAVENOUS | Status: AC
  Administered 2022-06-16: 2 g via INTRAVENOUS
  Filled 2022-06-16: qty 50

## 2022-06-16 MED ORDER — POTASSIUM CHLORIDE CRYS ER 20 MEQ PO TBCR
40.0000 meq | EXTENDED_RELEASE_TABLET | ORAL | Status: AC
  Administered 2022-06-16 (×2): 40 meq via ORAL
  Filled 2022-06-16 (×2): qty 2

## 2022-06-16 MED ORDER — HYDROCHLOROTHIAZIDE 25 MG PO TABS
25.0000 mg | ORAL_TABLET | Freq: Every day | ORAL | Status: DC
  Administered 2022-06-16 – 2022-06-18 (×3): 25 mg via ORAL
  Filled 2022-06-16 (×3): qty 1

## 2022-06-16 MED ORDER — SODIUM CHLORIDE 0.9 % IV SOLN
2.0000 g | INTRAVENOUS | Status: DC
  Administered 2022-06-16 – 2022-06-17 (×2): 2 g via INTRAVENOUS
  Filled 2022-06-16 (×2): qty 20

## 2022-06-16 MED ORDER — LABETALOL HCL 5 MG/ML IV SOLN: 10.0000 mg | INTRAVENOUS | Status: DC | PRN

## 2022-06-16 MED ORDER — ONDANSETRON HCL 4 MG/2ML IJ SOLN
4.0000 mg | Freq: Four times a day (QID) | INTRAMUSCULAR | Status: DC | PRN
  Administered 2022-06-16: 4 mg via INTRAVENOUS
  Filled 2022-06-16 (×2): qty 2

## 2022-06-16 MED ORDER — HYDROXYZINE HCL 10 MG PO TABS
10.0000 mg | ORAL_TABLET | Freq: Once | ORAL | Status: AC | PRN
  Administered 2022-06-16: 10 mg via ORAL
  Filled 2022-06-16: qty 1

## 2022-06-16 MED ORDER — HYDROMORPHONE HCL 1 MG/ML IJ SOLN
0.5000 mg | INTRAMUSCULAR | Status: DC | PRN
  Administered 2022-06-16 – 2022-06-18 (×7): 0.5 mg via INTRAVENOUS
  Filled 2022-06-16 (×7): qty 0.5

## 2022-06-16 NOTE — ED Notes (Signed)
Patient awake and alert, no s/s of distress, no new concerns at this time, will continue to monitor.

## 2022-06-16 NOTE — Progress Notes (Signed)
PHARMACY - PHYSICIAN COMMUNICATION CRITICAL VALUE ALERT - BLOOD CULTURE IDENTIFICATION (BCID)  Troy Arellano is an 57 y.o. male who presented to [facility] on 06/15/2022 with a chief complaint of Sepsis  Assessment:  2 of 4 blood cultures with E. Coli no resistance  Name of physician (or Provider) Contacted: Dr. Cyndia Skeeters  Current antibiotics: Ceftriaxone 2g Q24H  Changes to prescribed antibiotics recommended:  Continue ceftriaxone 2g Q24H  Results for orders placed or performed during the hospital encounter of 06/15/22  Blood Culture ID Panel (Reflexed) (Collected: 06/15/2022  5:08 PM)  Result Value Ref Range   Enterococcus faecalis NOT DETECTED NOT DETECTED   Enterococcus Faecium NOT DETECTED NOT DETECTED   Listeria monocytogenes NOT DETECTED NOT DETECTED   Staphylococcus species NOT DETECTED NOT DETECTED   Staphylococcus aureus (BCID) NOT DETECTED NOT DETECTED   Staphylococcus epidermidis NOT DETECTED NOT DETECTED   Staphylococcus lugdunensis NOT DETECTED NOT DETECTED   Streptococcus species NOT DETECTED NOT DETECTED   Streptococcus agalactiae NOT DETECTED NOT DETECTED   Streptococcus pneumoniae NOT DETECTED NOT DETECTED   Streptococcus pyogenes NOT DETECTED NOT DETECTED   A.calcoaceticus-baumannii NOT DETECTED NOT DETECTED   Bacteroides fragilis NOT DETECTED NOT DETECTED   Enterobacterales DETECTED (A) NOT DETECTED   Enterobacter cloacae complex NOT DETECTED NOT DETECTED   Escherichia coli DETECTED (A) NOT DETECTED   Klebsiella aerogenes NOT DETECTED NOT DETECTED   Klebsiella oxytoca NOT DETECTED NOT DETECTED   Klebsiella pneumoniae NOT DETECTED NOT DETECTED   Proteus species NOT DETECTED NOT DETECTED   Salmonella species NOT DETECTED NOT DETECTED   Serratia marcescens NOT DETECTED NOT DETECTED   Haemophilus influenzae NOT DETECTED NOT DETECTED   Neisseria meningitidis NOT DETECTED NOT DETECTED   Pseudomonas aeruginosa NOT DETECTED NOT DETECTED   Stenotrophomonas maltophilia  NOT DETECTED NOT DETECTED   Candida albicans NOT DETECTED NOT DETECTED   Candida auris NOT DETECTED NOT DETECTED   Candida glabrata NOT DETECTED NOT DETECTED   Candida krusei NOT DETECTED NOT DETECTED   Candida parapsilosis NOT DETECTED NOT DETECTED   Candida tropicalis NOT DETECTED NOT DETECTED   Cryptococcus neoformans/gattii NOT DETECTED NOT DETECTED   CTX-M ESBL NOT DETECTED NOT DETECTED   Carbapenem resistance IMP NOT DETECTED NOT DETECTED   Carbapenem resistance KPC NOT DETECTED NOT DETECTED   Carbapenem resistance NDM NOT DETECTED NOT DETECTED   Carbapenem resist OXA 48 LIKE NOT DETECTED NOT DETECTED   Carbapenem resistance VIM NOT DETECTED NOT DETECTED    Merrilee Jansky, PharmD Clinical Pharmacist 06/16/2022  8:07 AM

## 2022-06-16 NOTE — Progress Notes (Signed)
PROGRESS NOTE  Kenan Lu [id number] DOB: 10/26/1965   PCP: [name], PA  Patient is from: Home.  Independently ambulates at baseline.  DOA: 06/15/2022 LOS: 0  Chief complaints Chief Complaint  Patient presents with   Leg Pain   Urinary Frequency     Brief Narrative / Interim history: 57 year old M with PMH of HTN, HLD, obesity, prediabetes and recent left foot revision triple arthrodesis presenting with fever, chills and increased frequency of urination and admitted for severe sepsis due to urinary tract infection.  Blood cultures with GNR.    Subjective: Seen and examined earlier this morning.  No major events overnight of this morning.  Feels better.  No complaints.  Eager to go home but understands the need to stay in the hospital for IV antibiotics pending culture results.   Objective: Vitals:   06/16/22 0509 06/16/22 0926 06/16/22 0930 06/16/22 1042  BP: 138/82 (!) 161/85 (!) 157/85 (!) 165/78  Pulse: 94 89 86 97  Resp: 17 18  17  $ Temp: 98.8 F (37.1 C) 99.2 F (37.3 C)  99.7 F (37.6 C)  TempSrc: Oral Oral  Oral  SpO2: 100% 100% 100% 98%  Weight:      Height:        Examination:  GENERAL: No apparent distress.  Nontoxic. HEENT: MMM.  Vision and hearing grossly intact.  NECK: Supple.  No apparent JVD.  RESP:  No IWOB.  Fair aeration bilaterally. CVS:  RRR. Heart sounds normal.  ABD/GI/GU: BS+. Abd soft, NTND.  No CVA tenderness. MSK/EXT:  Moves extremities.  Cast over left leg and left foot SKIN: no apparent skin lesion or wound NEURO: Awake, alert and oriented appropriately.  No apparent focal neuro deficit. PSYCH: Calm. Normal affect.   Procedures:  None  Microbiology summarized: [id number], influenza and RSV PCR nonreactive Blood cultures with GNR. Urine culture pending  Assessment and plan: Principal Problem:   Sepsis secondary to UTI [facility]) Active Problems:   Obesity   Essential hypertension   Pre-diabetes   History of foot  surgery   Severe sepsis ([place])   E coli bacteremia   Severe sepsis due to E. coli bacteremia and urinary tract infection: POA.  Had fever, tachycardia, tachypnea, leukocytosis and lactic acidosis.  No recent catheterization.  Sepsis physiology resolved.  Blood culture with E. coli. -Increase ceftriaxone to 2 g given bacteremia. -Follow-up blood culture sensitivity -Called and requested micro to add urine culture to previous sample.  History of foot surgery: recent left foot revision triple arthrodesis with hardware removal on 1/24 with Dr. Lucia Gaskins.  Lower extremity venous Doppler negative.  -Cast applied per recommendation by Dr. Lucia Gaskins -Continue follow-up outpatient with orthopedics   Pre-diabetes: A1c 6.1%.  Never over 6.3%.  Seems to be metformin at home. -Recheck hemoglobin A1c   Essential hypertension BP slightly elevated. -Resume home HCTZ  Hypokalemia/hypomagnesemia -Monitor replenish as appropriate   Obesity Body mass index is 34.46 kg/m.           DVT prophylaxis:  enoxaparin (LOVENOX) injection 40 mg Start: 06/15/22 2100  Code Status: Full code Family Communication: None at bedside Level of care: Med-Surg Status is: Observation The patient will require care spanning > 2 midnights and should be moved to inpatient because: Due to severe sepsis, bacteremia and complicated UTI   Final disposition: Home Consultants:  Orthopedic  55 minutes with more than 50% spent in reviewing records, counseling patient/family and coordinating care.   Sch Meds:  Scheduled Meds:  enoxaparin (LOVENOX) injection  40 mg Subcutaneous Q24H   hydrochlorothiazide  25 mg Oral Daily   Continuous Infusions:  cefTRIAXone (ROCEPHIN)  IV     magnesium sulfate bolus IVPB     PRN Meds:.acetaminophen, labetalol, methocarbamol, oxyCODONE  Antimicrobials: Anti-infectives (From admission, onward)    Start     Dose/Rate Route Frequency Ordered Stop   06/16/22 2200  cefTRIAXone (ROCEPHIN)  2 g in sodium chloride 0.9 % 100 mL IVPB        2 g 200 mL/hr over 30 Minutes Intravenous Every 24 hours 06/16/22 0640     06/16/22 2100  cefTRIAXone (ROCEPHIN) 1 g in sodium chloride 0.9 % 100 mL IVPB  Status:  Discontinued        1 g 200 mL/hr over 30 Minutes Intravenous Every 24 hours 06/15/22 2046 06/16/22 0640   06/15/22 1945  cefTRIAXone (ROCEPHIN) 2 g in sodium chloride 0.9 % 100 mL IVPB        2 g 200 mL/hr over 30 Minutes Intravenous  Once 06/15/22 1931 06/15/22 2204        I have personally reviewed the following labs and images: CBC: Recent Labs  Lab 06/15/22 1708 06/16/22 0101  WBC 11.0* 8.6  NEUTROABS 9.4*  --   HGB 13.0 11.4*  HCT 37.8* 34.1*  MCV 84.0 84.8  PLT 257 201   BMP &GFR Recent Labs  Lab 06/15/22 1708 06/16/22 0101  NA 133* 137  K 3.4* 3.2*  CL 101 106  CO2 21* 21*  GLUCOSE 115* 141*  BUN 8 7  CREATININE 0.98 1.12  CALCIUM 9.1 8.4*  MG  --  1.6*   Estimated Creatinine Clearance: 83.9 mL/min (by C-G formula based on SCr of 1.12 mg/dL). Liver & Pancreas: Recent Labs  Lab 06/15/22 1708  AST 27  ALT 26  ALKPHOS 68  BILITOT 0.7  PROT 8.0  ALBUMIN 3.4*   No results for input(s): "LIPASE", "AMYLASE" in the last 168 hours. No results for input(s): "AMMONIA" in the last 168 hours. Diabetic: Recent Labs    06/16/22 0101  HGBA1C 6.1*   Recent Labs  Lab 06/15/22 1700  GLUCAP 116*   Cardiac Enzymes: No results for input(s): "CKTOTAL", "CKMB", "CKMBINDEX", "TROPONINI" in the last 168 hours. No results for input(s): "PROBNP" in the last 8760 hours. Coagulation Profile: Recent Labs  Lab 06/15/22 1708  INR 1.0   Thyroid Function Tests: No results for input(s): "TSH", "T4TOTAL", "FREET4", "T3FREE", "THYROIDAB" in the last 72 hours. Lipid Profile: No results for input(s): "CHOL", "HDL", "LDLCALC", "TRIG", "CHOLHDL", "LDLDIRECT" in the last 72 hours. Anemia Panel: No results for input(s): "VITAMINB12", "FOLATE", "FERRITIN", "TIBC",  "IRON", "RETICCTPCT" in the last 72 hours. Urine analysis:    Component Value Date/Time   COLORURINE YELLOW 06/15/2022 1658   APPEARANCEUR HAZY (A) 06/15/2022 1658   LABSPEC 1.017 06/15/2022 1658   PHURINE 7.0 06/15/2022 1658   GLUCOSEU NEGATIVE 06/15/2022 1658   GLUCOSEU NEGATIVE 05/28/2021 1151   HGBUR MODERATE (A) 06/15/2022 1658   BILIRUBINUR NEGATIVE 06/15/2022 1658   KETONESUR NEGATIVE 06/15/2022 1658   PROTEINUR 30 (A) 06/15/2022 1658   UROBILINOGEN 0.2 05/28/2021 1151   NITRITE POSITIVE (A) 06/15/2022 1658   LEUKOCYTESUR LARGE (A) 06/15/2022 1658   Sepsis Labs: Invalid input(s): "PROCALCITONIN", "LACTICIDVEN"  Microbiology: Recent Results (from the past 240 hour(s))  Culture, blood (Routine x 2)     Status: None (Preliminary result)   Collection Time: 06/15/22  5:03 PM   Specimen: BLOOD  Result Value Ref Range Status  Specimen Description BLOOD SITE NOT SPECIFIED  Final   Special Requests   Final    BOTTLES DRAWN AEROBIC AND ANAEROBIC Blood Culture adequate volume   Culture   Final    NO GROWTH < 24 HOURS Performed at [facility], [address] [place], [place] [address]    Report Status PENDING  Incomplete  Culture, blood (Routine x 2)     Status: None (Preliminary result)   Collection Time: 06/15/22  5:08 PM   Specimen: BLOOD  Result Value Ref Range Status   Specimen Description BLOOD RIGHT ANTECUBITAL  Final   Special Requests   Final    BOTTLES DRAWN AEROBIC AND ANAEROBIC Blood Culture adequate volume   Culture  Setup Time   Final    GRAM NEGATIVE RODS IN BOTH AEROBIC AND ANAEROBIC BOTTLES CRITICAL RESULT CALLED TO, READ BACK BY AND VERIFIED WITH: PHARMD L.BELL AT 0804 ON 06/16/2022 BY T.SAAD. Performed at [facility], [place] [address], [place], [place] [address]    Culture GRAM NEGATIVE RODS  Final   Report Status PENDING  Incomplete  Blood Culture ID Panel (Reflexed)     Status: Abnormal   Collection Time: 06/15/22  5:08 PM  Result  Value Ref Range Status   Enterococcus faecalis NOT DETECTED NOT DETECTED Final   Enterococcus Faecium NOT DETECTED NOT DETECTED Final   Listeria monocytogenes NOT DETECTED NOT DETECTED Final   Staphylococcus species NOT DETECTED NOT DETECTED Final   Staphylococcus aureus (BCID) NOT DETECTED NOT DETECTED Final   Staphylococcus epidermidis NOT DETECTED NOT DETECTED Final   Staphylococcus lugdunensis NOT DETECTED NOT DETECTED Final   Streptococcus species NOT DETECTED NOT DETECTED Final   Streptococcus agalactiae NOT DETECTED NOT DETECTED Final   Streptococcus pneumoniae NOT DETECTED NOT DETECTED Final   Streptococcus pyogenes NOT DETECTED NOT DETECTED Final   A.calcoaceticus-baumannii NOT DETECTED NOT DETECTED Final   Bacteroides fragilis NOT DETECTED NOT DETECTED Final   Enterobacterales DETECTED (A) NOT DETECTED Final    Comment: Enterobacterales represent a large order of gram negative bacteria, not a single organism. CRITICAL RESULT CALLED TO, READ BACK BY AND VERIFIED WITH: PHARMD L.BELL AT 0804 ON 06/16/2022 BY T.SAAD.    Enterobacter cloacae complex NOT DETECTED NOT DETECTED Final   Escherichia coli DETECTED (A) NOT DETECTED Final    Comment: CRITICAL RESULT CALLED TO, READ BACK BY AND VERIFIED WITH: PHARMD L.BELL AT 0804 ON 06/16/2022 BY T.SAAD.    Klebsiella aerogenes NOT DETECTED NOT DETECTED Final   Klebsiella oxytoca NOT DETECTED NOT DETECTED Final   Klebsiella pneumoniae NOT DETECTED NOT DETECTED Final   Proteus species NOT DETECTED NOT DETECTED Final   Salmonella species NOT DETECTED NOT DETECTED Final   Serratia marcescens NOT DETECTED NOT DETECTED Final   Haemophilus influenzae NOT DETECTED NOT DETECTED Final   Neisseria meningitidis NOT DETECTED NOT DETECTED Final   Pseudomonas aeruginosa NOT DETECTED NOT DETECTED Final   Stenotrophomonas maltophilia NOT DETECTED NOT DETECTED Final   Candida albicans NOT DETECTED NOT DETECTED Final   Candida auris NOT DETECTED NOT  DETECTED Final   Candida glabrata NOT DETECTED NOT DETECTED Final   Candida krusei NOT DETECTED NOT DETECTED Final   Candida parapsilosis NOT DETECTED NOT DETECTED Final   Candida tropicalis NOT DETECTED NOT DETECTED Final   Cryptococcus neoformans/gattii NOT DETECTED NOT DETECTED Final   CTX-M ESBL NOT DETECTED NOT DETECTED Final   Carbapenem resistance IMP NOT DETECTED NOT DETECTED Final   Carbapenem resistance KPC NOT DETECTED  NOT DETECTED Final   Carbapenem resistance NDM NOT DETECTED NOT DETECTED Final   Carbapenem resist OXA 48 LIKE NOT DETECTED NOT DETECTED Final   Carbapenem resistance VIM NOT DETECTED NOT DETECTED Final    Comment: Performed at [facility], [place] [address], [place], [place] [address]  Resp panel by RT-PCR (RSV, Flu A&B, Covid) Anterior Nasal Swab     Status: None   Collection Time: 06/15/22  6:53 PM   Specimen: Anterior Nasal Swab  Result Value Ref Range Status   SARS Coronavirus 2 by RT PCR NEGATIVE NEGATIVE Final   Influenza A by PCR NEGATIVE NEGATIVE Final   Influenza B by PCR NEGATIVE NEGATIVE Final    Comment: (NOTE) The Xpert Xpress SARS-CoV-2/FLU/RSV plus assay is intended as an aid in the diagnosis of influenza from Nasopharyngeal swab specimens and should not be used as a sole basis for treatment. Nasal washings and aspirates are unacceptable for Xpert Xpress SARS-CoV-2/FLU/RSV testing.  Fact Sheet for Patients: EntrepreneurPulse.com.au  Fact Sheet for Healthcare Providers: IncredibleEmployment.be  This test is not yet approved or cleared by the [place] FDA and has been authorized for detection and/or diagnosis of SARS-CoV-2 by FDA under an Emergency Use Authorization (EUA). This EUA will remain in effect (meaning this test can be used) for the duration of the COVID-19 declaration under Section 564(b)(1) of the Act, 21 U.S.C. section 360bbb-3(b)(1), unless the authorization is terminated  or revoked.     Resp Syncytial Virus by PCR NEGATIVE NEGATIVE Final    Comment: (NOTE) Fact Sheet for Patients: EntrepreneurPulse.com.au  Fact Sheet for Healthcare Providers: IncredibleEmployment.be  This test is not yet approved or cleared by the [place] FDA and has been authorized for detection and/or diagnosis of SARS-CoV-2 by FDA under an Emergency Use Authorization (EUA). This EUA will remain in effect (meaning this test can be used) for the duration of the COVID-19 declaration under Section 564(b)(1) of the Act, 21 U.S.C. section 360bbb-3(b)(1), unless the authorization is terminated or revoked.  Performed at [facility], [place] [address] [place], [place] [address]     Radiology Studies: VAS [place] LOWER EXTREMITY VENOUS (DVT)  Result Date: 06/15/2022  Lower Venous DVT Study Patient Name:  MYCAL SISTARE  Date of Exam:   06/15/2022 Medical Rec #: [id number]    Accession #:    [id number] Date of Birth: [birth year]    Patient Gender: M Patient Age:   34 years Exam Location:  [facility] Procedure:      VAS [place] LOWER EXTREMITY VENOUS (DVT) Referring Phys: ADAM CURATOLO --------------------------------------------------------------------------------  Indications: Pain, and Swelling.  Comparison Study: No prior study on file Performing Technologist: Sharion Dove RVS  Examination Guidelines: A complete evaluation includes B-mode imaging, spectral Doppler, color Doppler, and power Doppler as needed of all accessible portions of each vessel. Bilateral testing is considered an integral part of a complete examination. Limited examinations for reoccurring indications may be performed as noted. The reflux portion of the exam is performed with the patient in reverse Trendelenburg.  +-----+---------------+---------+-----------+----------+--------------+ RIGHTCompressibilityPhasicitySpontaneityPropertiesThrombus Aging  +-----+---------------+---------+-----------+----------+--------------+ CFV  Full           Yes      Yes                                 +-----+---------------+---------+-----------+----------+--------------+ SFJ  Full                                                        +-----+---------------+---------+-----------+----------+--------------+   +---------+---------------+---------+-----------+----------+--------------+  LEFT     CompressibilityPhasicitySpontaneityPropertiesThrombus Aging +---------+---------------+---------+-----------+----------+--------------+ CFV      Full           Yes      Yes                                 +---------+---------------+---------+-----------+----------+--------------+ SFJ      Full                                                        +---------+---------------+---------+-----------+----------+--------------+ FV Prox  Full                                                        +---------+---------------+---------+-----------+----------+--------------+ FV Mid   Full                                                        +---------+---------------+---------+-----------+----------+--------------+ FV DistalFull                                                        +---------+---------------+---------+-----------+----------+--------------+ PFV      Full                                                        +---------+---------------+---------+-----------+----------+--------------+ POP      Full           Yes      Yes                                 +---------+---------------+---------+-----------+----------+--------------+ PTV      Full                                                        +---------+---------------+---------+-----------+----------+--------------+ PERO     Full                                                         +---------+---------------+---------+-----------+----------+--------------+     Summary: RIGHT: - No evidence of common femoral vein obstruction.  LEFT: - There is no evidence of deep vein thrombosis in the lower extremity.  - No cystic structure found in the popliteal fossa.  *See table(s) above for measurements and observations.  Preliminary    DG Chest 2 View  Result Date: 06/15/2022 CLINICAL DATA:  Suspected sepsis. EXAM: CHEST - 2 VIEW COMPARISON:  05/24/2021. FINDINGS: Cardiac silhouette normal in size. Normal mediastinal and hilar contours. Clear lungs.  No convincing pleural effusion or pneumothorax. Skeletal structures are grossly intact. IMPRESSION: No active cardiopulmonary disease. Electronically Signed   By: Lajean Manes M.D.   On: 06/15/2022 17:42      Brenee Gajda T. [place]  If 7PM-7AM, please contact night-coverage [email or web address] 06/16/2022, 1:04 PM

## 2022-06-16 NOTE — ED Notes (Signed)
Patient leaving the floor in stable condition, AOX4, aware of his admission, with his belongings and staff.

## 2022-06-16 NOTE — Progress Notes (Signed)
   06/16/22 1421  Assess: MEWS Score  Temp (!) 102.4 F (39.1 C)  Assess: MEWS Score  MEWS Temp 2  MEWS Systolic 0  MEWS Pulse 0  MEWS RR 0  MEWS LOC 0  MEWS Score 2  MEWS Score Color Yellow  Assess: if the MEWS score is Yellow or Red  Were vital signs taken at a resting state? Yes  Focused Assessment No change from prior assessment  Does the patient meet 2 or more of the SIRS criteria? Yes  Does the patient have a confirmed or suspected source of infection? Yes  Provider and Rapid Response Notified? No  MEWS guidelines implemented  Yes, yellow  Treat  MEWS Interventions Considered administering scheduled or prn medications/treatments as ordered  Take Vital Signs  Increase Vital Sign Frequency  Yellow: Q2hr x1, continue Q4hrs until patient remains green for 12hrs  Escalate  MEWS: Escalate Yellow: Discuss with charge nurse and consider notifying provider and/or RRT  Notify: Charge Nurse/RN  Name of Charge Nurse/RN Notified Lauren, RN  Assess: SIRS CRITERIA  SIRS Temperature  1  SIRS Pulse 1  SIRS Respirations  0  SIRS WBC 0  SIRS Score Sum  2

## 2022-06-16 NOTE — ED Notes (Signed)
ED TO INPATIENT HANDOFF REPORT  ED Nurse Name and Phone #: Myrna Blazer Name/Age/Gender Troy Arellano 57 y.o. male Room/Bed: 010C/010C  Code Status   Code Status: Full Code  Home/SNF/Other Home Patient oriented to: self, place, time, and situation Is this baseline? Yes   Triage Complete: Triage complete  Chief Complaint Sepsis [facility]) [A41.9]  Triage Note Had left foot surgery on 1/23 reports pain got worse and has been having a fever and chills.  Patient is hyperventilating    Allergies No Known Allergies  Level of Care/Admitting Diagnosis ED Disposition     ED Disposition  Admit   Condition  --   [place]: [place] [100100]  Level of Care: Telemetry Medical [104]  May place patient in observation at [facility] or [place] if equivalent level of care is available:: No  Covid Evaluation: Asymptomatic - no recent exposure (last 10 days) testing not required  Diagnosis: Sepsis [facility]) [id number]  Admitting Physician: Orene Desanctis [id number]  Attending Physician: Orene Desanctis [id number]          B Medical/Surgery History Past Medical History:  Diagnosis Date   Arthritis    Back   Benign localized prostatic hyperplasia with lower urinary tract symptoms (LUTS)    urologist--- dr Cain Sieve   Bilateral recurrent inguinal hernia    GERD (gastroesophageal reflux disease)    History of adenomatous polyp of colon    History of COVID-19 05/2020   per pt mild symptoms that resovled   Hyperlipidemia    Hypertension    followed by pcp   Type 2 diabetes mellitus ([place])    followed by pcp   (07-17-2021  per pt does not take check blood sugar )   Past Surgical History:  Procedure Laterality Date   COLONOSCOPY  12/28/2020   by dr h. danis   FOOT ARTHRODESIS Left 05/28/2022   Procedure: REVISION TRIPLE ARTHRODESIS;  Surgeon: Erle Crocker, MD;  Location: [place];  Service: Orthopedics;  Laterality: Left;   HARDWARE REMOVAL Left  05/28/2022   Procedure: DEEP ORTHOPEDIC HARDWARE REMOVAL, LEFT FOOT POSTERIOR, MEDIAL AND LATERAL;  Surgeon: Erle Crocker, MD;  Location: [place];  Service: Orthopedics;  Laterality: Left;  LENGTH OF SURGERY: 180 MINUTES   INGUINAL HERNIA REPAIR Bilateral    right 1987 and left 1988   INGUINAL HERNIA REPAIR Bilateral 07/20/2021   Procedure: LAPAROSCOPIC RECURRENT BILATERAL INGUINAL HERNIA REPAIR WITH MESH;  Surgeon: Stechschulte, Nickola Major, MD;  Location: [place];  Service: General;  Laterality: Bilateral;     A IV Location/Drains/Wounds Patient Lines/Drains/Airways Status     Active Line/Drains/Airways     Name Placement date Placement time Site Days   Peripheral IV 06/15/22 20 G Anterior;Left Hand 06/15/22  1823  Hand  1   Closed System Drain 1 Left Foot Bulb (JP) 15 Fr. 05/28/22  1105  Foot  19   Incision - 3 Ports Abdomen Umbilicus Left Right [id number]  1104  -- 331            Intake/Output Last 24 hours  Intake/Output Summary (Last 24 hours) at 06/16/2022 [id number] Last data filed at 06/16/2022 [id number] Gross per 24 hour  Intake 3297.18 ml  Output 800 ml  Net 2497.18 ml    Labs/Imaging Results for orders placed or performed during the hospital encounter of 06/15/22 (from the past 48 hour(s))  Lactic acid, plasma     Status: Abnormal   Collection  Time: 06/15/22  4:58 PM  Result Value Ref Range   Lactic Acid, Venous 3.0 (HH) 0.5 - 1.9 mmol/L    Comment: CRITICAL RESULT CALLED TO, READ BACK BY AND VERIFIED WITH Mellody Memos RN 06/15/22 @1814$  BY J. WHITE Performed at [facility] [address], [place], [place] [address]   Urinalysis, Routine w reflex microscopic -Urine, Clean Catch     Status: Abnormal   Collection Time: 06/15/22  4:58 PM  Result Value Ref Range   Color, Urine YELLOW YELLOW   APPearance HAZY (A) CLEAR   Specific Gravity, Urine 1.017 1.005 - 1.030   pH 7.0 5.0 - 8.0   Glucose, UA NEGATIVE NEGATIVE mg/dL   Hgb urine dipstick MODERATE  (A) NEGATIVE   Bilirubin Urine NEGATIVE NEGATIVE   Ketones, ur NEGATIVE NEGATIVE mg/dL   Protein, ur 30 (A) NEGATIVE mg/dL   Nitrite POSITIVE (A) NEGATIVE   Leukocytes,Ua LARGE (A) NEGATIVE   RBC / HPF >50 0 - 5 RBC/hpf   WBC, UA >50 0 - 5 WBC/hpf   Bacteria, UA RARE (A) NONE SEEN   Squamous Epithelial / HPF 0-5 0 - 5 /HPF   Mucus PRESENT     Comment: Performed at [facility], [address] [place], [place] [address]  CBG monitoring, ED     Status: Abnormal   Collection Time: 06/15/22  5:00 PM  Result Value Ref Range   Glucose-Capillary 116 (H) 70 - 99 mg/dL    Comment: Glucose reference range applies only to samples taken after fasting for at least 8 hours.  Culture, blood (Routine x 2)     Status: None (Preliminary result)   Collection Time: 06/15/22  5:03 PM   Specimen: BLOOD  Result Value Ref Range   Specimen Description BLOOD SITE NOT SPECIFIED    Special Requests      BOTTLES DRAWN AEROBIC AND ANAEROBIC Blood Culture adequate volume   Culture      NO GROWTH < 24 HOURS Performed at [facility], [place] [address], [place], [place] [address]    Report Status PENDING   Comprehensive metabolic panel     Status: Abnormal   Collection Time: 06/15/22  5:08 PM  Result Value Ref Range   Sodium 133 (L) 135 - 145 mmol/L   Potassium 3.4 (L) 3.5 - 5.1 mmol/L   Chloride 101 98 - 111 mmol/L   CO2 21 (L) 22 - 32 mmol/L   Glucose, Bld 115 (H) 70 - 99 mg/dL    Comment: Glucose reference range applies only to samples taken after fasting for at least 8 hours.   BUN 8 6 - 20 mg/dL   Creatinine, Ser 0.98 0.61 - 1.24 mg/dL   Calcium 9.1 8.9 - 10.3 mg/dL   Total Protein 8.0 6.5 - 8.1 g/dL   Albumin 3.4 (L) 3.5 - 5.0 g/dL   AST 27 15 - 41 U/L   ALT 26 0 - 44 U/L   Alkaline Phosphatase 68 38 - 126 U/L   Total Bilirubin 0.7 0.3 - 1.2 mg/dL   GFR, Estimated >60 >60 mL/min    Comment: (NOTE) Calculated using the CKD-EPI Creatinine Equation (2021)    Anion gap 11 5 - 15     Comment: Performed at [facility] [address] [place], [place] [address]  CBC with Differential     Status: Abnormal   Collection Time: 06/15/22  5:08 PM  Result Value Ref Range   WBC  11.0 (H) 4.0 - 10.5 K/uL   RBC 4.50 4.22 - 5.81 MIL/uL   Hemoglobin 13.0 13.0 - 17.0 g/dL   HCT 37.8 (L) 39.0 - 52.0 %   MCV 84.0 80.0 - 100.0 fL   MCH 28.9 26.0 - 34.0 pg   MCHC 34.4 30.0 - 36.0 g/dL   RDW 13.3 11.5 - 15.5 %   Platelets 257 150 - 400 K/uL   nRBC 0.0 0.0 - 0.2 %   Neutrophils Relative % 84 %   Neutro Abs 9.4 (H) 1.7 - 7.7 K/uL   Lymphocytes Relative 10 %   Lymphs Abs 1.1 0.7 - 4.0 K/uL   Monocytes Relative 5 %   Monocytes Absolute 0.5 0.1 - 1.0 K/uL   Eosinophils Relative 0 %   Eosinophils Absolute 0.0 0.0 - 0.5 K/uL   Basophils Relative 0 %   Basophils Absolute 0.0 0.0 - 0.1 K/uL   Immature Granulocytes 1 %   Abs Immature Granulocytes 0.06 0.00 - 0.07 K/uL    Comment: Performed at [facility] [address], [place], [place] [address]  Protime-INR     Status: None   Collection Time: 06/15/22  5:08 PM  Result Value Ref Range   Prothrombin Time 13.4 11.4 - 15.2 seconds   INR 1.0 0.8 - 1.2    Comment: (NOTE) INR goal varies based on device and disease states. Performed at [facility], [place] [address] [place], [place] [address]   Culture, blood (Routine x 2)     Status: None (Preliminary result)   Collection Time: 06/15/22  5:08 PM   Specimen: BLOOD  Result Value Ref Range   Specimen Description BLOOD RIGHT ANTECUBITAL    Special Requests      BOTTLES DRAWN AEROBIC AND ANAEROBIC Blood Culture adequate volume   Culture  Setup Time      GRAM NEGATIVE RODS IN BOTH AEROBIC AND ANAEROBIC BOTTLES CRITICAL RESULT CALLED TO, READ BACK BY AND VERIFIED WITH: PHARMD L.BELL AT 0804 ON 06/16/2022 BY T.SAAD. Performed at [facility], [place] [address], [place], [place] [address]    Culture GRAM NEGATIVE RODS    Report Status PENDING   Blood Culture ID  Panel (Reflexed)     Status: Abnormal   Collection Time: 06/15/22  5:08 PM  Result Value Ref Range   Enterococcus faecalis NOT DETECTED NOT DETECTED   Enterococcus Faecium NOT DETECTED NOT DETECTED   Listeria monocytogenes NOT DETECTED NOT DETECTED   Staphylococcus species NOT DETECTED NOT DETECTED   Staphylococcus aureus (BCID) NOT DETECTED NOT DETECTED   Staphylococcus epidermidis NOT DETECTED NOT DETECTED   Staphylococcus lugdunensis NOT DETECTED NOT DETECTED   Streptococcus species NOT DETECTED NOT DETECTED   Streptococcus agalactiae NOT DETECTED NOT DETECTED   Streptococcus pneumoniae NOT DETECTED NOT DETECTED   Streptococcus pyogenes NOT DETECTED NOT DETECTED   A.calcoaceticus-baumannii NOT DETECTED NOT DETECTED   Bacteroides fragilis NOT DETECTED NOT DETECTED   Enterobacterales DETECTED (A) NOT DETECTED    Comment: Enterobacterales represent a large order of gram negative bacteria, not a single organism. CRITICAL RESULT CALLED TO, READ BACK BY AND VERIFIED WITH: PHARMD L.BELL AT 0804 ON 06/16/2022 BY T.SAAD.    Enterobacter cloacae complex NOT DETECTED NOT DETECTED   Escherichia coli DETECTED (A) NOT DETECTED    Comment: CRITICAL RESULT CALLED TO, READ BACK BY AND VERIFIED WITH: PHARMD L.BELL AT 0804 ON 06/16/2022 BY T.SAAD.    Klebsiella aerogenes NOT DETECTED NOT DETECTED   Klebsiella  oxytoca NOT DETECTED NOT DETECTED   Klebsiella pneumoniae NOT DETECTED NOT DETECTED   Proteus species NOT DETECTED NOT DETECTED   Salmonella species NOT DETECTED NOT DETECTED   Serratia marcescens NOT DETECTED NOT DETECTED   Haemophilus influenzae NOT DETECTED NOT DETECTED   Neisseria meningitidis NOT DETECTED NOT DETECTED   Pseudomonas aeruginosa NOT DETECTED NOT DETECTED   Stenotrophomonas maltophilia NOT DETECTED NOT DETECTED   Candida albicans NOT DETECTED NOT DETECTED   Candida auris NOT DETECTED NOT DETECTED   Candida glabrata NOT DETECTED NOT DETECTED   Candida krusei NOT DETECTED  NOT DETECTED   Candida parapsilosis NOT DETECTED NOT DETECTED   Candida tropicalis NOT DETECTED NOT DETECTED   Cryptococcus neoformans/gattii NOT DETECTED NOT DETECTED   CTX-M ESBL NOT DETECTED NOT DETECTED   Carbapenem resistance IMP NOT DETECTED NOT DETECTED   Carbapenem resistance KPC NOT DETECTED NOT DETECTED   Carbapenem resistance NDM NOT DETECTED NOT DETECTED   Carbapenem resist OXA 48 LIKE NOT DETECTED NOT DETECTED   Carbapenem resistance VIM NOT DETECTED NOT DETECTED    Comment: Performed at [facility], [address], [place], [place] [address]  Lactic acid, plasma     Status: Abnormal   Collection Time: 06/15/22  6:32 PM  Result Value Ref Range   Lactic Acid, Venous 2.3 (HH) 0.5 - 1.9 mmol/L    Comment: CRITICAL VALUE NOTED. VALUE IS CONSISTENT WITH PREVIOUSLY REPORTED/CALLED VALUE Performed at [facility], [place] [address], [place], [place] [address]   Resp panel by RT-PCR (RSV, Flu A&B, Covid) Anterior Nasal Swab     Status: None   Collection Time: 06/15/22  6:53 PM   Specimen: Anterior Nasal Swab  Result Value Ref Range   SARS Coronavirus 2 by RT PCR NEGATIVE NEGATIVE   Influenza A by PCR NEGATIVE NEGATIVE   Influenza B by PCR NEGATIVE NEGATIVE    Comment: (NOTE) The Xpert Xpress SARS-CoV-2/FLU/RSV plus assay is intended as an aid in the diagnosis of influenza from Nasopharyngeal swab specimens and should not be used as a sole basis for treatment. Nasal washings and aspirates are unacceptable for Xpert Xpress SARS-CoV-2/FLU/RSV testing.  Fact Sheet for Patients: EntrepreneurPulse.com.au  Fact Sheet for Healthcare Providers: IncredibleEmployment.be  This test is not yet approved or cleared by the [place] FDA and has been authorized for detection and/or diagnosis of SARS-CoV-2 by FDA under an Emergency Use Authorization (EUA). This EUA will remain in effect (meaning this test can be used) for the duration of  the COVID-19 declaration under Section 564(b)(1) of the Act, 21 U.S.C. section 360bbb-3(b)(1), unless the authorization is terminated or revoked.     Resp Syncytial Virus by PCR NEGATIVE NEGATIVE    Comment: (NOTE) Fact Sheet for Patients: EntrepreneurPulse.com.au  Fact Sheet for Healthcare Providers: IncredibleEmployment.be  This test is not yet approved or cleared by the [place] FDA and has been authorized for detection and/or diagnosis of SARS-CoV-2 by FDA under an Emergency Use Authorization (EUA). This EUA will remain in effect (meaning this test can be used) for the duration of the COVID-19 declaration under Section 564(b)(1) of the Act, 21 U.S.C. section 360bbb-3(b)(1), unless the authorization is terminated or revoked.  Performed at [facility], [place] [address], [place], [place] [address]   Lactic acid, plasma     Status: None   Collection Time: 06/15/22 10:51 PM  Result Value Ref Range   Lactic Acid, Venous 1.7 0.5 - 1.9 mmol/L    Comment: Performed  at [facility], [place] [address] [place], [place] [address]  CBC     Status: Abnormal   Collection Time: 06/16/22  1:01 AM  Result Value Ref Range   WBC 8.6 4.0 - 10.5 K/uL   RBC 4.02 (L) 4.22 - 5.81 MIL/uL   Hemoglobin 11.4 (L) 13.0 - 17.0 g/dL   HCT 34.1 (L) 39.0 - 52.0 %   MCV 84.8 80.0 - 100.0 fL   MCH 28.4 26.0 - 34.0 pg   MCHC 33.4 30.0 - 36.0 g/dL   RDW 13.4 11.5 - 15.5 %   Platelets 201 150 - 400 K/uL   nRBC 0.0 0.0 - 0.2 %    Comment: Performed at [facility], [place] [address] [place], [place] [id number]  Basic metabolic panel     Status: Abnormal   Collection Time: 06/16/22  1:01 AM  Result Value Ref Range   Sodium 137 135 - 145 mmol/L   Potassium 3.2 (L) 3.5 - 5.1 mmol/L   Chloride 106 98 - 111 mmol/L   CO2 21 (L) 22 - 32 mmol/L   Glucose, Bld 141 (H) 70 - 99 mg/dL    Comment: Glucose reference range applies only to samples taken after fasting  for at least 8 hours.   BUN 7 6 - 20 mg/dL   Creatinine, Ser 1.12 0.61 - 1.24 mg/dL   Calcium 8.4 (L) 8.9 - 10.3 mg/dL   GFR, Estimated >60 >60 mL/min    Comment: (NOTE) Calculated using the CKD-EPI Creatinine Equation (2021)    Anion gap 10 5 - 15    Comment: Performed at [facility] [address], [place], [place] [address]  Lactic acid, plasma     Status: None   Collection Time: 06/16/22  1:01 AM  Result Value Ref Range   Lactic Acid, Venous 1.6 0.5 - 1.9 mmol/L    Comment: Performed at [facility] [address], [place], [place] [address]  Hemoglobin A1c     Status: Abnormal   Collection Time: 06/16/22  1:01 AM  Result Value Ref Range   Hgb A1c MFr Bld 6.1 (H) 4.8 - 5.6 %    Comment: (NOTE) Pre diabetes:          5.7%-6.4%  Diabetes:              >6.4%  Glycemic control for   <7.0% adults with diabetes    Mean Plasma Glucose 128.37 mg/dL    Comment: Performed at [facility] [address], [place], [place] [address]  Magnesium     Status: Abnormal   Collection Time: 06/16/22  1:01 AM  Result Value Ref Range   Magnesium 1.6 (L) 1.7 - 2.4 mg/dL    Comment: Performed at [facility] [address] [place], [place] [address]   VAS [place] LOWER EXTREMITY VENOUS (DVT)  Result Date: 06/15/2022  Lower Venous DVT Study Patient Name:  Troy Arellano  Date of Exam:   06/15/2022 Medical Rec #: [id number]    Accession #:    [id number] Date of Birth: December 13, 1965    Patient Gender: M Patient Age:   11 years Exam Location:  [facility] Procedure:      VAS [place] LOWER EXTREMITY VENOUS (DVT) Referring Phys: ADAM CURATOLO --------------------------------------------------------------------------------  Indications: Pain, and Swelling.  Comparison Study: No prior study on file Performing Technologist: Sharion Dove RVS  Examination Guidelines: A complete evaluation includes B-mode imaging, spectral Doppler,  color Doppler, and power Doppler as needed of all accessible  portions of each vessel. Bilateral testing is considered an integral part of a complete examination. Limited examinations for reoccurring indications may be performed as noted. The reflux portion of the exam is performed with the patient in reverse Trendelenburg.  +-----+---------------+---------+-----------+----------+--------------+ RIGHTCompressibilityPhasicitySpontaneityPropertiesThrombus Aging +-----+---------------+---------+-----------+----------+--------------+ CFV  Full           Yes      Yes                                 +-----+---------------+---------+-----------+----------+--------------+ SFJ  Full                                                        +-----+---------------+---------+-----------+----------+--------------+   +---------+---------------+---------+-----------+----------+--------------+ LEFT     CompressibilityPhasicitySpontaneityPropertiesThrombus Aging +---------+---------------+---------+-----------+----------+--------------+ CFV      Full           Yes      Yes                                 +---------+---------------+---------+-----------+----------+--------------+ SFJ      Full                                                        +---------+---------------+---------+-----------+----------+--------------+ FV Prox  Full                                                        +---------+---------------+---------+-----------+----------+--------------+ FV Mid   Full                                                        +---------+---------------+---------+-----------+----------+--------------+ FV DistalFull                                                        +---------+---------------+---------+-----------+----------+--------------+ PFV      Full                                                        +---------+---------------+---------+-----------+----------+--------------+ POP      Full           Yes       Yes                                 +---------+---------------+---------+-----------+----------+--------------+ PTV  Full                                                        +---------+---------------+---------+-----------+----------+--------------+ PERO     Full                                                        +---------+---------------+---------+-----------+----------+--------------+     Summary: RIGHT: - No evidence of common femoral vein obstruction.  LEFT: - There is no evidence of deep vein thrombosis in the lower extremity.  - No cystic structure found in the popliteal fossa.  *See table(s) above for measurements and observations.    Preliminary    DG Chest 2 View  Result Date: 06/15/2022 CLINICAL DATA:  Suspected sepsis. EXAM: CHEST - 2 VIEW COMPARISON:  05/24/2021. FINDINGS: Cardiac silhouette normal in size. Normal mediastinal and hilar contours. Clear lungs.  No convincing pleural effusion or pneumothorax. Skeletal structures are grossly intact. IMPRESSION: No active cardiopulmonary disease. Electronically Signed   By: Lajean Manes M.D.   On: 06/15/2022 17:42    Pending Labs Unresulted Labs (From admission, onward)     Start     Ordered   06/16/22 0841  Urine Culture (for pregnant, neutropenic or urologic patients or patients with an indwelling urinary catheter)  (Urine Labs)  Add-on,   AD       Question:  Indication  Answer:  Sepsis   06/16/22 0841            Vitals/Pain Today's Vitals   06/16/22 0034 06/16/22 0315 06/16/22 0509 06/16/22 0926  BP:   138/82 (!) 161/85  Pulse:  94 94 89  Resp:   17 18  Temp: (!) 100.8 F (38.2 C)  98.8 F (37.1 C) 99.2 F (37.3 C)  TempSrc: Oral  Oral Oral  SpO2:  99% 100% 100%  Weight:      Height:      PainSc:        Isolation Precautions No active isolations  Medications Medications  enoxaparin (LOVENOX) injection 40 mg (40 mg Subcutaneous Given 06/16/22 0145)  lactated ringers infusion (  Intravenous New Bag/Given 06/15/22 2207)  oxyCODONE (Oxy IR/ROXICODONE) immediate release tablet 5 mg (has no administration in time range)  ibuprofen (ADVIL) tablet 800 mg (800 mg Oral Given 06/15/22 2210)  methocarbamol (ROBAXIN) tablet 500 mg (has no administration in time range)  acetaminophen (TYLENOL) tablet 650 mg (650 mg Oral Given 06/15/22 2257)  cefTRIAXone (ROCEPHIN) 2 g in sodium chloride 0.9 % 100 mL IVPB (has no administration in time range)  potassium chloride SA (KLOR-CON M) CR tablet 40 mEq (40 mEq Oral Given 06/16/22 0744)  acetaminophen (TYLENOL) tablet 1,000 mg (1,000 mg Oral Given 06/15/22 1704)  sodium chloride 0.9 % bolus 1,000 mL (0 mLs Intravenous Stopped 06/15/22 2203)  fentaNYL (SUBLIMAZE) injection 50 mcg (50 mcg Intravenous Given 06/15/22 1823)  sodium chloride 0.9 % bolus 1,000 mL (0 mLs Intravenous Stopped 06/15/22 2203)  cefTRIAXone (ROCEPHIN) 2 g in sodium chloride 0.9 % 100 mL IVPB (0 g Intravenous Stopped 06/15/22 2204)  0.9 %  sodium chloride infusion (0 mLs Intravenous Stopped 06/16/22 0511)  morphine (PF) 4  MG/ML injection 4 mg (4 mg Intravenous Given 06/15/22 2035)    Mobility walks with device     Focused Assessments Sepsis/UTI   R Recommendations: See Admitting Provider Note  Report given to:   Additional Notes: PT AOX4, needs a walker due to limited to no weight bearing to left foot, recent surgery, able to verbalize needs, meds whole, prior to surgery did not need an assistive device

## 2022-06-17 DIAGNOSIS — E669 Obesity, unspecified: Secondary | ICD-10-CM | POA: Diagnosis not present

## 2022-06-17 DIAGNOSIS — A419 Sepsis, unspecified organism: Secondary | ICD-10-CM | POA: Diagnosis not present

## 2022-06-17 DIAGNOSIS — Z9889 Other specified postprocedural states: Secondary | ICD-10-CM | POA: Diagnosis not present

## 2022-06-17 DIAGNOSIS — I10 Essential (primary) hypertension: Secondary | ICD-10-CM | POA: Diagnosis not present

## 2022-06-17 LAB — CBC WITH DIFFERENTIAL/PLATELET
Abs Immature Granulocytes: 0.07 10*3/uL (ref 0.00–0.07)
Basophils Absolute: 0 10*3/uL (ref 0.0–0.1)
Basophils Relative: 0 %
Eosinophils Absolute: 0 10*3/uL (ref 0.0–0.5)
Eosinophils Relative: 0 %
HCT: 34.3 % — ABNORMAL LOW (ref 39.0–52.0)
Hemoglobin: 11.8 g/dL — ABNORMAL LOW (ref 13.0–17.0)
Immature Granulocytes: 1 %
Lymphocytes Relative: 13 %
Lymphs Abs: 1.4 10*3/uL (ref 0.7–4.0)
MCH: 28.8 pg (ref 26.0–34.0)
MCHC: 34.4 g/dL (ref 30.0–36.0)
MCV: 83.7 fL (ref 80.0–100.0)
Monocytes Absolute: 0.9 10*3/uL (ref 0.1–1.0)
Monocytes Relative: 9 %
Neutro Abs: 7.8 10*3/uL — ABNORMAL HIGH (ref 1.7–7.7)
Neutrophils Relative %: 77 %
Platelets: 194 10*3/uL (ref 150–400)
RBC: 4.1 MIL/uL — ABNORMAL LOW (ref 4.22–5.81)
RDW: 13.5 % (ref 11.5–15.5)
WBC: 10.2 10*3/uL (ref 4.0–10.5)
nRBC: 0 % (ref 0.0–0.2)

## 2022-06-17 LAB — RENAL FUNCTION PANEL
Albumin: 2.8 g/dL — ABNORMAL LOW (ref 3.5–5.0)
Anion gap: 9 (ref 5–15)
BUN: 6 mg/dL (ref 6–20)
CO2: 21 mmol/L — ABNORMAL LOW (ref 22–32)
Calcium: 8.7 mg/dL — ABNORMAL LOW (ref 8.9–10.3)
Chloride: 103 mmol/L (ref 98–111)
Creatinine, Ser: 0.91 mg/dL (ref 0.61–1.24)
GFR, Estimated: >60 mL/min (ref >60)
Glucose, Bld: 96 mg/dL (ref 70–99)
Phosphorus: 2.6 mg/dL (ref 2.5–4.6)
Potassium: 3.7 mmol/L (ref 3.5–5.1)
Sodium: 133 mmol/L — ABNORMAL LOW (ref 135–145)

## 2022-06-17 LAB — MAGNESIUM: Magnesium: 2.1 mg/dL (ref 1.7–2.4)

## 2022-06-17 NOTE — Progress Notes (Signed)
PROGRESS NOTE  Troy Arellano [id number] DOB: 1965-11-19   PCP: [name], PA  Patient is from: Home.  Independently ambulates at baseline.  DOA: 06/15/2022 LOS: 1  Chief complaints Chief Complaint  Patient presents with   Leg Pain   Urinary Frequency     Brief Narrative / Interim history: 57 year old M with PMH of HTN, HLD, obesity, prediabetes and recent left foot revision triple arthrodesis presenting with fever, chills and increased frequency of urination and admitted for severe sepsis due to urinary tract infection.  Blood cultures with E. coli.  Sensitivity pending.    Subjective: Seen and examined earlier this morning.  No major events overnight of this morning.  Had episode of nausea yesterday.  Reports dysuria and frequency.  Denies abdominal pain or back pain.  Wife at bedside.  Objective: Vitals:   06/16/22 2244 06/17/22 0118 06/17/22 0524 06/17/22 0721  BP:   130/75 129/75  Pulse:   94 94  Resp:   20   Temp: 99.7 F (37.6 C) 98.8 F (37.1 C) 99.9 F (37.7 C) 99.1 F (37.3 C)  TempSrc: Axillary Oral Oral Oral  SpO2:   96% 98%  Weight:      Height:        Examination:   GENERAL: No apparent distress.  Nontoxic. HEENT: MMM.  Vision and hearing grossly intact.  NECK: Supple.  No apparent JVD.  RESP:  No IWOB.  Fair aeration bilaterally. CVS:  RRR. Heart sounds normal.  ABD/GI/GU: BS+. Abd soft, NTND.  MSK/EXT:   No apparent deformity.  Cast over left leg and left foot. SKIN: no apparent skin lesion or wound NEURO: Awake and alert. Oriented appropriately.  No apparent focal neuro deficit. PSYCH: Calm. Normal affect.   Procedures:  None  Microbiology summarized: [id number], influenza and RSV PCR nonreactive Blood cultures with E. coli. Urine culture pending  Assessment and plan: Principal Problem:   Sepsis secondary to UTI [facility]) Active Problems:   Obesity   Essential hypertension   Pre-diabetes   History of foot surgery   Severe sepsis  ([place])   E coli bacteremia   Severe sepsis due to E. coli bacteremia and urinary tract infection: POA.  Had fever, tachycardia, tachypnea, leukocytosis and lactic acidosis.  No recent catheterization.  Sepsis physiology resolved.  Blood culture with E. coli. -Continue ceftriaxone to 2 g given bacteremia. -Follow-up blood culture sensitivity and urine culture  History of foot surgery: recent left foot revision triple arthrodesis with hardware removal on 1/24 with Dr. Lucia Gaskins.  Lower extremity venous Doppler negative.  -Cast applied per recommendation by Dr. Lucia Gaskins -Continue follow-up outpatient with orthopedics   Pre-diabetes: A1c 6.1%.  Never over 6.3%.  Seems to be metformin at home.   Essential hypertension BP slightly elevated. -Continue HCTZ.  Hypokalemia/hypomagnesemia -Monitor replenish as appropriate  Mild hyponatremia: Likely due to HCTZ.  Continue monitoring   Obesity Body mass index is 34.46 kg/m.           DVT prophylaxis:  enoxaparin (LOVENOX) injection 40 mg Start: 06/15/22 2100  Code Status: Full code Family Communication: None at bedside Level of care: Med-Surg Status is: Inpatient The patient will remain inpatient because: Due to severe sepsis, bacteremia and complicated UTI   Final disposition: Home Consultants:  Orthopedic  35 minutes with more than 50% spent in reviewing records, counseling patient/family and coordinating care.   Sch Meds:  Scheduled Meds:  enoxaparin (LOVENOX) injection  40 mg Subcutaneous Q24H   hydrochlorothiazide  25 mg Oral  Daily   Continuous Infusions:  cefTRIAXone (ROCEPHIN)  IV 2 g (06/16/22 2047)   PRN Meds:.acetaminophen, HYDROmorphone (DILAUDID) injection, labetalol, methocarbamol, ondansetron (ZOFRAN) IV, oxyCODONE  Antimicrobials: Anti-infectives (From admission, onward)    Start     Dose/Rate Route Frequency Ordered Stop   06/16/22 2200  cefTRIAXone (ROCEPHIN) 2 g in sodium chloride 0.9 % 100 mL IVPB        2  g 200 mL/hr over 30 Minutes Intravenous Every 24 hours 06/16/22 0640     06/16/22 2100  cefTRIAXone (ROCEPHIN) 1 g in sodium chloride 0.9 % 100 mL IVPB  Status:  Discontinued        1 g 200 mL/hr over 30 Minutes Intravenous Every 24 hours 06/15/22 2046 06/16/22 0640   06/15/22 1945  cefTRIAXone (ROCEPHIN) 2 g in sodium chloride 0.9 % 100 mL IVPB        2 g 200 mL/hr over 30 Minutes Intravenous  Once 06/15/22 1931 06/15/22 2204        I have personally reviewed the following labs and images: CBC: Recent Labs  Lab 06/15/22 1708 06/16/22 0101 06/17/22 0332  WBC 11.0* 8.6 10.2  NEUTROABS 9.4*  --  7.8*  HGB 13.0 11.4* 11.8*  HCT 37.8* 34.1* 34.3*  MCV 84.0 84.8 83.7  PLT 257 201 194   BMP &GFR Recent Labs  Lab 06/15/22 1708 06/16/22 0101 06/17/22 0332  NA 133* 137 133*  K 3.4* 3.2* 3.7  CL 101 106 103  CO2 21* 21* 21*  GLUCOSE 115* 141* 96  BUN 8 7 6  $ CREATININE 0.98 1.12 0.91  CALCIUM 9.1 8.4* 8.7*  MG  --  1.6* 2.1  PHOS  --   --  2.6   Estimated Creatinine Clearance: 103.3 mL/min (by C-G formula based on SCr of 0.91 mg/dL). Liver & Pancreas: Recent Labs  Lab 06/15/22 1708 06/17/22 0332  AST 27  --   ALT 26  --   ALKPHOS 68  --   BILITOT 0.7  --   PROT 8.0  --   ALBUMIN 3.4* 2.8*   No results for input(s): "LIPASE", "AMYLASE" in the last 168 hours. No results for input(s): "AMMONIA" in the last 168 hours. Diabetic: Recent Labs    06/16/22 0101  HGBA1C 6.1*   Recent Labs  Lab 06/15/22 1700  GLUCAP 116*   Cardiac Enzymes: No results for input(s): "CKTOTAL", "CKMB", "CKMBINDEX", "TROPONINI" in the last 168 hours. No results for input(s): "PROBNP" in the last 8760 hours. Coagulation Profile: Recent Labs  Lab 06/15/22 1708  INR 1.0   Thyroid Function Tests: No results for input(s): "TSH", "T4TOTAL", "FREET4", "T3FREE", "THYROIDAB" in the last 72 hours. Lipid Profile: No results for input(s): "CHOL", "HDL", "LDLCALC", "TRIG", "CHOLHDL",  "LDLDIRECT" in the last 72 hours. Anemia Panel: No results for input(s): "VITAMINB12", "FOLATE", "FERRITIN", "TIBC", "IRON", "RETICCTPCT" in the last 72 hours. Urine analysis:    Component Value Date/Time   COLORURINE YELLOW 06/15/2022 1658   APPEARANCEUR HAZY (A) 06/15/2022 1658   LABSPEC 1.017 06/15/2022 1658   PHURINE 7.0 06/15/2022 1658   GLUCOSEU NEGATIVE 06/15/2022 1658   GLUCOSEU NEGATIVE 05/28/2021 1151   HGBUR MODERATE (A) 06/15/2022 1658   BILIRUBINUR NEGATIVE 06/15/2022 1658   KETONESUR NEGATIVE 06/15/2022 1658   PROTEINUR 30 (A) 06/15/2022 1658   UROBILINOGEN 0.2 05/28/2021 1151   NITRITE POSITIVE (A) 06/15/2022 1658   LEUKOCYTESUR LARGE (A) 06/15/2022 1658   Sepsis Labs: Invalid input(s): "PROCALCITONIN", "LACTICIDVEN"  Microbiology: Recent Results (from the past  240 hour(s))  Culture, blood (Routine x 2)     Status: None (Preliminary result)   Collection Time: 06/15/22  5:03 PM   Specimen: BLOOD  Result Value Ref Range Status   Specimen Description BLOOD SITE NOT SPECIFIED  Final   Special Requests   Final    BOTTLES DRAWN AEROBIC AND ANAEROBIC Blood Culture adequate volume   Culture  Setup Time   Final    AEROBIC BOTTLE ONLY GRAM NEGATIVE RODS CRITICAL VALUE NOTED.  VALUE IS CONSISTENT WITH PREVIOUSLY REPORTED AND CALLED VALUE. Performed at [facility], [place] [address] [place], [place] [address]    Culture GRAM NEGATIVE RODS  Final   Report Status PENDING  Incomplete  Culture, blood (Routine x 2)     Status: Abnormal (Preliminary result)   Collection Time: 06/15/22  5:08 PM   Specimen: BLOOD  Result Value Ref Range Status   Specimen Description BLOOD RIGHT ANTECUBITAL  Final   Special Requests   Final    BOTTLES DRAWN AEROBIC AND ANAEROBIC Blood Culture adequate volume   Culture  Setup Time   Final    GRAM NEGATIVE RODS IN BOTH AEROBIC AND ANAEROBIC BOTTLES CRITICAL RESULT CALLED TO, READ BACK BY AND VERIFIED WITH: PHARMD L.BELL AT 0804 ON  06/16/2022 BY T.SAAD.    Culture (A)  Final    ESCHERICHIA COLI SUSCEPTIBILITIES TO FOLLOW Performed at [facility], [place] [address], [place], [place] [address]    Report Status PENDING  Incomplete  Blood Culture ID Panel (Reflexed)     Status: Abnormal   Collection Time: 06/15/22  5:08 PM  Result Value Ref Range Status   Enterococcus faecalis NOT DETECTED NOT DETECTED Final   Enterococcus Faecium NOT DETECTED NOT DETECTED Final   Listeria monocytogenes NOT DETECTED NOT DETECTED Final   Staphylococcus species NOT DETECTED NOT DETECTED Final   Staphylococcus aureus (BCID) NOT DETECTED NOT DETECTED Final   Staphylococcus epidermidis NOT DETECTED NOT DETECTED Final   Staphylococcus lugdunensis NOT DETECTED NOT DETECTED Final   Streptococcus species NOT DETECTED NOT DETECTED Final   Streptococcus agalactiae NOT DETECTED NOT DETECTED Final   Streptococcus pneumoniae NOT DETECTED NOT DETECTED Final   Streptococcus pyogenes NOT DETECTED NOT DETECTED Final   A.calcoaceticus-baumannii NOT DETECTED NOT DETECTED Final   Bacteroides fragilis NOT DETECTED NOT DETECTED Final   Enterobacterales DETECTED (A) NOT DETECTED Final    Comment: Enterobacterales represent a large order of gram negative bacteria, not a single organism. CRITICAL RESULT CALLED TO, READ BACK BY AND VERIFIED WITH: PHARMD L.BELL AT 0804 ON 06/16/2022 BY T.SAAD.    Enterobacter cloacae complex NOT DETECTED NOT DETECTED Final   Escherichia coli DETECTED (A) NOT DETECTED Final    Comment: CRITICAL RESULT CALLED TO, READ BACK BY AND VERIFIED WITH: PHARMD L.BELL AT 0804 ON 06/16/2022 BY T.SAAD.    Klebsiella aerogenes NOT DETECTED NOT DETECTED Final   Klebsiella oxytoca NOT DETECTED NOT DETECTED Final   Klebsiella pneumoniae NOT DETECTED NOT DETECTED Final   Proteus species NOT DETECTED NOT DETECTED Final   Salmonella species NOT DETECTED NOT DETECTED Final   Serratia marcescens NOT DETECTED NOT DETECTED Final    Haemophilus influenzae NOT DETECTED NOT DETECTED Final   Neisseria meningitidis NOT DETECTED NOT DETECTED Final   Pseudomonas aeruginosa NOT DETECTED NOT DETECTED Final   Stenotrophomonas maltophilia NOT DETECTED NOT DETECTED Final   Candida albicans NOT DETECTED NOT DETECTED Final   Candida auris NOT DETECTED NOT DETECTED Final   Candida  glabrata NOT DETECTED NOT DETECTED Final   Candida krusei NOT DETECTED NOT DETECTED Final   Candida parapsilosis NOT DETECTED NOT DETECTED Final   Candida tropicalis NOT DETECTED NOT DETECTED Final   Cryptococcus neoformans/gattii NOT DETECTED NOT DETECTED Final   CTX-M ESBL NOT DETECTED NOT DETECTED Final   Carbapenem resistance IMP NOT DETECTED NOT DETECTED Final   Carbapenem resistance KPC NOT DETECTED NOT DETECTED Final   Carbapenem resistance NDM NOT DETECTED NOT DETECTED Final   Carbapenem resist OXA 48 LIKE NOT DETECTED NOT DETECTED Final   Carbapenem resistance VIM NOT DETECTED NOT DETECTED Final    Comment: Performed at [facility], [place] [address] [place], [place] [address]  Resp panel by RT-PCR (RSV, Flu A&B, Covid) Anterior Nasal Swab     Status: None   Collection Time: 06/15/22  6:53 PM   Specimen: Anterior Nasal Swab  Result Value Ref Range Status   SARS Coronavirus 2 by RT PCR NEGATIVE NEGATIVE Final   Influenza A by PCR NEGATIVE NEGATIVE Final   Influenza B by PCR NEGATIVE NEGATIVE Final    Comment: (NOTE) The Xpert Xpress SARS-CoV-2/FLU/RSV plus assay is intended as an aid in the diagnosis of influenza from Nasopharyngeal swab specimens and should not be used as a sole basis for treatment. Nasal washings and aspirates are unacceptable for Xpert Xpress SARS-CoV-2/FLU/RSV testing.  Fact Sheet for Patients: EntrepreneurPulse.com.au  Fact Sheet for Healthcare Providers: IncredibleEmployment.be  This test is not yet approved or cleared by the [place] FDA and has been authorized for  detection and/or diagnosis of SARS-CoV-2 by FDA under an Emergency Use Authorization (EUA). This EUA will remain in effect (meaning this test can be used) for the duration of the COVID-19 declaration under Section 564(b)(1) of the Act, 21 U.S.C. section 360bbb-3(b)(1), unless the authorization is terminated or revoked.     Resp Syncytial Virus by PCR NEGATIVE NEGATIVE Final    Comment: (NOTE) Fact Sheet for Patients: EntrepreneurPulse.com.au  Fact Sheet for Healthcare Providers: IncredibleEmployment.be  This test is not yet approved or cleared by the [place] FDA and has been authorized for detection and/or diagnosis of SARS-CoV-2 by FDA under an Emergency Use Authorization (EUA). This EUA will remain in effect (meaning this test can be used) for the duration of the COVID-19 declaration under Section 564(b)(1) of the Act, 21 U.S.C. section 360bbb-3(b)(1), unless the authorization is terminated or revoked.  Performed at [facility], [place] [address] [place], [place] [address]     Radiology Studies: No results found.    Dyer Klug T. E[place]  If 7PM-7AM, please contact night-coverage [email or web address] 06/17/2022, 2:14 PM

## 2022-06-17 NOTE — TOC Initial Note (Signed)
Transition of Care [facility]) - Initial/Assessment Note    Patient Details  Name: Troy Arellano MRN: [id number] Date of Birth: [birth year]  Transition [facility] CM/SW Contact:    Ninfa Meeker, RN Phone Number: 06/17/2022, 8:41 AM  Clinical Narrative:     Transition of Care Screening Note:              Patient is 57 yr old male, from home with sepsis secondary to UTI. On IV antibiotics. Independent prior to admission.   Transition of Care [facility]) Department has reviewed patient and no TOC needs have been identified at this time. We will continue to monitor patient advancement through Interdisciplinary progressions and if new patient needs arise, please place a consult.         Patient Goals and CMS Choice            Expected Discharge Plan and Services                                              Prior Living Arrangements/Services                       Activities of Daily Living Home Assistive Devices/Equipment: Gilford Rile (specify type) ADL Screening (condition at time of admission) Patient's cognitive ability adequate to safely complete daily activities?: Yes Is the patient deaf or have difficulty hearing?: No Does the patient have difficulty seeing, even when wearing glasses/contacts?: No Does the patient have difficulty concentrating, remembering, or making decisions?: No Patient able to express need for assistance with ADLs?: Yes Does the patient have difficulty dressing or bathing?: No Independently performs ADLs?: Yes (appropriate for developmental age) Does the patient have difficulty walking or climbing stairs?: No Weakness of Legs: None Weakness of Arms/Hands: None  Permission Sought/Granted                  Emotional Assessment              Admission diagnosis:  Acute cystitis without hematuria [N30.00] Sepsis ([place]) [A41.9] Sepsis, due to unspecified organism, unspecified whether acute organ dysfunction present ([place])  [A41.9] Severe sepsis ([place]) [A41.9, R65.20] Patient Active Problem List   Diagnosis Date Noted   Severe sepsis ([place]) 06/16/2022   E coli bacteremia 06/16/2022   Sepsis secondary to UTI ([place]) 06/15/2022   Pre-diabetes 06/15/2022   History of foot surgery 06/15/2022   Left foot pain 12/25/2021   Intractable pain 12/25/2021   Post-operative pain 12/25/2021   Nonintractable headache 10/18/2020   Hyperlipidemia 10/11/2019   Insulin resistance 10/11/2019   Chronic right shoulder pain 08/30/2019   Obesity 08/30/2019   Essential hypertension 08/30/2019   PCP:  [name], PA Pharmacy:   [id number] [id number]: [phone]: [phone]    Social Determinants of Health (SDOH) Social History: SDOH Screenings   Food Insecurity: No Food Insecurity (06/16/2022)  Housing: Low Risk  (06/16/2022)  Transportation Needs: No Transportation Needs (06/16/2022)  Utilities: Not At Risk (06/16/2022)  Depression (PHQ2-9): Low Risk  (10/10/2021)  Tobacco Use: Low Risk  (06/15/2022)   SDOH Interventions:     Readmission Risk Interventions    12/27/2021    8:16 AM  Readmission Risk Prevention Plan  Post Dischage Appt Complete  Medication Screening Complete  Transportation Screening Complete

## 2022-06-17 NOTE — Plan of Care (Signed)
  Problem: Pain Managment: Goal: General experience of comfort will improve Outcome: Progressing   Problem: Coping: Goal: Level of anxiety will decrease Outcome: Progressing   Problem: Activity: Goal: Risk for activity intolerance will decrease Outcome: Progressing

## 2022-06-18 ENCOUNTER — Telehealth: Payer: Self-pay | Admitting: Physician Assistant

## 2022-06-18 DIAGNOSIS — E669 Obesity, unspecified: Secondary | ICD-10-CM | POA: Diagnosis not present

## 2022-06-18 DIAGNOSIS — Z9889 Other specified postprocedural states: Secondary | ICD-10-CM | POA: Diagnosis not present

## 2022-06-18 DIAGNOSIS — R7303 Prediabetes: Secondary | ICD-10-CM | POA: Diagnosis not present

## 2022-06-18 DIAGNOSIS — A419 Sepsis, unspecified organism: Secondary | ICD-10-CM | POA: Diagnosis not present

## 2022-06-18 LAB — CULTURE, BLOOD (ROUTINE X 2)
Special Requests: ADEQUATE
Special Requests: ADEQUATE

## 2022-06-18 MED ORDER — LEVOFLOXACIN 500 MG PO TABS
750.0000 mg | ORAL_TABLET | Freq: Every day | ORAL | Status: DC
  Administered 2022-06-18: 750 mg via ORAL
  Filled 2022-06-18: qty 2

## 2022-06-18 MED ORDER — LEVOFLOXACIN 750 MG PO TABS: 750.0000 mg | ORAL_TABLET | Freq: Every day | ORAL | 0 refills | Status: DC

## 2022-06-18 MED ORDER — ACETAMINOPHEN 325 MG PO TABS: 650.0000 mg | ORAL_TABLET | Freq: Four times a day (QID) | ORAL | Status: AC | PRN

## 2022-06-18 NOTE — Telephone Encounter (Signed)
Patient requests to be called regarding: Patient states he has been hospitalized at [facility] since 06/15/22 for bacterial infection. Patient states he wants to make sure his PCP knows what symptoms he is still having before being released from Hospital.

## 2022-06-18 NOTE — Progress Notes (Signed)
     Troy Arellano is a 57 y.o. male   Orthopaedic diagnosis: Status post left foot revision triple arthrodesis nearly 4 weeks ago  Subjective: Patient is resting comfortably.  He is receiving antibiotic treatment for his E. coli sepsis and complicated UTI.  He he complains of discomfort with the cath that was replaced.  He is having some forefoot swelling due to cast tightness and some knee pain.  Denies worsening pain in his foot and ankle.  Objectyive: Vitals:   06/18/22 0500 06/18/22 0805  BP: 131/75 130/80  Pulse: 83 89  Resp: 18   Temp: 97.9 F (36.6 C) 98.6 F (37 C)  SpO2: 96% 97%     Exam: Awake and alert Respirations even and unlabored No acute distress  Left leg shows an ill fitting cast.  Toes exposed demonstrate mild swelling with some wrinkling.  Intact sensation about the forefoot.  No knee effusion or swelling.  Assessment: Status post left foot revision triple arthrodesis nearly 4 weeks ago complicated by E. coli bacteremia and complicated UTI   Plan: We will asked the cast tech to remove the cast and will replace either cast or splint with our assistance. He is to continue nonweightbearing on the left lower extremity I did stress the importance of adequate treatment for his sepsis and complicated UTI due to the risk of seeding his surgical site and creating an infection that would limit his ability to heal the arthrodesis and necessitate further surgeries.  He understands the severity of this condition and is in agreement with obtaining adequate treatment for this infection.  Currently I do not think that his foot is infected.  Patient will continue treatment per hospitalist team. We will follow at a distance and plan for follow-up as normally scheduled intervals.     Radene Journey, MD

## 2022-06-18 NOTE — Progress Notes (Signed)
    Patient's poorly fitted cast was removed uneventfully.  Surgical incisions healing very well with sutures in place.  There is no evidence of infection.  Mild appropriate postoperative swelling about hindfoot.  New dressing was placed and a nonweightbearing short leg splint was applied.  He tolerated this well and said it felt much better.  Remained neurovascular intact distally following application of the splint.  He will follow-up in the office outpatient as scheduled for reevaluation.   Ayub Kirsh J. [place], PA-C

## 2022-06-18 NOTE — Progress Notes (Signed)
Orthopedic Tech Progress Note Patient Details:  Troy Arellano 10-09-1965 [id number] Assisted PA with removal of cast and application of short leg splint.  Ortho Devices Type of Ortho Device: Short leg splint Ortho Device/Splint Location: LLE Ortho Device/Splint Interventions: Application   Post Interventions Patient Tolerated: Well Instructions Provided: Care of device, Adjustment of device  Troy Arellano A Troy Arellano 06/18/2022, 12:18 PM

## 2022-06-18 NOTE — Discharge Summary (Signed)
Physician Discharge Summary  Troy Arellano [id number] DOB: Aug 03, 1965 DOA: 06/15/2022  PCP: [name], PA  Admit date: 06/15/2022 Discharge date: 06/18/2022 Admitted From: Home Disposition: Home Recommendations for Outpatient Follow-up:  Follow up with PCP in 1 to 2 weeks or sooner if needed Outpatient follow-up with orthopedic surgery as previously planned Check BMP and CBC at follow-up Please follow up on the following pending results: Urine culture  Home Health: Not indicated Equipment/Devices: Not indicated  Discharge Condition: Stable CODE STATUS: Full code  Follow-up Information     [name], [place]. Schedule an appointment as soon as possible for a visit in 1 week(s).   Specialty: Physician Assistant Contact information: [place] [address] [phone]                 Hospital course 57 year old M with PMH of HTN, HLD, obesity, prediabetes and recent left foot revision triple arthrodesis presenting with fever, chills and increased frequency of urination and admitted for severe sepsis due to urinary tract infection.  Blood cultures with E. coli.   Sepsis physiology resolved.  Patient was transitioned to p.o. Levaquin for 5 more days to complete a total of 7 days course, and discharge.  Urine culture pending at time of discharge but expect to grow E. coli.  Of note, patient was evaluated by orthopedic surgery and had left lower extremity cast changed.   See individual problem list below for more.   Problems addressed during this hospitalization Principal Problem:   Sepsis secondary to UTI [facility]) Active Problems:   Obesity   Essential hypertension   Pre-diabetes   History of foot surgery   Severe sepsis ([place])   E coli bacteremia              Vital signs Vitals:   06/17/22 1506 06/17/22 2043 06/18/22 0500 06/18/22 0805  BP: 127/71 130/85 131/75 130/80  Pulse: 76 85 83 89  Temp: 99.3 F (37.4 C) 99 F (37.2 C)  97.9 F (36.6 C) 98.6 F (37 C)  Resp:  20 18   Height:      Weight:      SpO2: 98% 98% 96% 97%  TempSrc: Oral Oral Oral Oral  BMI (Calculated):         Discharge exam  GENERAL: No apparent distress.  Nontoxic. HEENT: MMM.  Vision and hearing grossly intact.  NECK: Supple.  No apparent JVD.  RESP:  No IWOB.  Fair aeration bilaterally. CVS:  RRR. Heart sounds normal.  ABD/GI/GU: BS+. Abd soft, NTND.  MSK/EXT:  Moves extremities.  Cast over left lower leg and left foot. SKIN: no apparent skin lesion or wound NEURO: Awake and alert. Oriented appropriately.  No apparent focal neuro deficit. PSYCH: Calm. Normal affect.   Discharge Instructions Discharge Instructions     Call MD for:  temperature >100.4   Complete by: As directed    Diet - low sodium heart healthy   Complete by: As directed    Discharge instructions   Complete by: As directed    It has been a pleasure taking care of you!  You were hospitalized due to urinary tract infection and bloodstream infection for which you have been treated with IV antibiotics.  We are discharging you on oral antibiotics to complete treatment course.  It is very important that you complete the whole course of treatment regardless of improvement.  Please review your new medication list and the directions on your medications before you take them.  Follow-up with your primary care doctor in 1 to 2 weeks or sooner if needed.   Take care,   Increase activity slowly   Complete by: As directed       Allergies as of 06/18/2022   No Known Allergies      Medication List     STOP taking these medications    ibuprofen 800 MG tablet Commonly known as: ADVIL       TAKE these medications    acetaminophen 325 MG tablet Commonly known as: TYLENOL Take 2 tablets (650 mg total) by mouth every 6 (six) hours as needed for fever or headache.   aspirin 325 MG tablet Commonly known as: Bayer Aspirin Take 1 tablet by mouth daily for 30 DAYS  for blood clot prevention   hydrochlorothiazide 25 MG tablet Commonly known as: HYDRODIURIL TAKE 1 TABLET BY MOUTH EVERY DAY What changed: when to take this   hydrOXYzine 25 MG capsule Commonly known as: VISTARIL TAKE 1 CAPSULE (25 MG TOTAL) BY MOUTH AT BEDTIME AS NEEDED (SLEEP).   levofloxacin 750 MG tablet Commonly known as: Levaquin Take 1 tablet (750 mg total) by mouth daily for 5 days. Start taking on: June 19, 2022   metFORMIN 500 MG tablet Commonly known as: GLUCOPHAGE Take 1 tablet (500 mg total) by mouth daily with breakfast.   methocarbamol 500 MG tablet Commonly known as: ROBAXIN Take 1 tablet (500 mg total) by mouth every 6 (six) hours as needed for muscle spasms.   oxyCODONE 5 MG immediate release tablet Commonly known as: Roxicodone Take 1-2 tablets by mouth every 4-6 hours as needed for post op pain        Consultations: Orthopedic surgery  Procedures/Studies:   VAS [place] LOWER EXTREMITY VENOUS (DVT)  Result Date: 06/16/2022  Lower Venous DVT Study Patient Name:  Troy Arellano  Date of Exam:   06/15/2022 Medical Rec #: [id number]    Accession #:    [id number] Date of Birth: 28-Jan-1966    Patient Gender: M Patient Age:   73 years Exam Location:  [facility] Procedure:      VAS [place] LOWER EXTREMITY VENOUS (DVT) Referring Phys: ADAM CURATOLO --------------------------------------------------------------------------------  Indications: Pain, and Swelling.  Comparison Study: No prior study on file Performing Technologist: Sharion Dove RVS  Examination Guidelines: A complete evaluation includes B-mode imaging, spectral Doppler, color Doppler, and power Doppler as needed of all accessible portions of each vessel. Bilateral testing is considered an integral part of a complete examination. Limited examinations for reoccurring indications may be performed as noted. The reflux portion of the exam is performed with the patient in reverse Trendelenburg.   +-----+---------------+---------+-----------+----------+--------------+ RIGHTCompressibilityPhasicitySpontaneityPropertiesThrombus Aging +-----+---------------+---------+-----------+----------+--------------+ CFV  Full           Yes      Yes                                 +-----+---------------+---------+-----------+----------+--------------+ SFJ  Full                                                        +-----+---------------+---------+-----------+----------+--------------+   +---------+---------------+---------+-----------+----------+--------------+ LEFT     CompressibilityPhasicitySpontaneityPropertiesThrombus Aging +---------+---------------+---------+-----------+----------+--------------+ CFV      Full           Yes  Yes                                 +---------+---------------+---------+-----------+----------+--------------+ SFJ      Full                                                        +---------+---------------+---------+-----------+----------+--------------+ FV Prox  Full                                                        +---------+---------------+---------+-----------+----------+--------------+ FV Mid   Full                                                        +---------+---------------+---------+-----------+----------+--------------+ FV DistalFull                                                        +---------+---------------+---------+-----------+----------+--------------+ PFV      Full                                                        +---------+---------------+---------+-----------+----------+--------------+ POP      Full           Yes      Yes                                 +---------+---------------+---------+-----------+----------+--------------+ PTV      Full                                                        +---------+---------------+---------+-----------+----------+--------------+  PERO     Full                                                        +---------+---------------+---------+-----------+----------+--------------+    Summary: RIGHT: - No evidence of common femoral vein obstruction.  LEFT: - There is no evidence of deep vein thrombosis in the lower extremity.  - No cystic structure found in the popliteal fossa.  *See table(s) above for measurements and observations. Electronically signed by Harold Barban MD on 06/16/2022 at 8:23:37 PM.    Final    DG Chest 2 View  Result Date: 06/15/2022 CLINICAL DATA:  Suspected sepsis. EXAM:  CHEST - 2 VIEW COMPARISON:  05/24/2021. FINDINGS: Cardiac silhouette normal in size. Normal mediastinal and hilar contours. Clear lungs.  No convincing pleural effusion or pneumothorax. Skeletal structures are grossly intact. IMPRESSION: No active cardiopulmonary disease. Electronically Signed   By: Lajean Manes M.D.   On: 06/15/2022 17:42   DG Ankle Complete Left  Result Date: 05/28/2022 CLINICAL DATA:  Hardware removal. EXAM: LEFT ANKLE COMPLETE - 3+ VIEW COMPARISON:  CT left foot dated April 25, 2022. FLUOROSCOPY TIME:  Radiation Exposure Index (as provided by the fluoroscopic device): 1.59 mGy Kerma C-arm fluoroscopic images were obtained intraoperatively and submitted for post operative interpretation. FINDINGS: Multiple intraoperative fluoroscopic images demonstrate interval revision of the subtalar, talonavicular, and calcaneocuboid fusion. No acute osseous abnormality. IMPRESSION: 1. Intraoperative fluoroscopic guidance for triple arthrodesis revision. Electronically Signed   By: Titus Dubin M.D.   On: 05/28/2022 11:26   DG C-Arm 1-60 Min-No Report  Result Date: 05/28/2022 Fluoroscopy was utilized by the requesting physician.  No radiographic interpretation.   DG C-Arm 1-60 Min-No Report  Result Date: 05/28/2022 Fluoroscopy was utilized by the requesting physician.  No radiographic interpretation.   DG C-Arm 1-60 Min-No  Report  Result Date: 05/28/2022 Fluoroscopy was utilized by the requesting physician.  No radiographic interpretation.       The results of significant diagnostics from this hospitalization (including imaging, microbiology, ancillary and laboratory) are listed below for reference.     Microbiology: Recent Results (from the past 240 hour(s))  Culture, blood (Routine x 2)     Status: Abnormal   Collection Time: 06/15/22  5:03 PM   Specimen: BLOOD  Result Value Ref Range Status   Specimen Description BLOOD SITE NOT SPECIFIED  Final   Special Requests   Final    BOTTLES DRAWN AEROBIC AND ANAEROBIC Blood Culture adequate volume   Culture  Setup Time   Final    AEROBIC BOTTLE ONLY GRAM NEGATIVE RODS CRITICAL VALUE NOTED.  VALUE IS CONSISTENT WITH PREVIOUSLY REPORTED AND CALLED VALUE.    Culture (A)  Final    ESCHERICHIA COLI SUSCEPTIBILITIES PERFORMED ON PREVIOUS CULTURE WITHIN THE LAST 5 DAYS. Performed at [facility], [place] [address] [place], [place] [address]    Report Status 06/18/2022 FINAL  Final  Culture, blood (Routine x 2)     Status: Abnormal   Collection Time: 06/15/22  5:08 PM   Specimen: BLOOD  Result Value Ref Range Status   Specimen Description BLOOD RIGHT ANTECUBITAL  Final   Special Requests   Final    BOTTLES DRAWN AEROBIC AND ANAEROBIC Blood Culture adequate volume   Culture  Setup Time   Final    GRAM NEGATIVE RODS IN BOTH AEROBIC AND ANAEROBIC BOTTLES CRITICAL RESULT CALLED TO, READ BACK BY AND VERIFIED WITH: PHARMD L.BELL AT 0804 ON 06/16/2022 BY T.SAAD. Performed at [facility], [place] [address] [place], [place] [address]    Culture ESCHERICHIA COLI (A)  Final   Report Status 06/18/2022 FINAL  Final   Organism ID, Bacteria ESCHERICHIA COLI  Final      Susceptibility   Escherichia coli - MIC*    AMPICILLIN <=2 SENSITIVE Sensitive     CEFEPIME <=0.12 SENSITIVE Sensitive     CEFTAZIDIME <=1 SENSITIVE Sensitive     CEFTRIAXONE <=0.25  SENSITIVE Sensitive     CIPROFLOXACIN <=0.25 SENSITIVE Sensitive     GENTAMICIN <=1 SENSITIVE Sensitive     IMIPENEM <=0.25 SENSITIVE Sensitive     TRIMETH/SULFA <=20 SENSITIVE  Sensitive     AMPICILLIN/SULBACTAM <=2 SENSITIVE Sensitive     PIP/TAZO <=4 SENSITIVE Sensitive     * ESCHERICHIA COLI  Blood Culture ID Panel (Reflexed)     Status: Abnormal   Collection Time: 06/15/22  5:08 PM  Result Value Ref Range Status   Enterococcus faecalis NOT DETECTED NOT DETECTED Final   Enterococcus Faecium NOT DETECTED NOT DETECTED Final   Listeria monocytogenes NOT DETECTED NOT DETECTED Final   Staphylococcus species NOT DETECTED NOT DETECTED Final   Staphylococcus aureus (BCID) NOT DETECTED NOT DETECTED Final   Staphylococcus epidermidis NOT DETECTED NOT DETECTED Final   Staphylococcus lugdunensis NOT DETECTED NOT DETECTED Final   Streptococcus species NOT DETECTED NOT DETECTED Final   Streptococcus agalactiae NOT DETECTED NOT DETECTED Final   Streptococcus pneumoniae NOT DETECTED NOT DETECTED Final   Streptococcus pyogenes NOT DETECTED NOT DETECTED Final   A.calcoaceticus-baumannii NOT DETECTED NOT DETECTED Final   Bacteroides fragilis NOT DETECTED NOT DETECTED Final   Enterobacterales DETECTED (A) NOT DETECTED Final    Comment: Enterobacterales represent a large order of gram negative bacteria, not a single organism. CRITICAL RESULT CALLED TO, READ BACK BY AND VERIFIED WITH: PHARMD L.BELL AT 0804 ON 06/16/2022 BY T.SAAD.    Enterobacter cloacae complex NOT DETECTED NOT DETECTED Final   Escherichia coli DETECTED (A) NOT DETECTED Final    Comment: CRITICAL RESULT CALLED TO, READ BACK BY AND VERIFIED WITH: PHARMD L.BELL AT 0804 ON 06/16/2022 BY T.SAAD.    Klebsiella aerogenes NOT DETECTED NOT DETECTED Final   Klebsiella oxytoca NOT DETECTED NOT DETECTED Final   Klebsiella pneumoniae NOT DETECTED NOT DETECTED Final   Proteus species NOT DETECTED NOT DETECTED Final   Salmonella species NOT  DETECTED NOT DETECTED Final   Serratia marcescens NOT DETECTED NOT DETECTED Final   Haemophilus influenzae NOT DETECTED NOT DETECTED Final   Neisseria meningitidis NOT DETECTED NOT DETECTED Final   Pseudomonas aeruginosa NOT DETECTED NOT DETECTED Final   Stenotrophomonas maltophilia NOT DETECTED NOT DETECTED Final   Candida albicans NOT DETECTED NOT DETECTED Final   Candida auris NOT DETECTED NOT DETECTED Final   Candida glabrata NOT DETECTED NOT DETECTED Final   Candida krusei NOT DETECTED NOT DETECTED Final   Candida parapsilosis NOT DETECTED NOT DETECTED Final   Candida tropicalis NOT DETECTED NOT DETECTED Final   Cryptococcus neoformans/gattii NOT DETECTED NOT DETECTED Final   CTX-M ESBL NOT DETECTED NOT DETECTED Final   Carbapenem resistance IMP NOT DETECTED NOT DETECTED Final   Carbapenem resistance KPC NOT DETECTED NOT DETECTED Final   Carbapenem resistance NDM NOT DETECTED NOT DETECTED Final   Carbapenem resist OXA 48 LIKE NOT DETECTED NOT DETECTED Final   Carbapenem resistance VIM NOT DETECTED NOT DETECTED Final    Comment: Performed at [facility], [address] [place], [place] [address]  Resp panel by RT-PCR (RSV, Flu A&B, Covid) Anterior Nasal Swab     Status: None   Collection Time: 06/15/22  6:53 PM   Specimen: Anterior Nasal Swab  Result Value Ref Range Status   SARS Coronavirus 2 by RT PCR NEGATIVE NEGATIVE Final   Influenza A by PCR NEGATIVE NEGATIVE Final   Influenza B by PCR NEGATIVE NEGATIVE Final    Comment: (NOTE) The Xpert Xpress SARS-CoV-2/FLU/RSV plus assay is intended as an aid in the diagnosis of influenza from Nasopharyngeal swab specimens and should not be used as a sole basis for treatment. Nasal washings and aspirates are unacceptable for Xpert Xpress SARS-CoV-2/FLU/RSV testing.  Fact Sheet for Patients: EntrepreneurPulse.com.au  Fact Sheet for Healthcare Providers: IncredibleEmployment.be  This test  is not yet approved or cleared by the [place] FDA and has been authorized for detection and/or diagnosis of SARS-CoV-2 by FDA under an Emergency Use Authorization (EUA). This EUA will remain in effect (meaning this test can be used) for the duration of the COVID-19 declaration under Section 564(b)(1) of the Act, 21 U.S.C. section 360bbb-3(b)(1), unless the authorization is terminated or revoked.     Resp Syncytial Virus by PCR NEGATIVE NEGATIVE Final    Comment: (NOTE) Fact Sheet for Patients: EntrepreneurPulse.com.au  Fact Sheet for Healthcare Providers: IncredibleEmployment.be  This test is not yet approved or cleared by the [place] FDA and has been authorized for detection and/or diagnosis of SARS-CoV-2 by FDA under an Emergency Use Authorization (EUA). This EUA will remain in effect (meaning this test can be used) for the duration of the COVID-19 declaration under Section 564(b)(1) of the Act, 21 U.S.C. section 360bbb-3(b)(1), unless the authorization is terminated or revoked.  Performed at [facility], [place] [address] [place], [place] [address]      Labs:  CBC: Recent Labs  Lab 06/15/22 1708 06/16/22 0101 06/17/22 0332  WBC 11.0* 8.6 10.2  NEUTROABS 9.4*  --  7.8*  HGB 13.0 11.4* 11.8*  HCT 37.8* 34.1* 34.3*  MCV 84.0 84.8 83.7  PLT 257 201 194   BMP &GFR Recent Labs  Lab 06/15/22 1708 06/16/22 0101 06/17/22 0332  NA 133* 137 133*  K 3.4* 3.2* 3.7  CL 101 106 103  CO2 21* 21* 21*  GLUCOSE 115* 141* 96  BUN 8 7 6  $ CREATININE 0.98 1.12 0.91  CALCIUM 9.1 8.4* 8.7*  MG  --  1.6* 2.1  PHOS  --   --  2.6   Estimated Creatinine Clearance: 103.3 mL/min (by C-G formula based on SCr of 0.91 mg/dL). Liver & Pancreas: Recent Labs  Lab 06/15/22 1708 06/17/22 0332  AST 27  --   ALT 26  --   ALKPHOS 68  --   BILITOT 0.7  --   PROT 8.0  --   ALBUMIN 3.4* 2.8*   No results for input(s): "LIPASE",  "AMYLASE" in the last 168 hours. No results for input(s): "AMMONIA" in the last 168 hours. Diabetic: Recent Labs    06/16/22 0101  HGBA1C 6.1*   Recent Labs  Lab 06/15/22 1700  GLUCAP 116*   Cardiac Enzymes: No results for input(s): "CKTOTAL", "CKMB", "CKMBINDEX", "TROPONINI" in the last 168 hours. No results for input(s): "PROBNP" in the last 8760 hours. Coagulation Profile: Recent Labs  Lab 06/15/22 1708  INR 1.0   Thyroid Function Tests: No results for input(s): "TSH", "T4TOTAL", "FREET4", "T3FREE", "THYROIDAB" in the last 72 hours. Lipid Profile: No results for input(s): "CHOL", "HDL", "LDLCALC", "TRIG", "CHOLHDL", "LDLDIRECT" in the last 72 hours. Anemia Panel: No results for input(s): "VITAMINB12", "FOLATE", "FERRITIN", "TIBC", "IRON", "RETICCTPCT" in the last 72 hours. Urine analysis:    Component Value Date/Time   COLORURINE YELLOW 06/15/2022 1658   APPEARANCEUR HAZY (A) 06/15/2022 1658   LABSPEC 1.017 06/15/2022 1658   PHURINE 7.0 06/15/2022 1658   GLUCOSEU NEGATIVE 06/15/2022 1658   GLUCOSEU NEGATIVE 05/28/2021 1151   HGBUR MODERATE (A) 06/15/2022 1658   BILIRUBINUR NEGATIVE 06/15/2022 1658   KETONESUR NEGATIVE 06/15/2022 1658   PROTEINUR 30 (A) 06/15/2022 1658   UROBILINOGEN 0.2 05/28/2021 1151   NITRITE POSITIVE (A) 06/15/2022 1658   LEUKOCYTESUR LARGE (A) 06/15/2022  1658   Sepsis Labs: Invalid input(s): "PROCALCITONIN", "LACTICIDVEN"   SIGNED:  Mercy Riding, MD  [facility] 06/18/2022, 1:05 PM

## 2022-06-18 NOTE — Progress Notes (Signed)
Discharge instructions were given to the pt. All questions answered.

## 2022-06-18 NOTE — Plan of Care (Signed)
  Problem: Education: Goal: Knowledge of General Education information will improve Description: Including pain rating scale, medication(s)/side effects and non-pharmacologic comfort measures Outcome: Progressing   Problem: Health Behavior/Discharge Planning: Goal: Ability to manage health-related needs will improve Outcome: Progressing   Problem: Clinical Measurements: Goal: Ability to maintain clinical measurements within normal limits will improve Outcome: Progressing Goal: Will remain free from infection Outcome: Progressing Goal: Diagnostic test results will improve Outcome: Progressing Goal: Respiratory complications will improve Outcome: Progressing Goal: Cardiovascular complication will be avoided Outcome: Progressing   Problem: Activity: Goal: Risk for activity intolerance will decrease Outcome: Progressing   Problem: Nutrition: Goal: Adequate nutrition will be maintained Outcome: Progressing   

## 2022-06-19 ENCOUNTER — Telehealth: Payer: Self-pay

## 2022-06-19 NOTE — Telephone Encounter (Signed)
Tried to contact voicemail box is full unable to leave message. Will try again later.

## 2022-06-19 NOTE — Transitions of Care (Post Inpatient/ED Visit) (Signed)
   06/19/2022  Name: Troy Arellano MRN: [id number] DOB: 1965/08/06  Today's TOC FU Call Status: Today's TOC FU Call Status:: Unsuccessul Call (1st Attempt) Unsuccessful Call (1st Attempt) Date: 06/19/22  Attempted to reach the patient regarding the most recent Inpatient/ED visit.  Follow Up Plan: Additional outreach attempts will be made to reach the patient to complete the Transitions of Care (Post Inpatient/ED visit) call.   [place] LPN [place] Advisor Direct Dial [phone]

## 2022-06-20 NOTE — Transitions of Care (Post Inpatient/ED Visit) (Signed)
   06/20/2022  Name: Troy Arellano MRN: [id number] DOB: [birth year]  Today's TOC FU Call Status: Today's TOC FU Call Status:: Unsuccessful Call (2nd Attempt) Unsuccessful Call (1st Attempt) Date: 06/19/22 Unsuccessful Call (2nd Attempt) Date: 06/20/22  Attempted to reach the patient regarding the most recent Inpatient/ED visit.  Follow Up Plan: Additional outreach attempts will be made to reach the patient to complete the Transitions of Care (Post Inpatient/ED visit) call.   [place] LPN [place] Advisor Direct Dial [phone]

## 2022-06-24 ENCOUNTER — Encounter: Payer: Self-pay | Admitting: Physician Assistant

## 2022-06-24 ENCOUNTER — Ambulatory Visit (INDEPENDENT_AMBULATORY_CARE_PROVIDER_SITE_OTHER): Payer: BC Managed Care – PPO | Admitting: Physician Assistant

## 2022-06-24 VITALS — BP 138/76 | HR 78 | Temp 97.3°F | Ht 67.0 in | Wt 215.0 lb

## 2022-06-24 DIAGNOSIS — N390 Urinary tract infection, site not specified: Secondary | ICD-10-CM | POA: Diagnosis not present

## 2022-06-24 DIAGNOSIS — A419 Sepsis, unspecified organism: Secondary | ICD-10-CM | POA: Diagnosis not present

## 2022-06-24 DIAGNOSIS — R7303 Prediabetes: Secondary | ICD-10-CM

## 2022-06-24 LAB — COMPREHENSIVE METABOLIC PANEL
ALT: 45 U/L (ref 0–53)
AST: 32 U/L (ref 0–37)
Albumin: 3.9 g/dL (ref 3.5–5.2)
Alkaline Phosphatase: 75 U/L (ref 39–117)
BUN: 15 mg/dL (ref 6–23)
CO2: 24 mEq/L (ref 19–32)
Calcium: 9.7 mg/dL (ref 8.4–10.5)
Chloride: 101 mEq/L (ref 96–112)
Creatinine, Ser: 1.11 mg/dL (ref 0.40–1.50)
GFR: 74.53 mL/min (ref >60.00)
Glucose, Bld: 97 mg/dL (ref 70–99)
Potassium: 3.8 mEq/L (ref 3.5–5.1)
Sodium: 135 mEq/L (ref 135–145)
Total Bilirubin: 0.5 mg/dL (ref 0.2–1.2)
Total Protein: 8 g/dL (ref 6.0–8.3)

## 2022-06-24 LAB — CBC WITH DIFFERENTIAL/PLATELET
Basophils Absolute: 0 10*3/uL (ref 0.0–0.1)
Basophils Relative: 0.5 % (ref 0.0–3.0)
Eosinophils Absolute: 0.1 10*3/uL (ref 0.0–0.7)
Eosinophils Relative: 1.8 % (ref 0.0–5.0)
HCT: 37.9 % — ABNORMAL LOW (ref 39.0–52.0)
Hemoglobin: 12.7 g/dL — ABNORMAL LOW (ref 13.0–17.0)
Lymphocytes Relative: 51.9 % — ABNORMAL HIGH (ref 12.0–46.0)
Lymphs Abs: 2 10*3/uL (ref 0.7–4.0)
MCHC: 33.5 g/dL (ref 30.0–36.0)
MCV: 85.2 fl (ref 78.0–100.0)
Monocytes Absolute: 0.3 10*3/uL (ref 0.1–1.0)
Monocytes Relative: 7.3 % (ref 3.0–12.0)
Neutro Abs: 1.5 10*3/uL (ref 1.4–7.7)
Neutrophils Relative %: 38.5 % — ABNORMAL LOW (ref 43.0–77.0)
Platelets: 356 10*3/uL (ref 150.0–400.0)
RBC: 4.44 Mil/uL (ref 4.22–5.81)
RDW: 13.8 % (ref 11.5–15.5)
WBC: 3.8 10*3/uL — ABNORMAL LOW (ref 4.0–10.5)

## 2022-06-24 NOTE — Progress Notes (Signed)
Troy Arellano is a 57 y.o. male here for a new problem.  History of Present Illness:   Chief Complaint  Patient presents with   Hospitalization Follow-up    HPI  Sepsis hospitalization follow-up Patient went to ER with fever, chills, increased urination Blood cultures positive for E coli Received Rocephin and transitioned to levaquin on discharge. Based on my chart review, patient had foley catheter placed on 05/28/22 surgery. He is overall feeling improved since his last hospitalization.  Has seen uro in the past Most recently 06/27/21 for bladder irregularity -- was given a script for flomax and told to follow-up in 6 months. I do not see where he has followed up with them.  Insulin resistance 12 month follow-up. Current DM meds: metformin 500 mg daily. Blood sugars at home are: not checked. Patient is compliant with medications. Denies: hypoglycemic or hyperglycemic episodes or symptoms. This patient's diabetes is complicated by HLD and HTN. He and his wife are wondering if he can stop his metformin.  Lab Results  Component Value Date   HGBA1C 6.1 (H) 06/16/2022       Past Medical History:  Diagnosis Date   Arthritis    Back   Benign localized prostatic hyperplasia with lower urinary tract symptoms (LUTS)    urologist--- dr Cain Sieve   Bilateral recurrent inguinal hernia    GERD (gastroesophageal reflux disease)    History of adenomatous polyp of colon    History of COVID-19 05/2020   per pt mild symptoms that resovled   Hyperlipidemia    Hypertension    followed by pcp   Type 2 diabetes mellitus ([place])    followed by pcp   (07-17-2021  per pt does not take check blood sugar )     Social History   Tobacco Use   Smoking status: Never   Smokeless tobacco: Never  Vaping Use   Vaping Use: Never used  Substance Use Topics   Alcohol use: Yes    Alcohol/week: 2.0 - 3.0 standard drinks of alcohol    Types: 2 - 3 Standard drinks or equivalent per week    Comment:  occasionally   Drug use: No    Past Surgical History:  Procedure Laterality Date   COLONOSCOPY  12/28/2020   by dr h. danis   FOOT ARTHRODESIS Left 05/28/2022   Procedure: REVISION TRIPLE ARTHRODESIS;  Surgeon: Erle Crocker, MD;  Location: [facility];  Service: Orthopedics;  Laterality: Left;   HARDWARE REMOVAL Left 05/28/2022   Procedure: DEEP ORTHOPEDIC HARDWARE REMOVAL, LEFT FOOT POSTERIOR, MEDIAL AND LATERAL;  Surgeon: Erle Crocker, MD;  Location: [facility];  Service: Orthopedics;  Laterality: Left;  LENGTH OF SURGERY: 180 MINUTES   INGUINAL HERNIA REPAIR Bilateral    right 1987 and left 1988   INGUINAL HERNIA REPAIR Bilateral 07/20/2021   Procedure: LAPAROSCOPIC RECURRENT BILATERAL INGUINAL HERNIA REPAIR WITH MESH;  Surgeon: Stechschulte, Nickola Major, MD;  Location: [facility];  Service: General;  Laterality: Bilateral;    Family History  Problem Relation Age of Onset   Hypertension Mother    Hypertension Father    Heart failure Father    Lymphoma Sister    Colon cancer Neg Hx    Esophageal cancer Neg Hx    Rectal cancer Neg Hx    Stomach cancer Neg Hx    Colon polyps Neg Hx     No Known Allergies  Current Medications:   Current Outpatient Medications:    acetaminophen (TYLENOL) 325 MG  tablet, Take 2 tablets (650 mg total) by mouth every 6 (six) hours as needed for fever or headache., Disp: , Rfl:    aspirin (BAYER ASPIRIN) 325 MG tablet, Take 1 tablet by mouth daily for 30 DAYS for blood clot prevention, Disp: 30 tablet, Rfl: 0   hydrochlorothiazide (HYDRODIURIL) 25 MG tablet, TAKE 1 TABLET BY MOUTH EVERY DAY (Patient taking differently: Take 25 mg by mouth every morning.), Disp: 90 tablet, Rfl: 0   hydrOXYzine (VISTARIL) 25 MG capsule, TAKE 1 CAPSULE (25 MG TOTAL) BY MOUTH AT BEDTIME AS NEEDED (SLEEP)., Disp: 90 capsule, Rfl: 1   metFORMIN (GLUCOPHAGE) 500 MG tablet, Take 1 tablet (500 mg total) by mouth daily with breakfast., Disp: 90 tablet, Rfl: 3    methocarbamol (ROBAXIN) 500 MG tablet, Take 1 tablet (500 mg total) by mouth every 6 (six) hours as needed for muscle spasms., Disp: 30 tablet, Rfl: 0   oxyCODONE (ROXICODONE) 5 MG immediate release tablet, Take 1-2 tablets by mouth every 4-6 hours as needed for post op pain, Disp: 40 tablet, Rfl: 0   Review of Systems:   ROS Negative unless otherwise specified per HPI.  Vitals:   Vitals:   06/24/22 1018  BP: 138/76  Pulse: 78  Temp: (!) 97.3 F (36.3 C)  TempSrc: Temporal  SpO2: 99%  Weight: 215 lb (97.5 kg)  Height: 5' 7"$  (1.702 m)     Body mass index is 33.67 kg/m.  Physical Exam:   Physical Exam Vitals and nursing note reviewed.  Constitutional:      General: He is not in acute distress.    Appearance: He is well-developed. He is not ill-appearing or toxic-appearing.  Cardiovascular:     Rate and Rhythm: Normal rate and regular rhythm.     Pulses: Normal pulses.     Heart sounds: Normal heart sounds, S1 normal and S2 normal.  Pulmonary:     Effort: Pulmonary effort is normal.     Breath sounds: Normal breath sounds.  Skin:    General: Skin is warm and dry.  Neurological:     Mental Status: He is alert.     GCS: GCS eye subscore is 4. GCS verbal subscore is 5. GCS motor subscore is 6.  Psychiatric:        Speech: Speech normal.        Behavior: Behavior normal. Behavior is cooperative.     Assessment and Plan:   Sepsis secondary to UTI [facility]) He is improving overall Discussed need for follow-up with urology if UTI sx return and also to follow-up on prior flomax rx and treatment  Pre-diabetes Most recent A1c well controlled It is reasonable to hold metformin for now Follow up in 3 months to recheck, sooner if concerns    [name], PA-C

## 2022-06-24 NOTE — Patient Instructions (Signed)
It was great to see you!  Stop metformin Follow-up in 3 months to recheck your A1c Update blood work today Please call [phone] to schedule with Dr. Theodis Shove    Take care,  [name] PA-C

## 2022-06-24 NOTE — Telephone Encounter (Signed)
Pt here today for hospital follow up

## 2022-06-26 ENCOUNTER — Telehealth: Payer: Self-pay | Admitting: Physician Assistant

## 2022-06-26 NOTE — Telephone Encounter (Signed)
Home Health Certification or Plan of Care Tracking  Is this a Certification or Plan of Care? Yes  [place] Agency: [place] Number:  [id number]  Has charge sheet been attached? Yes  Where has form been placed:  In provider's box  Faxed to:   [phone]

## 2022-06-27 NOTE — Telephone Encounter (Signed)
Received papers put in Samantha's folder to sign.

## 2022-07-01 NOTE — Telephone Encounter (Signed)
Samantha signed papers and I faxed them to [place].

## 2022-07-03 ENCOUNTER — Ambulatory Visit (INDEPENDENT_AMBULATORY_CARE_PROVIDER_SITE_OTHER): Payer: BC Managed Care – PPO | Admitting: Psychology

## 2022-07-03 DIAGNOSIS — F4321 Adjustment disorder with depressed mood: Secondary | ICD-10-CM

## 2022-07-03 NOTE — Progress Notes (Addendum)
[facility] Counselor Initial Adult Exam  Name: Troy Arellano Date: 07/03/2022 MRN: [id number] DOB: Nov 18, 1965 PCP: [name], PA  Time spent: 45 mins  Guardian/Payee:  Pt   Paperwork requested: No   Reason for Visit /Presenting Problem: Pt presents for session via telephone because he did not have WebEx loaded on his phone.  Pt granted consent for the session, stating he is in his home with no one else present.  I shared with pt that I am in my office with no one else present here.  Mental Status Exam: Appearance:   Casual     Behavior:  Appropriate  Motor:  Normal  Speech/Language:   Clear and Coherent  Affect:  Appropriate  Mood:  normal  Thought process:  normal  Thought content:    WNL  Sensory/Perceptual disturbances:    WNL  Orientation:  oriented to person, place, and time/date  Attention:  Good  Concentration:  Good  Memory:  WNL  Fund of knowledge:   Good  Insight:    Good  Judgment:   Good  Impulse Control:  Good   Reported Symptoms:  Pt shares he has been in the hospital twice so far this year (surgery in January on his left foot and an infection from the surgery in Feb).  Pt has been depressed by his health concerns.  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  He has been out of work Jul-Oct 2023 and Jan 2024-present.  He has an attorney representing him with this situation; Pt shares he has emotional times "when I break down and get depressed."  Pt's next follow up with his new surgeon is 07/22/22; he hopes to get his cast off then and hopes he gets into a boot at that point.    Risk Assessment: Danger to Self:  No Self-injurious Behavior: No Danger to Others: No Duty to Warn:no Physical Aggression / Violence:No  Access to Firearms a concern: No  Gang Involvement:No  Patient / guardian was educated about steps to take if suicide or homicide risk level increases between  visits: n/a While future psychiatric events cannot be accurately predicted, the patient does not currently require acute inpatient psychiatric care and does not currently meet [facility] involuntary commitment criteria.  Substance Abuse History: Current substance abuse: No ; pt does report that he has used alcohol to help him deal with his depression over his medical situation; he shares he has not used any alcohol since he came home from the hospital on 05/17/22.  He is taking only prescribed pain medication and is taking it as prescribed.  Pt does not smoke cigarettes and does not use marijuana.   Past Psychiatric History:   No previous psychological problems have been observed Outpatient Providers:One consultation when pt's father passed away several years ago History of Psych Hospitalization: No  Psychological Testing:  none    Abuse History:  Victim of: No.,  none    Report needed: No. Victim of Neglect:No. Perpetrator of  none   Witness / Exposure to Domestic Violence: No   Protective Services Involvement: No  Witness to [employer] Violence:  No   Family History:  Family History  Problem Relation Age of Onset   Hypertension Mother    Hypertension Father    Heart failure Father    Lymphoma Sister    Colon cancer Neg Hx    Esophageal cancer Neg Hx  Rectal cancer Neg Hx    Stomach cancer Neg Hx    Colon polyps Neg Hx     Living situation: the patient lives with their family  Sexual Orientation: Straight  Relationship Status: married 2 yrs; together for 7 yrs Name of spouse / other:Valeria If a parent, number of children / ages: son; 48 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)  Support Systems: spouse Son, mom  Museum/gallery curator Stress:  Yes   Income/Employment/Disability: Short-Term Disability;  [employer]--8 yrs; [employer]--1.5 yrs; long term disability  Military Service: No   Educational History: Education: high school diploma/GED  Religion/Sprituality/World View: Protestant  Any cultural  differences that may affect / interfere with treatment:  not applicable   Recreation/Hobbies: Cooking, watching cooking shows, travelling  Stressors: Museum/gallery curator difficulties   Health problems    Strengths: Supportive Relationships, Family, and Spirituality  Barriers:  financial barriers   Legal History: Pending legal issue / charges: The patient has no significant history of legal issues.  Pt does have an attorney looking into his medical situation. History of legal issue / charges:  none  Medical History/Surgical History: reviewed Past Medical History:  Diagnosis Date   Arthritis    Back   Benign localized prostatic hyperplasia with lower urinary tract symptoms (LUTS)    urologist--- dr Cain Sieve   Bilateral recurrent inguinal hernia    GERD (gastroesophageal reflux disease)    History of adenomatous polyp of colon    History of COVID-19 05/2020   per pt mild symptoms that resovled   Hyperlipidemia    Hypertension    followed by pcp   Type 2 diabetes mellitus ([place])    followed by pcp   (07-17-2021  per pt does not take check blood sugar )    Past Surgical History:  Procedure Laterality Date   COLONOSCOPY  12/28/2020   by dr h. danis   FOOT ARTHRODESIS Left 05/28/2022   Procedure: REVISION TRIPLE ARTHRODESIS;  Surgeon: Erle Crocker, MD;  Location: [facility];  Service: Orthopedics;  Laterality: Left;   HARDWARE REMOVAL Left 05/28/2022   Procedure: DEEP ORTHOPEDIC HARDWARE REMOVAL, LEFT FOOT POSTERIOR, MEDIAL AND LATERAL;  Surgeon: Erle Crocker, MD;  Location: [facility];  Service: Orthopedics;  Laterality: Left;  LENGTH OF SURGERY: 180 MINUTES   INGUINAL HERNIA REPAIR Bilateral    right 1987 and left 1988   INGUINAL HERNIA REPAIR Bilateral 07/20/2021   Procedure: LAPAROSCOPIC RECURRENT BILATERAL INGUINAL HERNIA REPAIR WITH MESH;  Surgeon: Stechschulte, Nickola Major, MD;  Location: [place];  Service: General;  Laterality: Bilateral;    Medications: Current  Outpatient Medications  Medication Sig Dispense Refill   acetaminophen (TYLENOL) 325 MG tablet Take 2 tablets (650 mg total) by mouth every 6 (six) hours as needed for fever or headache.     aspirin (BAYER ASPIRIN) 325 MG tablet Take 1 tablet by mouth daily for 30 DAYS for blood clot prevention 30 tablet 0   hydrochlorothiazide (HYDRODIURIL) 25 MG tablet TAKE 1 TABLET BY MOUTH EVERY DAY (Patient taking differently: Take 25 mg by mouth every morning.) 90 tablet 0   hydrOXYzine (VISTARIL) 25 MG capsule TAKE 1 CAPSULE (25 MG TOTAL) BY MOUTH AT BEDTIME AS NEEDED (SLEEP). 90 capsule 1   methocarbamol (ROBAXIN) 500 MG tablet Take 1 tablet (500 mg total) by mouth every 6 (six) hours as needed for muscle spasms. 30 tablet 0   oxyCODONE (ROXICODONE) 5 MG immediate release tablet Take 1-2 tablets by mouth every 4-6 hours as needed for post op pain 40  tablet 0   No current facility-administered medications for this visit.    No Known Allergies  Diagnoses:  Adjustment disorder with depressed mood  Plan of Care: Encouraged pt to download WebEx to his phone so we can use it at our follow up session next week.  Also encouraged pt to continue to engage in his self care activities and we will meet next week (07/09/22) for a follow up session.     Ivan Anchors, [facility]

## 2022-07-09 ENCOUNTER — Ambulatory Visit (INDEPENDENT_AMBULATORY_CARE_PROVIDER_SITE_OTHER): Payer: BC Managed Care – PPO | Admitting: Psychology

## 2022-07-09 DIAGNOSIS — F4321 Adjustment disorder with depressed mood: Secondary | ICD-10-CM

## 2022-07-09 NOTE — Progress Notes (Signed)
[facility] Counselor/Therapist Progress Note  Patient ID: Troy Arellano, MRN: [id number],    Date: 07/09/2022  Time Spent: 45 mins  Treatment Type: Individual Therapy  Reported Symptoms: Pt presents by phone for today's session as he does not have WebEx on his phone.  Pt grants consent for the session, stating he is in his home with no one else at home.  I shared with pt that I am in my office with no one else here either.  Mental Status Exam: Appearance:  Casual     Behavior: Appropriate  Motor: Normal  Speech/Language:  Clear and Coherent  Affect: Appropriate  Mood: normal  Thought process: normal  Thought content:   WNL  Sensory/Perceptual disturbances:   WNL  Orientation: oriented to person, place, and time/date  Attention: Good  Concentration: Good  Memory: WNL  Fund of knowledge:  Good  Insight:   Good  Judgment:  Good  Impulse Control: Good   Risk Assessment: Danger to Self:  No Self-injurious Behavior: No Danger to Others: No Duty to Warn:no Physical Aggression / Violence:No  Access to Firearms a concern: No  Gang Involvement:No   Subjective: Pt shares that he is still not sleeping well; "I just worry a lot about my physical condition; I am not sure how it is all going to work out.  My next appt with my surgeon in 3/18.  I just want to be sure I will be able to walk right again.  I never expected to be in this situation."  Pt shares that his wife is supportive of pt getting therapy because sometimes he gets snappy with those around him.  Pt shares that he enjoyed cooking in the past but it is hard for him to cook now because he can't stand up for a long period of time.  Pt shares he is just trying to take it day by day; he listens to ministers on the radio and on YouTube and prays a lot for God's healing in this situation.  Pt also shares that he enjoys watching movies from time to time; he also listens to music and enjoys reading the Bible and other  inspirational things as well.  Pt shares that he fell down recently and that was scary for pt.  He had to sit on the floor and cry for a while until he could get himself to a chair to pull up on to get out of the floor.  Pt shares that he is still not drinking at all.  Pt shares that his wife lost her job because she was taking care of pt.  He is waiting for his LTD to kick in from both jobs.  Encouraged pt to continue with his self care activities and we will meet next week for a follow up session.  Interventions: Cognitive Behavioral Therapy  Diagnosis:Adjustment disorder with depressed mood  Plan: Treatment Plan Strengths/Abilities:  Intelligent, Intuitive, Willing to participate in therapy Treatment Preferences:  Outpatient Individual Therapy Statement of Needs:  Patient is to use CBT, mindfulness and coping skills to help manage and/or decrease symptoms associated with their diagnosis. Symptoms:  Depressed/Irritable mood, worry, social withdrawal Problems Addressed:  Depressive thoughts, Sadness, Sleep issues, etc. Long Term Goals:  Pt to reduce overall level, frequency, and intensity of the feelings of depression as evidenced by decreased irritability, negative self talk, and helpless feelings from 6 to 7 days/week to 0 to 1 days/week, per client report, for at least 3 consecutive months.  Progress:  20% Short Term Goals:  Pt to verbally express understanding of the relationship between feelings of depression and their impact on thinking patterns and behaviors.  Pt to verbalize an understanding of the role that distorted thinking plays in creating fears, excessive worry, and ruminations.  Progress: 20% Target Date:  07/09/2023 Frequency:  Bi-weekly Modality:  Cognitive Behavioral Therapy Interventions by Therapist:  Therapist will use CBT, Mindfulness exercises, Coping skills and Referrals, as needed by client. Client has verbally approved this treatment plan.  Ivan Anchors, [facility]

## 2022-07-17 ENCOUNTER — Ambulatory Visit (INDEPENDENT_AMBULATORY_CARE_PROVIDER_SITE_OTHER): Payer: BC Managed Care – PPO | Admitting: Psychology

## 2022-07-17 DIAGNOSIS — F4321 Adjustment disorder with depressed mood: Secondary | ICD-10-CM

## 2022-07-17 NOTE — Progress Notes (Signed)
[facility] Counselor/Therapist Progress Note  Patient ID: Marinus Bauerlein, MRN: [id number],    Date: 07/17/2022  Time Spent: 30 mins  Treatment Type: Individual Therapy  Reported Symptoms: Pt presents by phone for today's session as he does not have WebEx on his phone.  Pt grants consent for the session, stating he is in his home with no one else at home.  I shared with pt that I am in my office with no one else here either.  Mental Status Exam: Appearance:  Casual     Behavior: Appropriate  Motor: Normal  Speech/Language:  Clear and Coherent  Affect: Appropriate  Mood: normal  Thought process: normal  Thought content:   WNL  Sensory/Perceptual disturbances:   WNL  Orientation: oriented to person, place, and time/date  Attention: Good  Concentration: Good  Memory: WNL  Fund of knowledge:  Good  Insight:   Good  Judgment:  Good  Impulse Control: Good   Risk Assessment: Danger to Self:  No Self-injurious Behavior: No Danger to Others: No Duty to Warn:no Physical Aggression / Violence:No  Access to Firearms a concern: No  Gang Involvement:No   Subjective: Pt shares that he went to the podiatrist this morning and they removed the cast.  They have him in a boot until 4/15 and will go back for a re-check then.  Pt shares that he is depressed and frustrated by his physical condition.  He shares that his STD ran out and he is waiting for his LTD to kick in.  He is having to find ways to pay his bills and keep his insurance active.  "I had a break down this morning, since it is my birthday and I finally got the cast off.  My wife was with me and that was good to have her with me."  Pt shares that he is taking his Gabapentin for pain.  Pt shares his son called him this morning to tell him Happy Birthday; he also posted some nice things on FB about pt being a good dad for him.  Encouraged pt think on things that he has to be thankful for in his life as a means of changing his  perspective from sad to more capable.  Also asked pt to go outside and sit in the sun for a few minutes this afternoon.  Pt shares that he still takes some medicine to help him fall asleep; having the cast made it hard for home to sleep.  He hopes he can take the boot off when he sleeps.  Pt shares that his son, his mom, and his aunt have all called him today to wish him Happy Birthday.  He has also gotten several texts from friends and family with similar wishes.  Pt shares that he listens to ministers on the radio and on YouTube and prays a lot for God's healing in this situation.  Pt also shares that he enjoys watching movies from time to time; he also listens to music and enjoys reading the Bible and other inspirational things as well.  Encouraged pt to continue with his self care activities and we will meet next week for a follow up session.  Interventions: Cognitive Behavioral Therapy  Diagnosis:Adjustment disorder with depressed mood  Plan: Treatment Plan Strengths/Abilities:  Intelligent, Intuitive, Willing to participate in therapy Treatment Preferences:  Outpatient Individual Therapy Statement of Needs:  Patient is to use CBT, mindfulness and coping skills to help manage and/or decrease symptoms associated with their diagnosis. Symptoms:  Depressed/Irritable mood, worry, social withdrawal Problems Addressed:  Depressive thoughts, Sadness, Sleep issues, etc. Long Term Goals:  Pt to reduce overall level, frequency, and intensity of the feelings of depression as evidenced by decreased irritability, negative self talk, and helpless feelings from 6 to 7 days/week to 0 to 1 days/week, per client report, for at least 3 consecutive months.  Progress: 20% Short Term Goals:  Pt to verbally express understanding of the relationship between feelings of depression and their impact on thinking patterns and behaviors.  Pt to verbalize an understanding of the role that distorted thinking plays in creating  fears, excessive worry, and ruminations.  Progress: 20% Target Date:  07/09/2023 Frequency:  Bi-weekly Modality:  Cognitive Behavioral Therapy Interventions by Therapist:  Therapist will use CBT, Mindfulness exercises, Coping skills and Referrals, as needed by client. Client has verbally approved this treatment plan.  Ivan Anchors, [facility]

## 2022-07-25 ENCOUNTER — Ambulatory Visit (INDEPENDENT_AMBULATORY_CARE_PROVIDER_SITE_OTHER): Payer: BC Managed Care – PPO | Admitting: Psychology

## 2022-07-25 DIAGNOSIS — F4321 Adjustment disorder with depressed mood: Secondary | ICD-10-CM

## 2022-07-25 NOTE — Progress Notes (Signed)
[place] Counselor/Therapist Progress Note  Patient ID: Policarpio Chand, MRN: [id number],    Date: 07/25/2022  Time Spent: 30 mins  Treatment Type: Individual Therapy  Reported Symptoms: Pt presents by phone for today's session as he does not have WebEx on his phone.  Pt grants consent for the session, stating he is in his home with no one else at home.  I shared with pt that I am in my office with no one else here either.  Mental Status Exam: Appearance:  Casual     Behavior: Appropriate  Motor: Normal  Speech/Language:  Clear and Coherent  Affect: Appropriate  Mood: normal  Thought process: normal  Thought content:   WNL  Sensory/Perceptual disturbances:   WNL  Orientation: oriented to person, place, and time/date  Attention: Good  Concentration: Good  Memory: WNL  Fund of knowledge:  Good  Insight:   Good  Judgment:  Good  Impulse Control: Good   Risk Assessment: Danger to Self:  No Self-injurious Behavior: No Danger to Others: No Duty to Warn:no Physical Aggression / Violence:No  Access to Firearms a concern: No  Gang Involvement:No   Subjective: Pt shares that he has been moving his ankle around but is limited with the movement.  He is thankful for not being in his cast anymore.  His wife is having some stomach pain and is waiting for her colonoscopy.  Pt is able to say that he is trying to be less depressed than he was last great.  Pt continues to read his Bible everyday and appreciates what that means to him.  Pt shares that his son had a rough week with his PTSD this week; he was in the [employer] for 8 yrs.  Pt is scheduled to start his PT after 4/15 when he sees his surgeon again.  Pt shares that he has been eating well since our last session and feels good about that.  Pt shares that he has ministers that come by his home to visit with him and pray with him.  Pt is planning to go outside later and sit on his patio in the sun and he is looking forward to that.   Pt shares that he is taking his Gabapentin for pain and it is working well for him.  Encouraged pt think on things that he has to be thankful for in his life as a means of changing his perspective from sad to more capable.  Pt shares that he listens to ministers on the radio and on YouTube and prays a lot for God's healing in this situation.  Pt also shares that he enjoys watching movies from time to time; he also listens to music and enjoys reading the Bible and other inspirational things as well.  Encouraged pt to continue with his self care activities and we will meet next week for a follow up session.  Interventions: Cognitive Behavioral Therapy  Diagnosis:Adjustment disorder with depressed mood  Plan: Treatment Plan Strengths/Abilities:  Intelligent, Intuitive, Willing to participate in therapy Treatment Preferences:  Outpatient Individual Therapy Statement of Needs:  Patient is to use CBT, mindfulness and coping skills to help manage and/or decrease symptoms associated with their diagnosis. Symptoms:  Depressed/Irritable mood, worry, social withdrawal Problems Addressed:  Depressive thoughts, Sadness, Sleep issues, etc. Long Term Goals:  Pt to reduce overall level, frequency, and intensity of the feelings of depression as evidenced by decreased irritability, negative self talk, and helpless feelings from 6 to 7 days/week to 0 to  1 days/week, per client report, for at least 3 consecutive months.  Progress: 20% Short Term Goals:  Pt to verbally express understanding of the relationship between feelings of depression and their impact on thinking patterns and behaviors.  Pt to verbalize an understanding of the role that distorted thinking plays in creating fears, excessive worry, and ruminations.  Progress: 20% Target Date:  07/09/2023 Frequency:  Weekly Modality:  Cognitive Behavioral Therapy Interventions by Therapist:  Therapist will use CBT, Mindfulness exercises, Coping skills and Referrals,  as needed by client. Client has verbally approved this treatment plan.  Ivan Anchors, [facility]

## 2022-08-01 ENCOUNTER — Ambulatory Visit (INDEPENDENT_AMBULATORY_CARE_PROVIDER_SITE_OTHER): Payer: BC Managed Care – PPO | Admitting: Psychology

## 2022-08-01 DIAGNOSIS — F4321 Adjustment disorder with depressed mood: Secondary | ICD-10-CM

## 2022-08-01 NOTE — Progress Notes (Signed)
[place] Counselor/Therapist Progress Note  Patient ID: Troy Arellano, MRN: [id number],    Date: 08/01/2022  Time Spent: 30 mins  Treatment Type: Individual Therapy  Reported Symptoms: Pt presents by phone for today's session as he does not have WebEx on his phone.  Pt grants consent for the session, stating he is in his home with no one else at home.  I shared with pt that I am in my office with no one else here either.  Mental Status Exam: Appearance:  Casual     Behavior: Appropriate  Motor: Normal  Speech/Language:  Clear and Coherent  Affect: Appropriate  Mood: normal  Thought process: normal  Thought content:   WNL  Sensory/Perceptual disturbances:   WNL  Orientation: oriented to person, place, and time/date  Attention: Good  Concentration: Good  Memory: WNL  Fund of knowledge:  Good  Insight:   Good  Judgment:  Good  Impulse Control: Good   Risk Assessment: Danger to Self:  No Self-injurious Behavior: No Danger to Others: No Duty to Warn:no Physical Aggression / Violence:No  Access to Firearms a concern: No  Gang Involvement:No   Subjective: Pt shares that he has been depressed this week; his son helped him by paying his insurance bill which was $925.00.  Pt is very thankful for him doing that but does not like being dependent on others for their help.  Pt is hoping to get back to work in May.  Pt is really stressed out by his financial pressures.  He denies any suicidal ideations, plan or intent.  His wife is still not feeling well and she is scheduled for her colonoscopy this coming Monday; he is hopeful that she can get to feeling better.  Pt shares that his ankle is giving him a bit of pain today; he believes that is due to the rainy weather.  He continues to practice moving his ankle to help him loosen the joint up.  Pt continues to read his Bible everyday and appreciates what that means to him.  Pt is scheduled to start his PT after 4/15 when he  sees his surgeon again.  He continues to follow the instructions from his surgeon very carefully.  Pt continues to eat well and is trying to maintain his strength.  Asked pt to reach out to his minister to ask him to come by for another visit to pray with him and lift his spirits; he said he would do this today.  Pt shares that he is taking his Gabapentin for pain and it is working well for him.  Encouraged pt think on things that he has to be thankful for in his life as a means of changing his perspective from sad to more capable.  Pt shares that he listens to ministers on the radio and on YouTube and prays a lot for God's healing in this situation.  Pt also shares that he enjoys watching movies from time to time; he also listens to music and enjoys reading the Bible and other inspirational things as well.  Encouraged pt to continue with his self care activities and we will meet next week for a follow up session.  Interventions: Cognitive Behavioral Therapy  Diagnosis:Adjustment disorder with depressed mood  Plan: Treatment Plan Strengths/Abilities:  Intelligent, Intuitive, Willing to participate in therapy Treatment Preferences:  Outpatient Individual Therapy Statement of Needs:  Patient is to use CBT, mindfulness and coping skills to help manage and/or decrease symptoms associated with their diagnosis. Symptoms:  Depressed/Irritable mood, worry, social withdrawal Problems Addressed:  Depressive thoughts, Sadness, Sleep issues, etc. Long Term Goals:  Pt to reduce overall level, frequency, and intensity of the feelings of depression as evidenced by decreased irritability, negative self talk, and helpless feelings from 6 to 7 days/week to 0 to 1 days/week, per client report, for at least 3 consecutive months.  Progress: 20% Short Term Goals:  Pt to verbally express understanding of the relationship between feelings of depression and their impact on thinking patterns and behaviors.  Pt to verbalize an  understanding of the role that distorted thinking plays in creating fears, excessive worry, and ruminations.  Progress: 20% Target Date:  07/09/2023 Frequency:  Weekly Modality:  Cognitive Behavioral Therapy Interventions by Therapist:  Therapist will use CBT, Mindfulness exercises, Coping skills and Referrals, as needed by client. Client has verbally approved this treatment plan.  Ivan Anchors, [facility]

## 2022-08-14 ENCOUNTER — Ambulatory Visit (INDEPENDENT_AMBULATORY_CARE_PROVIDER_SITE_OTHER): Payer: BC Managed Care – PPO | Admitting: Psychology

## 2022-08-14 DIAGNOSIS — F4321 Adjustment disorder with depressed mood: Secondary | ICD-10-CM | POA: Diagnosis not present

## 2022-08-14 NOTE — Progress Notes (Signed)
[facility] Counselor/Therapist Progress Note  Patient ID: Troy Arellano, MRN: [id number],    Date: 08/14/2022  Time Spent: 30 mins  Treatment Type: Individual Therapy  Reported Symptoms: Pt presents by phone for today's session as he does not have WebEx on his phone.  Pt grants consent for the session, stating he is in his home with no one else at home.  I shared with pt that I am in my office with no one else here either.  Mental Status Exam: Appearance:  Casual     Behavior: Appropriate  Motor: Normal  Speech/Language:  Clear and Coherent  Affect: Appropriate  Mood: normal  Thought process: normal  Thought content:   WNL  Sensory/Perceptual disturbances:   WNL  Orientation: oriented to person, place, and time/date  Attention: Good  Concentration: Good  Memory: WNL  Fund of knowledge:  Good  Insight:   Good  Judgment:  Good  Impulse Control: Good   Risk Assessment: Danger to Self:  No Self-injurious Behavior: No Danger to Others: No Duty to Warn:no Physical Aggression / Violence:No  Access to Firearms a concern: No  Gang Involvement:No   Subjective: Pt shares that he "has been doing a lot of exercising my foot a lot lately and I have been doing a lot of praying for me and my wife (stomach issues)."  Pt has an appt with his surgeon on Monday, 4/15, to get an update on how he is doing.  He believes he is healing well.  He is worried about his wife and hopes she will get better after her visit today.  Pt is hoping to start PT on his ankle after his appt on Monday.  Pt continues to read his Bible everyday and appreciates what that means to him.  He is continuing to look into what other jobs he might can do, if he can't return to his old jobs.  Pt continues to eat well and is trying to maintain his strength. Pt shares that he is taking his Gabapentin for pain and it is working well for him.  Encouraged pt think on things that he has to be thankful for in his life as a  means of changing his perspective from sad to more capable.  Pt shares that he listens to ministers on the radio and on YouTube and prays a lot for God's healing in this situation.  Pt also shares that he enjoys watching movies from time to time; he also listens to music and enjoys reading the Bible and other inspirational things as well.  Encouraged pt to continue with his self care activities and we will meet next week for a follow up session.  Interventions: Cognitive Behavioral Therapy  Diagnosis:Adjustment disorder with depressed mood  Plan: Treatment Plan Strengths/Abilities:  Intelligent, Intuitive, Willing to participate in therapy Treatment Preferences:  Outpatient Individual Therapy Statement of Needs:  Patient is to use CBT, mindfulness and coping skills to help manage and/or decrease symptoms associated with their diagnosis. Symptoms:  Depressed/Irritable mood, worry, social withdrawal Problems Addressed:  Depressive thoughts, Sadness, Sleep issues, etc. Long Term Goals:  Pt to reduce overall level, frequency, and intensity of the feelings of depression as evidenced by decreased irritability, negative self talk, and helpless feelings from 6 to 7 days/week to 0 to 1 days/week, per client report, for at least 3 consecutive months.  Progress: 20% Short Term Goals:  Pt to verbally express understanding of the relationship between feelings of depression and their impact on thinking  patterns and behaviors.  Pt to verbalize an understanding of the role that distorted thinking plays in creating fears, excessive worry, and ruminations.  Progress: 20% Target Date:  07/09/2023 Frequency:  Weekly Modality:  Cognitive Behavioral Therapy Interventions by Therapist:  Therapist will use CBT, Mindfulness exercises, Coping skills and Referrals, as needed by client. Client has verbally approved this treatment plan.  Karie Kirks, [facility]

## 2022-08-28 ENCOUNTER — Other Ambulatory Visit: Payer: Self-pay | Admitting: Podiatry

## 2022-08-29 ENCOUNTER — Encounter: Payer: BC Managed Care – PPO | Admitting: Psychology

## 2022-08-29 NOTE — Patient Instructions (Signed)
Erroneous encounter

## 2022-08-29 NOTE — Progress Notes (Signed)
[facility] Counselor/Therapist Progress Note  Patient ID: Troy Arellano, MRN: [id number],    Date: 08/29/2022  Time Spent: 0 mins  Treatment Type: Individual Therapy  Reported Symptoms:  Mental Status Exam: Appearance:  Casual     Behavior: Appropriate  Motor: Normal  Speech/Language:  Clear and Coherent  Affect: Appropriate  Mood: normal  Thought process: normal  Thought content:   WNL  Sensory/Perceptual disturbances:   WNL  Orientation: oriented to person, place, and time/date  Attention: Good  Concentration: Good  Memory: WNL  Fund of knowledge:  Good  Insight:   Good  Judgment:  Good  Impulse Control: Good   Risk Assessment: Danger to Self:  No Self-injurious Behavior: No Danger to Others: No Duty to Warn:no Physical Aggression / Violence:No  Access to Firearms a concern: No  Gang Involvement:No   Subjective: Erroneous note Interventions: Cognitive Behavioral Therapy  Diagnosis:No diagnosis found.  Plan: Treatment Plan Strengths/Abilities:  Intelligent, Intuitive, Willing to participate in therapy Treatment Preferences:  Outpatient Individual Therapy Statement of Needs:  Patient is to use CBT, mindfulness and coping skills to help manage and/or decrease symptoms associated with their diagnosis. Symptoms:  Depressed/Irritable mood, worry, social withdrawal Problems Addressed:  Depressive thoughts, Sadness, Sleep issues, etc. Long Term Goals:  Pt to reduce overall level, frequency, and intensity of the feelings of depression as evidenced by decreased irritability, negative self talk, and helpless feelings from 6 to 7 days/week to 0 to 1 days/week, per client report, for at least 3 consecutive months.  Progress: 20% Short Term Goals:  Pt to verbally express understanding of the relationship between feelings of depression and their impact on thinking patterns and behaviors.  Pt to verbalize an understanding of the role that distorted thinking plays in  creating fears, excessive worry, and ruminations.  Progress: 20% Target Date:  07/09/2023 Frequency:  Weekly Modality:  Cognitive Behavioral Therapy Interventions by Therapist:  Therapist will use CBT, Mindfulness exercises, Coping skills and Referrals, as needed by client. Client has verbally approved this treatment plan.  Karie Kirks, [facility]

## 2022-08-29 NOTE — Progress Notes (Signed)
This encounter was created in error - please disregard.

## 2022-11-28 ENCOUNTER — Other Ambulatory Visit: Payer: Self-pay | Admitting: Physician Assistant

## 2022-12-17 ENCOUNTER — Ambulatory Visit: Payer: BC Managed Care – PPO | Admitting: Physician Assistant

## 2022-12-17 ENCOUNTER — Encounter: Payer: Self-pay | Admitting: Physician Assistant

## 2022-12-17 VITALS — BP 130/80 | HR 56 | Temp 97.3°F | Ht 67.0 in | Wt 235.4 lb

## 2022-12-17 DIAGNOSIS — M79672 Pain in left foot: Secondary | ICD-10-CM | POA: Diagnosis not present

## 2022-12-17 DIAGNOSIS — J029 Acute pharyngitis, unspecified: Secondary | ICD-10-CM

## 2022-12-17 LAB — BASIC METABOLIC PANEL
BUN: 14 mg/dL (ref 6–23)
CO2: 24 mEq/L (ref 19–32)
Calcium: 9.2 mg/dL (ref 8.4–10.5)
Chloride: 103 mEq/L (ref 96–112)
Creatinine, Ser: 0.85 mg/dL (ref 0.40–1.50)
GFR: 97.2 mL/min (ref >60.00)
Glucose, Bld: 109 mg/dL — ABNORMAL HIGH (ref 70–99)
Potassium: 3.6 mEq/L (ref 3.5–5.1)
Sodium: 134 mEq/L — ABNORMAL LOW (ref 135–145)

## 2022-12-17 LAB — POCT RAPID STREP A (OFFICE): Rapid Strep A Screen: NEGATIVE

## 2022-12-17 MED ORDER — MELOXICAM 15 MG PO TABS: 15.0000 mg | ORAL_TABLET | Freq: Every day | ORAL | 1 refills | Status: DC

## 2022-12-17 MED ORDER — GABAPENTIN 300 MG PO CAPS: 300.0000 mg | ORAL_CAPSULE | Freq: Every day | ORAL | 1 refills | Status: DC

## 2022-12-17 NOTE — Patient Instructions (Signed)
It was great to see you!  Strep test is negative -- you likely have a virus. May take tylenol or the meloxicam that I have sent in. If you take meloxicam, do not take additional ibuprofen/motrin/aleve  Restart gabapentin 300 mg at night  I have placed referral for your pain  Let's follow-up in 6 months, sooner if you have concerns.  Take care,  Jarold Motto PA-C

## 2022-12-17 NOTE — Progress Notes (Signed)
Troy Arellano is a 57 y.o. male here for a follow up of a pre-existing problem.  History of Present Illness:   Chief Complaint  Patient presents with   Sore Throat    Pt c/o sore throat, started yesterday. Denies fever or chills.   Foot Pain    Pt c/o left foot pain, numbness and tingling top of foot and great toe.   Sore Throat: Complains of sore, itchy throat. Reports that this morning soreness was more like a burning sensation.  Endorses positive sick contact. Strep test conducted during visit resulted negative.  Denies rhinorrhea or ear pain, fevers or chills.  Foot Pain (Left): Complains of left foot pain, along with numbness and tingling dorsally diffuse to great toe.  He has seen a few foot specialists, most recently Dr Susa Simmonds which he has been cleared from. Requested referral for pain specialist.  States he has been using Advil and 300 mg Gabapentin with improvements to symptoms. He has run out of his Gabapentin recently.     Past Medical History:  Diagnosis Date   Arthritis    Back   Benign localized prostatic hyperplasia with lower urinary tract symptoms (LUTS)    urologist--- dr Lafonda Mosses   Bilateral recurrent inguinal hernia    GERD (gastroesophageal reflux disease)    History of adenomatous polyp of colon    History of COVID-19 05/2020   per pt mild symptoms that resovled   Hyperlipidemia    Hypertension    followed by pcp   Type 2 diabetes mellitus (HCC)    followed by pcp   (07-17-2021  per pt does not take check blood sugar )     Social History   Tobacco Use   Smoking status: Never   Smokeless tobacco: Never  Vaping Use   Vaping status: Never Used  Substance Use Topics   Alcohol use: Yes    Alcohol/week: 2.0 - 3.0 standard drinks of alcohol    Types: 2 - 3 Standard drinks or equivalent per week    Comment: occasionally   Drug use: No    Past Surgical History:  Procedure Laterality Date   COLONOSCOPY  12/28/2020   by dr h. danis   FOOT ARTHRODESIS  Left 05/28/2022   Procedure: REVISION TRIPLE ARTHRODESIS;  Surgeon: Terance Hart, MD;  Location: [facility] OR;  Service: Orthopedics;  Laterality: Left;   HARDWARE REMOVAL Left 05/28/2022   Procedure: DEEP ORTHOPEDIC HARDWARE REMOVAL, LEFT FOOT POSTERIOR, MEDIAL AND LATERAL;  Surgeon: Terance Hart, MD;  Location: [facility] OR;  Service: Orthopedics;  Laterality: Left;  LENGTH OF SURGERY: 180 MINUTES   INGUINAL HERNIA REPAIR Bilateral    right 1987 and left 1988   INGUINAL HERNIA REPAIR Bilateral 07/20/2021   Procedure: LAPAROSCOPIC RECURRENT BILATERAL INGUINAL HERNIA REPAIR WITH MESH;  Surgeon: Stechschulte, Hyman Hopes, MD;  Location: [facility];  Service: General;  Laterality: Bilateral;    Family History  Problem Relation Age of Onset   Hypertension Mother    Hypertension Father    Heart failure Father    Lymphoma Sister    Colon cancer Neg Hx    Esophageal cancer Neg Hx    Rectal cancer Neg Hx    Stomach cancer Neg Hx    Colon polyps Neg Hx     No Known Allergies  Current Medications:   Current Outpatient Medications:    acetaminophen (TYLENOL) 325 MG tablet, Take 2 tablets (650 mg total) by mouth every 6 (six) hours as needed  for fever or headache., Disp: , Rfl:    aspirin (BAYER ASPIRIN) 325 MG tablet, Take 1 tablet by mouth daily for 30 DAYS for blood clot prevention, Disp: 30 tablet, Rfl: 0   hydrochlorothiazide (HYDRODIURIL) 25 MG tablet, TAKE 1 TABLET BY MOUTH EVERY DAY (Patient taking differently: Take 25 mg by mouth every morning.), Disp: 90 tablet, Rfl: 0   hydrOXYzine (VISTARIL) 25 MG capsule, TAKE 1 CAPSULE (25 MG TOTAL) BY MOUTH AT BEDTIME AS NEEDED (SLEEP)., Disp: 90 capsule, Rfl: 0   ibuprofen (ADVIL) 200 MG tablet, Take 200-400 mg by mouth every 6 (six) hours as needed., Disp: , Rfl:    Review of Systems:   Review of Systems  HENT:  Positive for sore throat (itchy, (this morning) described as burning).   Musculoskeletal:        (+) Foot Pain (Left,  associated dorsal numbness and tingling diffuse to great toe) (+) Numbness (Left foot dorsally diffuse to great toe)  Neurological:  Positive for tingling (Left Foot dorsally diffuse to great toe).    Vitals:   Vitals:   12/17/22 0803  BP: 130/80  Pulse: (!) 56  Temp: (!) 97.3 F (36.3 C)  TempSrc: Temporal  SpO2: 97%  Weight: 235 lb 6.1 oz (106.8 kg)  Height: 5\' 7"  (1.702 m)     Body mass index is 36.87 kg/m.  Physical Exam:   Physical Exam Vitals and nursing note reviewed.  Constitutional:      General: He is not in acute distress.    Appearance: He is well-developed. He is not ill-appearing or toxic-appearing.  HENT:     Head: Normocephalic and atraumatic.     Right Ear: Tympanic membrane, ear canal and external ear normal. Tympanic membrane is not erythematous, retracted or bulging.     Left Ear: Tympanic membrane, ear canal and external ear normal. Tympanic membrane is not erythematous, retracted or bulging.     Nose: Nose normal.     Right Sinus: No maxillary sinus tenderness or frontal sinus tenderness.     Left Sinus: No maxillary sinus tenderness or frontal sinus tenderness.     Mouth/Throat:     Pharynx: Uvula midline. No posterior oropharyngeal erythema.  Eyes:     General: Lids are normal.     Conjunctiva/sclera: Conjunctivae normal.  Neck:     Trachea: Trachea normal.  Cardiovascular:     Rate and Rhythm: Normal rate and regular rhythm.     Pulses: Normal pulses.     Heart sounds: Normal heart sounds, S1 normal and S2 normal.  Pulmonary:     Effort: Pulmonary effort is normal.     Breath sounds: Normal breath sounds. No decreased breath sounds, wheezing, rhonchi or rales.  Lymphadenopathy:     Cervical: No cervical adenopathy.  Skin:    General: Skin is warm and dry.  Neurological:     Mental Status: He is alert.     GCS: GCS eye subscore is 4. GCS verbal subscore is 5. GCS motor subscore is 6.  Psychiatric:        Speech: Speech normal.         Behavior: Behavior normal. Behavior is cooperative.    Results for orders placed or performed in visit on 12/17/22  POCT rapid strep A  Result Value Ref Range   Rapid Strep A Screen Negative Negative    Assessment and Plan:   Left foot pain Ongoing Does not want any opioids/narcotics Resume gabapentin 300 mg nightly  Start  meloxicam 15 mg in AM on days working -- avoid additional NSAIDs while on this Referral to physical medicine and rehab  Sore throat No red flags on exam.  Will initiate supportive care include NSAIDs and fluids. Strep test negative, suspect viral upper respiratory infection (URI). Discussed taking medications as prescribed. Reviewed return precautions including worsening fever, SOB, worsening cough or other concerns. Push fluids and rest. I recommend that patient follow-up if symptoms worsen or persist despite treatment x 7-10 days, sooner if needed.   I,Emily Lagle,acting as a Neurosurgeon for [employer], PA.,have documented all relevant documentation on the behalf of Jarold Motto, PA,as directed by  Jarold Motto, PA while in the presence of Jarold Motto, [place].  I, Jarold Motto, [place], have reviewed all documentation for this visit. The documentation on 12/17/22 for the exam, diagnosis, procedures, and orders are all accurate and complete.  Jarold Motto, PA-C

## 2023-01-23 IMAGING — DX DG CHEST 2V
2 series · 2 of 2 positions shown · non-contrast
Comparison: 08/13/2019

CLINICAL DATA: Chest pain since 4 p.m., midsternal pain

EXAM:
CHEST - 2 VIEW

[chest pa]
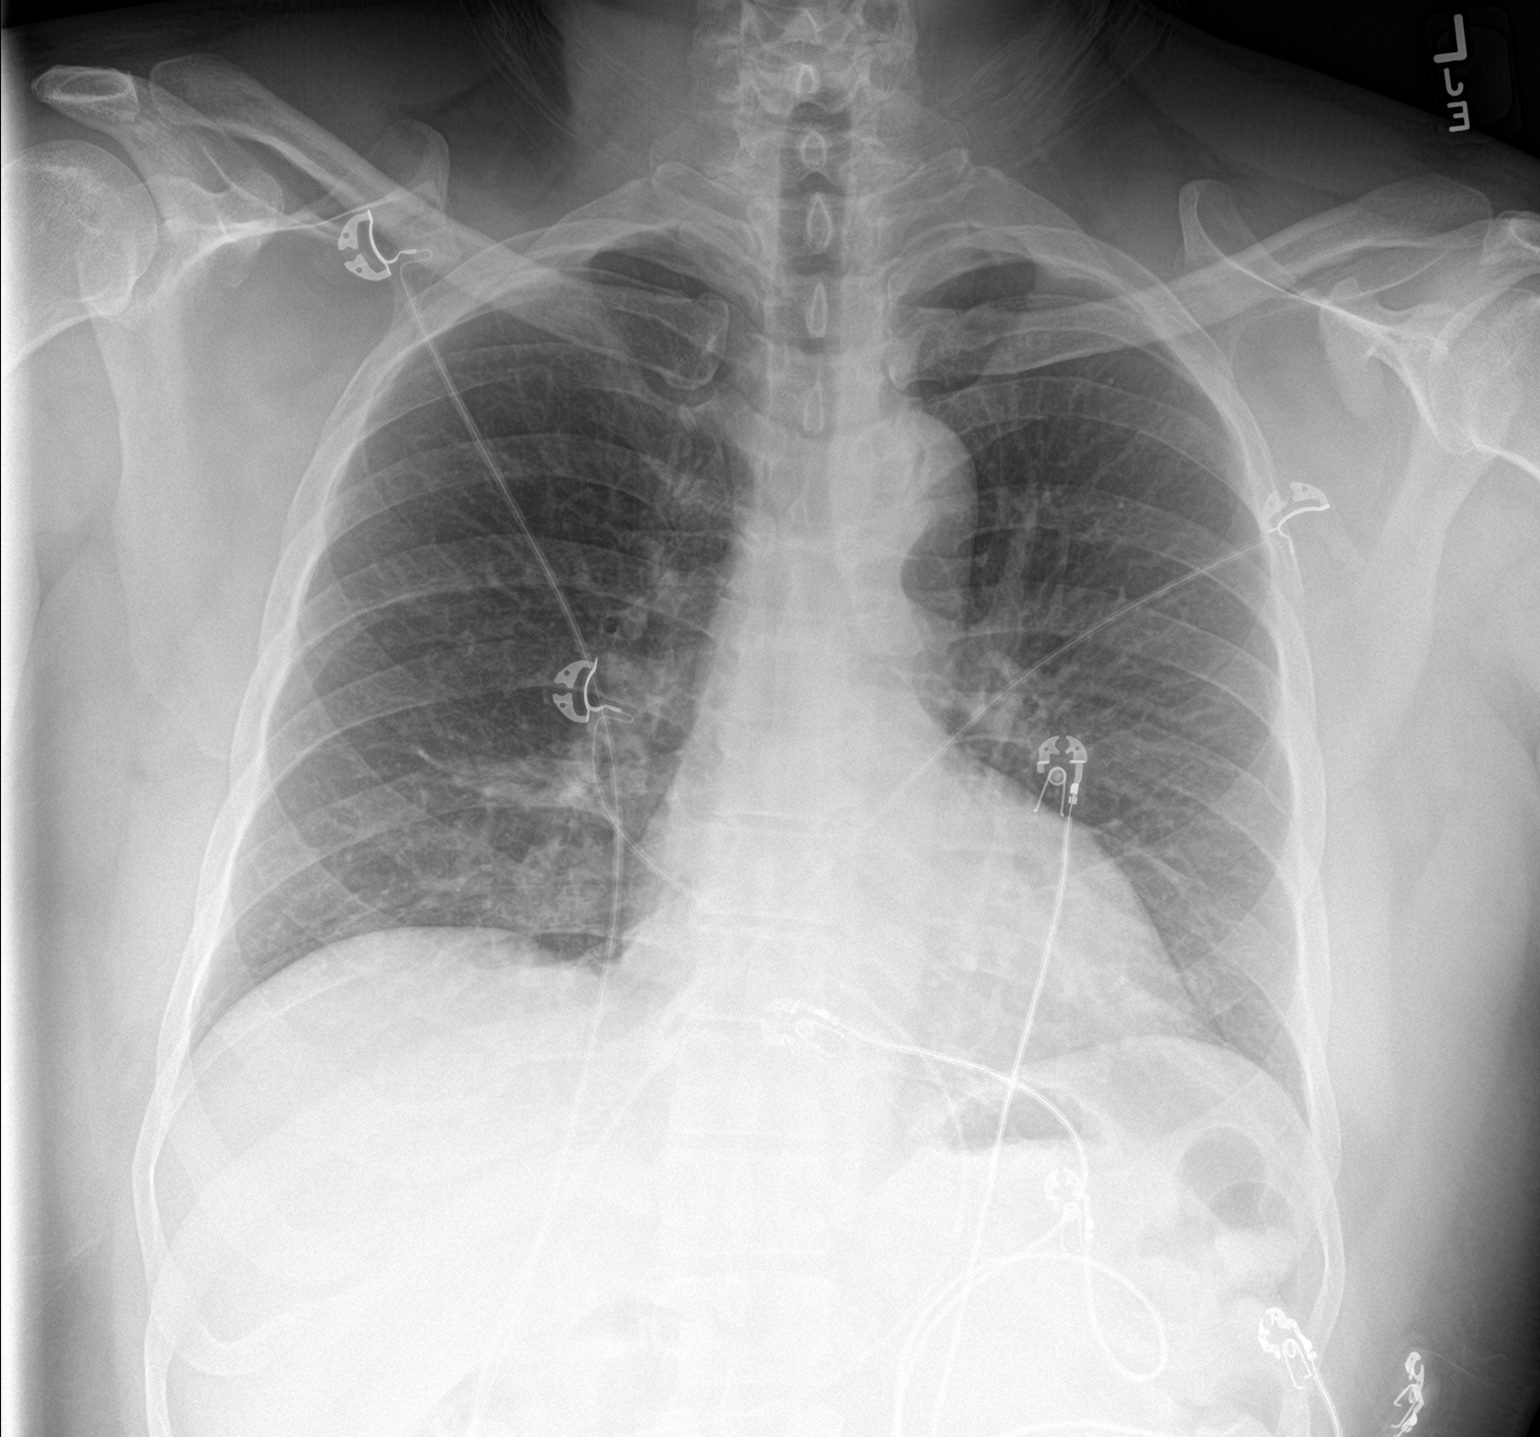

[chest lat]
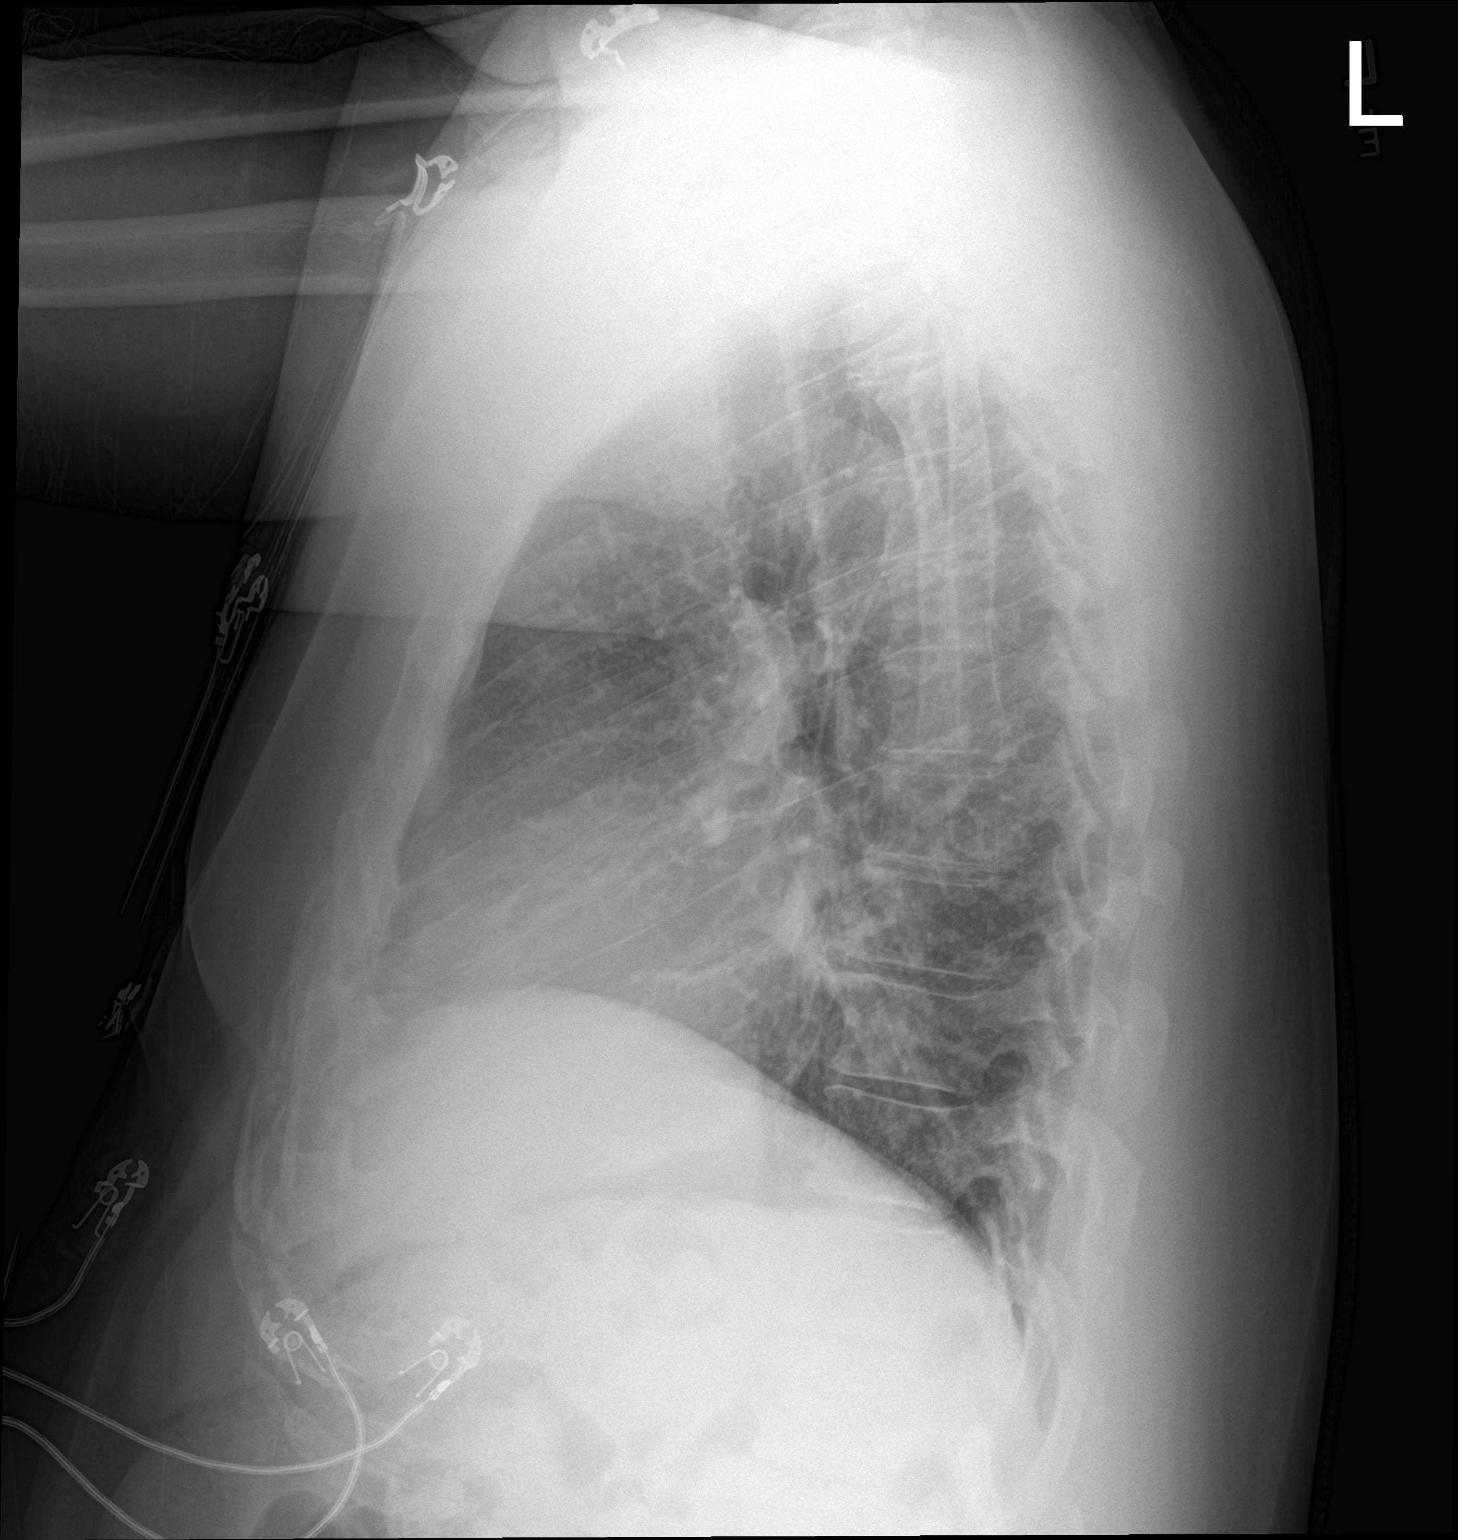

[2 of 2 positions shown; findings below may reference images not displayed]

FINDINGS: Frontal and lateral views of the chest demonstrate an unremarkable
cardiac silhouette. No airspace disease, effusion, or pneumothorax.
No acute bony abnormality.
IMPRESSION: 1. No acute intrathoracic process.

## 2023-01-24 IMAGING — CT CT ANGIO CHEST-ABD-PELV FOR DISSECTION W/ AND WO/W CM
2 of 9 series · 12 of 46 positions shown, 14 images · IV contrast (Omnipaque)
Comparison: None.

CLINICAL DATA: Chest pain or back pain, aortic dissection
suspected. Worsening chest pain. Hx diabetes and HTN.

EXAM:
CT ANGIOGRAPHY CHEST, ABDOMEN AND PELVIS
TECHNIQUE: Non-contrast CT of the chest was initially obtained.

[Series 5: axial arterial · axial · arterial · 0.98mm/px · z∈[+754,+1264]mm · 9 of 214 slices shown, 11 images]
[im 22/214  soft-tissue]
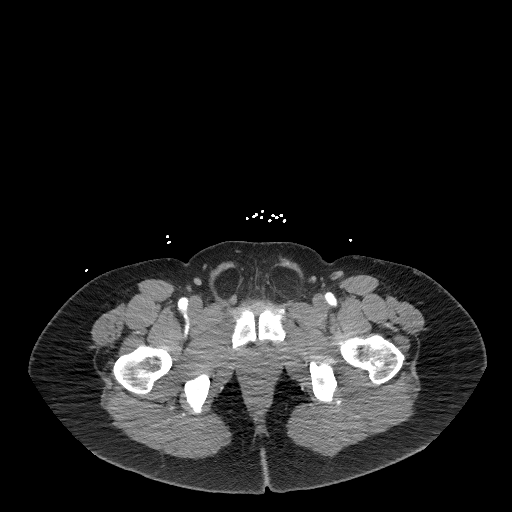
[im 22/214  bone]
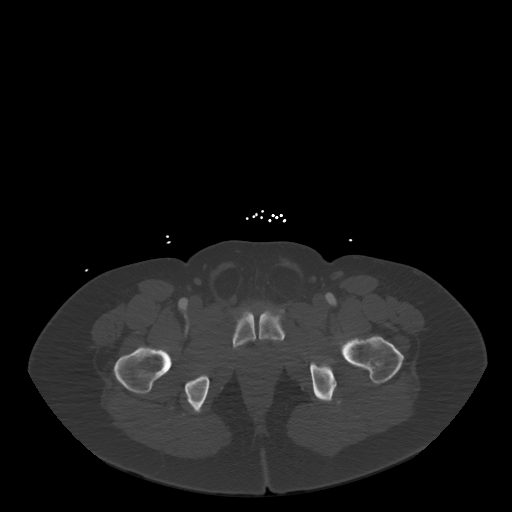
[im 43/214  soft-tissue]
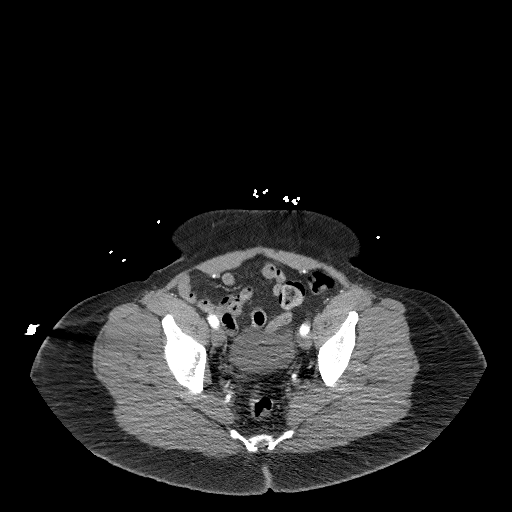
[im 64/214  soft-tissue]
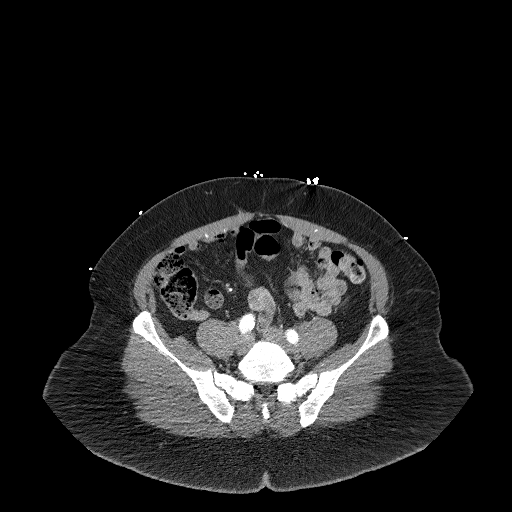
[im 86/214  soft-tissue]
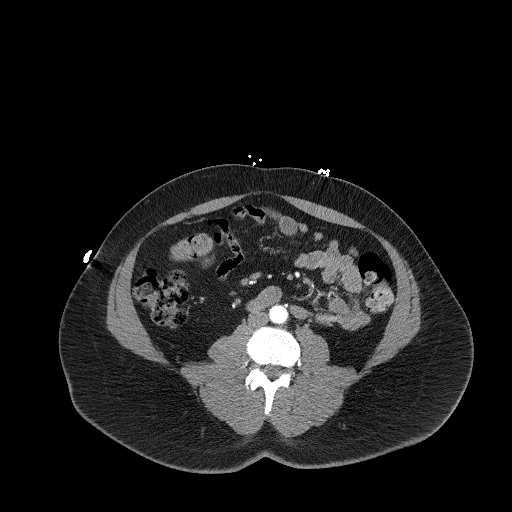
[im 107/214  soft-tissue]
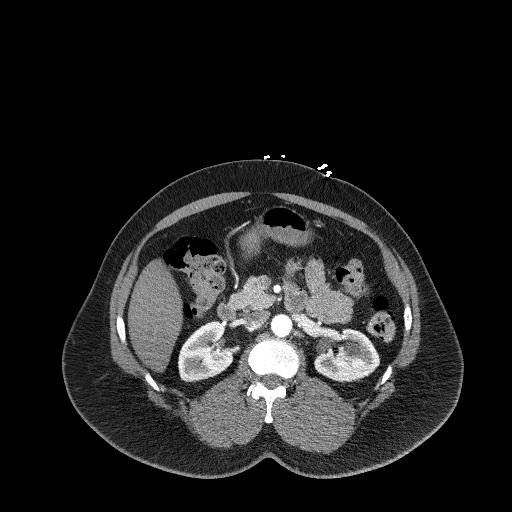
[im 128/214  soft-tissue]
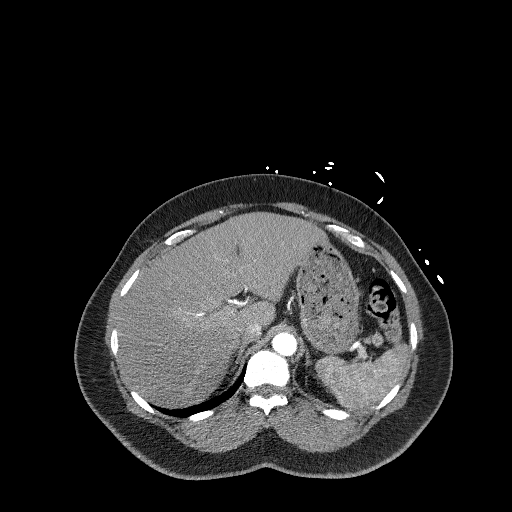
[im 150/214  soft-tissue]
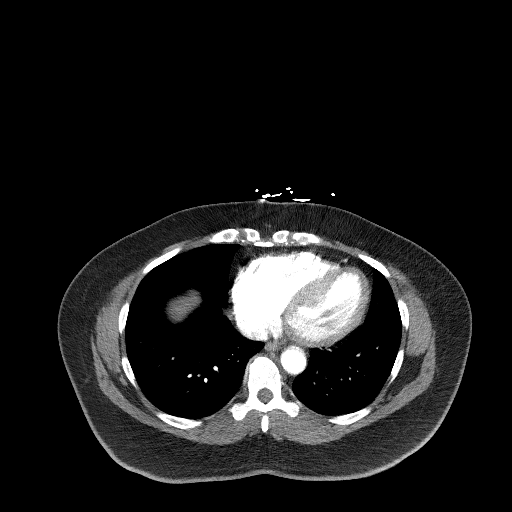
[im 171/214  soft-tissue]
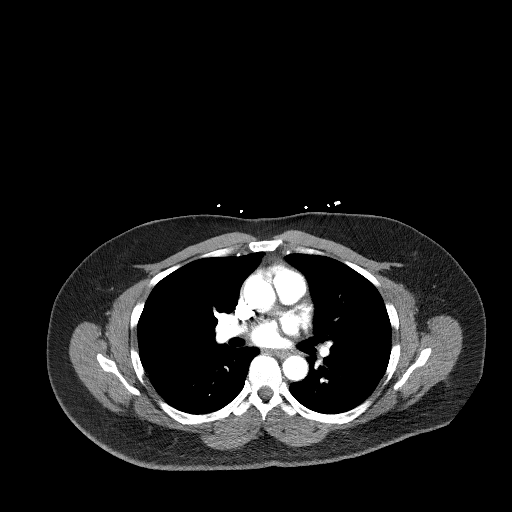
[im 192/214  soft-tissue]
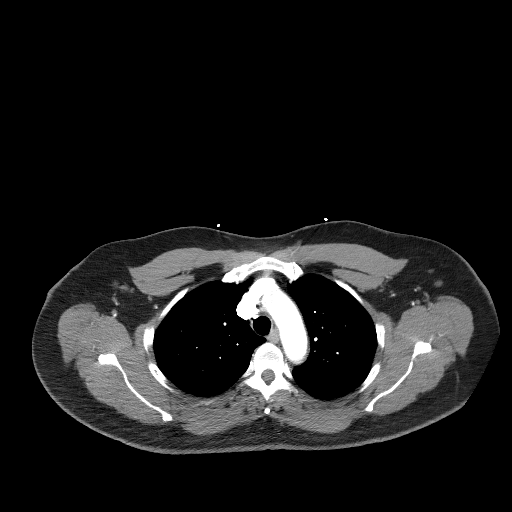
[im 192/214  bone]
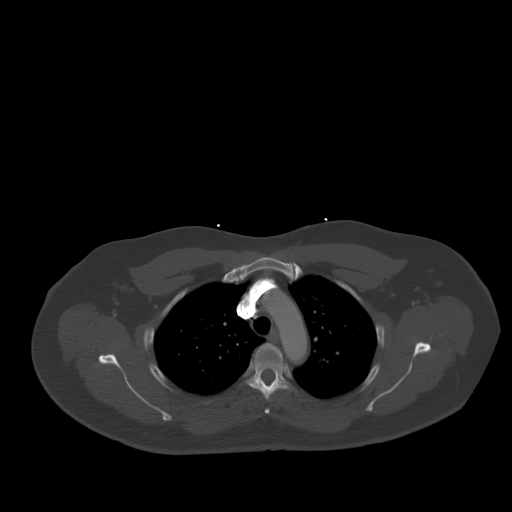

[Series 8: coronals · coronal · 0.77mm/px · 3 of 151 slices shown]
[im 38/151  soft-tissue]
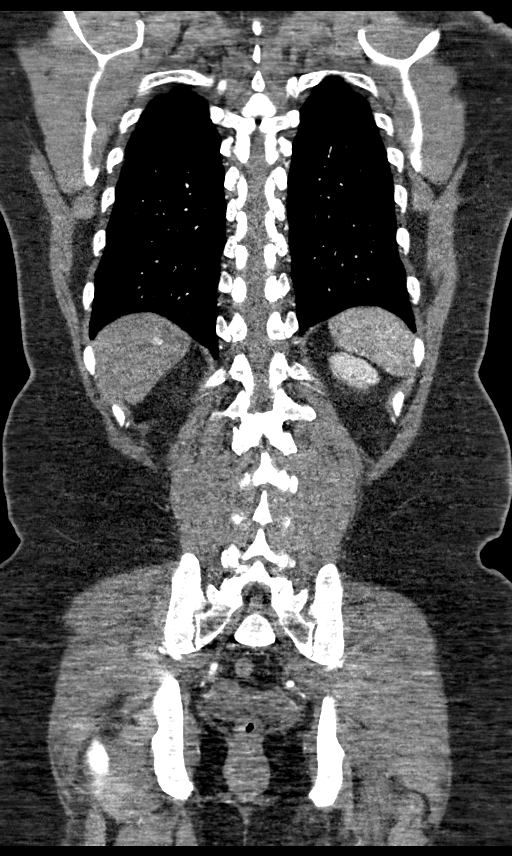
[im 76/151  soft-tissue]
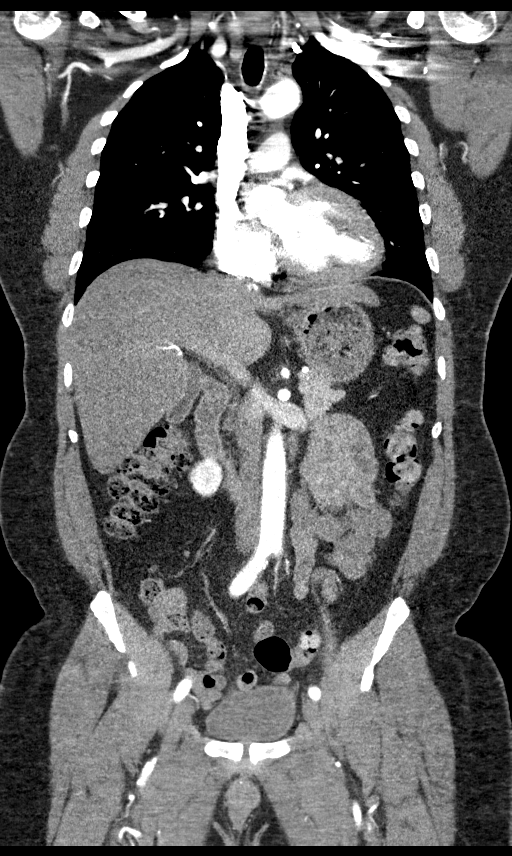
[im 113/151  soft-tissue]
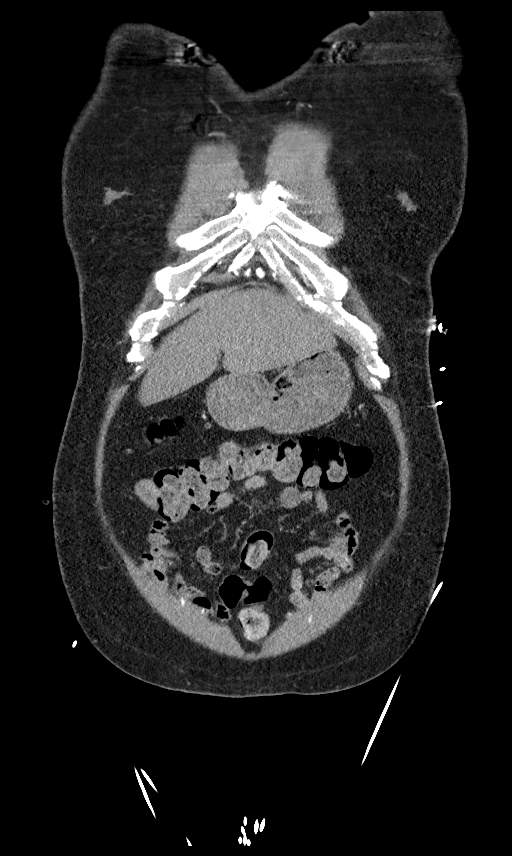

[12 of 46 positions shown; findings below may reference images not displayed]

Multidetector CT imaging through the chest, abdomen and pelvis was
performed using the standard protocol during bolus administration of
intravenous contrast. Multiplanar reconstructed images and MIPs were
obtained and reviewed to evaluate the vascular anatomy.

RADIATION DOSE REDUCTION: This exam was performed according to the
departmental dose-optimization program which includes automated
exposure control, adjustment of the mA and/or kV according to
patient size and/or use of iterative reconstruction technique.

CONTRAST:  100mL OMNIPAQUE IOHEXOL 350 MG/ML SOLN
FINDINGS: CTA CHEST FINDINGS

Cardiovascular: Preferential opacification of the thoracic aorta. No
evidence of thoracic aortic aneurysm or dissection. No
atherosclerotic plaque of the thoracic aorta.Normal heart size. No
significant pericardial effusion. No coronary artery calcifications.
Artifact within the main pulmonary artery with no definite central
pulmonary embolus.

Mediastinum/Nodes: No enlarged mediastinal, hilar, or axillary lymph
nodes. Thyroid gland, trachea, and esophagus demonstrate no
significant findings.

Lungs/Pleura: No focal consolidation. No pulmonary nodule. No
pulmonary mass. No pleural effusion. No pneumothorax.

Musculoskeletal:

No chest wall abnormality.

No suspicious lytic or blastic osseous lesions. No acute displaced
fracture.

Review of the MIP images confirms the above findings.

CTA ABDOMEN AND PELVIS FINDINGS

VASCULAR

Aorta: Mild atherosclerotic plaque. Normal caliber aorta without
aneurysm, dissection, vasculitis or significant stenosis.

Celiac: Patent without evidence of aneurysm, dissection, vasculitis
or significant stenosis.

SMA: Patent without evidence of aneurysm, dissection, vasculitis or
significant stenosis.

Renals: Both renal arteries are patent without evidence of aneurysm,
dissection, vasculitis, fibromuscular dysplasia or significant
stenosis.

IMA: Patent without evidence of aneurysm, dissection, vasculitis or
significant stenosis.

Inflow: Mild atherosclerotic plaque. Patent without evidence of
aneurysm, dissection, vasculitis or significant stenosis.

Veins: No obvious venous abnormality within the limitations of this
arterial phase study.

Review of the MIP images confirms the above findings.

NON-VASCULAR

Hepatobiliary: There is a hyperdense 8 mm right posterior hepatic
lobe lesion likely representing a flash filling hemangioma ([DATE]).
Contracted gallbladder. No gallstones, gallbladder wall thickening,
or pericholecystic fluid. No biliary dilatation.

Pancreas: No focal lesion. Normal pancreatic contour. No surrounding
inflammatory changes. No main pancreatic ductal dilatation.

Spleen: Normal in size without focal abnormality.

Adrenals/Urinary Tract:

Poorly visualized right adrenal gland. No definite nodularity of
bilateral adrenal glands.

Bilateral kidneys enhance symmetrically. There is a 0.9 cm fluid
density lesion within left kidney likely represents a simple renal
cyst. No hydronephrosis. No hydroureter.

Query irregular hypodense urinary bladder wall thickening along the
left anterior region ([DATE]).

Stomach/Bowel: Stomach is within normal limits. No evidence of bowel
wall thickening or dilatation. Appendix appears normal.

Lymphatic: No lymphadenopathy.

Reproductive: The prostate is enlarged measuring up to 5.2 cm.

Other: No intraperitoneal free fluid. No intraperitoneal free gas.
No organized fluid collection.

Musculoskeletal:

Bilateral small volume inguinal hernia on the left containing fat
not on the right containing fat as well as a short loop of small
bowel. Right abdominal defect of 2 cm. Left abdominal defect of
cm. Diastasis rectus.

No suspicious lytic or blastic osseous lesions. No acute displaced
fracture.

Review of the MIP images confirms the above findings.
IMPRESSION: 1. No acute vascular abnormality.
2. No acute intrathoracic abnormality.
3. Query irregular hypodense urinary bladder wall thickening.
Finding of unclear etiology. Recommend correlation with urinalysis
and possible direct visualization. Query inflammatory pseudotumor or
cystitis.
4. Prostatomegaly.
5. Bilateral small volume inguinal hernia on the left containing fat
not on the right containing fat as well as a short loop of small
bowel. No associated findings to suggest ischemia. No associated
bowel obstruction.

## 2023-03-03 ENCOUNTER — Other Ambulatory Visit: Payer: Self-pay | Admitting: Physician Assistant

## 2023-03-03 MED ORDER — HYDROCHLOROTHIAZIDE 25 MG PO TABS: 25.0000 mg | ORAL_TABLET | Freq: Every day | ORAL | 0 refills | Status: DC

## 2023-03-03 MED ORDER — GABAPENTIN 300 MG PO CAPS: 300.0000 mg | ORAL_CAPSULE | Freq: Every day | ORAL | 1 refills | Status: AC

## 2023-03-03 NOTE — Telephone Encounter (Signed)
Pt needs refill for Gabapentin 300 mg. Last OV 12/17/2022.

## 2023-03-03 NOTE — Telephone Encounter (Signed)
Spoke to pt told him I do not have you on Diclofenac. Pt said he wants some thing to release the water. Told him I will refill your hydrochlorothiazide which is the water pill. Pt verbalized understanding and said he needs refill on Gabapentin too. Told him okay will send Rx's to the pharmacy. Pt verbalized understanding.

## 2023-03-03 NOTE — Telephone Encounter (Signed)
Prescription Request  03/03/2023  LOV: 12/17/2022  What is the name of the medication or equipment? Diclofenac 75 mg  and HCTZ 25 MG  Have you contacted your pharmacy to request a refill? Yes   Which pharmacy would you like this sent to?  [id number] [place], [place] - [address] [place] [address] Phone: [phone] Fax: [phone]    Patient notified that their request is being sent to the clinical staff for review and that they should receive a response within 2 business days.   Please advise at Mobile [phone] (mobile)

## 2023-03-04 ENCOUNTER — Other Ambulatory Visit: Payer: Self-pay | Admitting: Podiatry

## 2023-05-28 ENCOUNTER — Other Ambulatory Visit: Payer: Self-pay | Admitting: Physician Assistant

## 2023-06-11 ENCOUNTER — Ambulatory Visit: Payer: Self-pay | Admitting: Physician Assistant

## 2023-06-11 ENCOUNTER — Encounter: Payer: Self-pay | Admitting: Urgent Care

## 2023-06-11 ENCOUNTER — Ambulatory Visit: Payer: BC Managed Care – PPO | Admitting: Urgent Care

## 2023-06-11 VITALS — BP 154/94 | HR 57 | Temp 97.7°F | Wt 232.0 lb

## 2023-06-11 DIAGNOSIS — R0981 Nasal congestion: Secondary | ICD-10-CM | POA: Diagnosis not present

## 2023-06-11 DIAGNOSIS — U071 COVID-19: Secondary | ICD-10-CM | POA: Diagnosis not present

## 2023-06-11 DIAGNOSIS — R519 Headache, unspecified: Secondary | ICD-10-CM

## 2023-06-11 LAB — POC COVID19 BINAXNOW: SARS Coronavirus 2 Ag: POSITIVE — AB

## 2023-06-11 MED ORDER — NIRMATRELVIR/RITONAVIR (PAXLOVID)TABLET: 3.0000 | ORAL_TABLET | Freq: Two times a day (BID) | ORAL | 0 refills | Status: AC

## 2023-06-11 MED ORDER — KETOROLAC TROMETHAMINE 30 MG/ML IJ SOLN
60.0000 mg | Freq: Once | INTRAMUSCULAR | Status: AC
  Administered 2023-06-11: 60 mg via INTRAMUSCULAR

## 2023-06-11 NOTE — Patient Instructions (Addendum)
 Your test is positive for covid. This is likely the cause of your nasal congestion and headache.  I have prescribed you paxlovid , which is an antiviral which can help prevent worsening covid symptoms. Please read the attached handout regarding this medication.  Paxlovid  is not a cure, but rather a prevention for worsening symptoms. Common side effects include headache and nausea/vomiting/diarrhea. If these occur or your symptoms worsen, you can stop the medication.  Please REST. Stay out of work until your symptoms improve. Covid is passed by droplets - this is when you cough or sneeze. Wash hands frequently.

## 2023-06-11 NOTE — Telephone Encounter (Signed)
 No further action needed.

## 2023-06-11 NOTE — Progress Notes (Signed)
 Established Patient Office Visit  Subjective:  Patient ID: Troy Arellano, male    DOB: Nov 09, 1965  Age: 58 y.o. MRN: [id number]  Chief Complaint  Patient presents with   Headache    Headache and ringing in left ear that started yesterday. He also has some post nasal drainage.    HPI  Discussed the use of AI scribe software for clinical note transcription with the patient, who gave verbal consent to proceed.  History of Present Illness   Troy Arellano is a 58 year old male who presents with a new onset headache and ear tingling.  He has a new onset headache that began yesterday while at work. Initially diffuse, the headache persisted despite taking Advil  and drinking water. It is now primarily on the left side of his head, with associated tingling in his ears. The headache worsens when he bends over. He has no history of similar headaches in the past. He has been taking Advil , with the last dose being two tablets at approximately 2 AM today, but it has not provided significant relief. He occasionally takes meloxicam  as needed but is not on it daily. No associated symptoms such as rash, lumps, bumps, neck stiffness, fever, chills, changes in vision, weakness, slurred speech, or cough.  He mentions a history of sinus issues and congestion, but currently, his primary concern is the headache and ear tingling. He recalls losing his voice on Monday, which he associates with previous COVID-19 symptoms experienced when the pandemic began. He has been exposed to colleagues who recently had flu and pneumonia, but denies any known COVID-19 exposure.  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       Patient Active Problem List   Diagnosis Date Noted   Severe sepsis (HCC) 06/16/2022   E coli bacteremia 06/16/2022   Sepsis secondary to UTI (HCC) 06/15/2022   Pre-diabetes 06/15/2022   History of foot surgery 06/15/2022   Left foot pain 12/25/2021   Intractable pain 12/25/2021   Post-operative  pain 12/25/2021   Nonintractable headache 10/18/2020   Hyperlipidemia 10/11/2019   Insulin  resistance 10/11/2019   Chronic right shoulder pain 08/30/2019   Obesity 08/30/2019   Essential hypertension 08/30/2019   Past Medical History:  Diagnosis Date   Arthritis    Back   Benign localized prostatic hyperplasia with lower urinary tract symptoms (LUTS)    urologist--- dr lovie   Bilateral recurrent inguinal hernia    GERD (gastroesophageal reflux disease)    History of adenomatous polyp of colon    History of COVID-19 05/2020   per pt mild symptoms that resovled   Hyperlipidemia    Hypertension    followed by pcp   Type 2 diabetes mellitus (HCC)    followed by pcp   (07-17-2021  per pt does not take check blood sugar )   Past Surgical History:  Procedure Laterality Date   COLONOSCOPY  12/28/2020   by dr h. danis   FOOT ARTHRODESIS Left 05/28/2022   Procedure: REVISION TRIPLE ARTHRODESIS;  Surgeon: Elsa Lonni SAUNDERS, MD;  Location: [facility] OR;  Service: Orthopedics;  Laterality: Left;   HARDWARE REMOVAL Left 05/28/2022   Procedure: DEEP ORTHOPEDIC HARDWARE REMOVAL, LEFT FOOT POSTERIOR, MEDIAL AND LATERAL;  Surgeon: Elsa Lonni SAUNDERS, MD;  Location: [facility] OR;  Service: Orthopedics;  Laterality: Left;  LENGTH OF SURGERY: 180 MINUTES   INGUINAL HERNIA REPAIR Bilateral    right 1987 and left 1988   INGUINAL HERNIA REPAIR Bilateral 07/20/2021  Procedure: LAPAROSCOPIC RECURRENT BILATERAL INGUINAL HERNIA REPAIR WITH MESH;  Surgeon: Stechschulte, Deward PARAS, MD;  Location: [facility];  Service: General;  Laterality: Bilateral;   Social History   Tobacco Use   Smoking status: Never   Smokeless tobacco: Never  Vaping Use   Vaping status: Never Used  Substance Use Topics   Alcohol use: Yes    Alcohol/week: 2.0 - 3.0 standard drinks of alcohol    Types: 2 - 3 Standard drinks or equivalent per week    Comment: occasionally   Drug use: No      ROS: as noted in  HPI  Objective:     BP (!) 154/94   Pulse (!) 57   Temp 97.7 F (36.5 C) (Oral)   Wt 232 lb (105.2 kg)   SpO2 97%   BMI 36.34 kg/m  BP Readings from Last 3 Encounters:  06/11/23 (!) 154/94  12/17/22 130/80  06/24/22 138/76   Wt Readings from Last 3 Encounters:  06/11/23 232 lb (105.2 kg)  12/17/22 235 lb 6.1 oz (106.8 kg)  06/24/22 215 lb (97.5 kg)      Physical Exam Vitals and nursing note reviewed.  Constitutional:      General: He is not in acute distress.    Appearance: Normal appearance. He is well-developed. He is not ill-appearing, toxic-appearing or diaphoretic.  HENT:     Head: Normocephalic and atraumatic.     Right Ear: Tympanic membrane, ear canal and external ear normal. There is no impacted cerumen.     Left Ear: Tympanic membrane, ear canal and external ear normal. There is no impacted cerumen.     Nose: Congestion and rhinorrhea present.     Mouth/Throat:     Mouth: Mucous membranes are moist.     Pharynx: Oropharynx is clear. No oropharyngeal exudate or posterior oropharyngeal erythema.  Eyes:     General: No visual field deficit or scleral icterus.       Right eye: No discharge.        Left eye: No discharge.     Extraocular Movements: Extraocular movements intact.     Pupils: Pupils are equal, round, and reactive to light.  Cardiovascular:     Rate and Rhythm: Normal rate and regular rhythm.     Heart sounds: Normal heart sounds.  Pulmonary:     Effort: Pulmonary effort is normal. No respiratory distress.     Breath sounds: Normal breath sounds. No stridor. No wheezing, rhonchi or rales.  Chest:     Chest wall: No tenderness.  Musculoskeletal:     Cervical back: Normal range of motion and neck supple. No rigidity.  Lymphadenopathy:     Cervical: No cervical adenopathy.  Skin:    General: Skin is warm and dry.     Findings: No erythema or rash.  Neurological:     Mental Status: He is alert and oriented to person, place, and time.      Cranial Nerves: No cranial nerve deficit, dysarthria or facial asymmetry.     Sensory: No sensory deficit.     Motor: No weakness.  Psychiatric:        Mood and Affect: Mood normal.        Speech: Speech normal.        Behavior: Behavior normal.      Results for orders placed or performed in visit on 06/11/23  POC COVID-19 BinaxNow  Result Value Ref Range   SARS Coronavirus 2 Ag Positive (A)  Negative      The 10-year ASCVD risk score (Arnett DK, et al., 2019) is: 17.1%  Assessment & Plan:  Acute intractable headache, unspecified headache type -     POC COVID-19 BinaxNow -     Ketorolac  Tromethamine   Nasal congestion -     POC COVID-19 BinaxNow  COVID-19 -     nirmatrelvir /ritonavir ; Take 3 tablets by mouth 2 (two) times daily for 5 days. (Take nirmatrelvir  150 mg two tablets twice daily for 5 days and ritonavir  100 mg one tablet twice daily for 5 days) Patient GFR is 74  Dispense: 30 tablet; Refill: 0   Assessment and Plan    Headache New onset, left-sided headache with associated ear tingling, started yesterday. No associated fever, chills, vision changes, weakness, or slurred speech. No response to Advil . No history of similar headaches. -Administer Ketorolac  injection for pain relief. -COVID-19 swab test due to recent exposure - POSITIVE  Sinus Congestion Reports sinus congestion and use of over-the-counter phenylephrine  with minimal relief.  -Continue over-the-counter treatment with saline spray. -Consider further evaluation if symptoms persist or worsen.  Covid-19 Called in paxlovid  for pt to consider taking - benefits, risks, uses and side effects reviewed.         No follow-ups on file.   Benton LITTIE Gave, PA

## 2023-06-11 NOTE — Telephone Encounter (Signed)
 Chief Complaint: headache  Symptoms: headache and L-sided ear pain, sinus congestion, runny nose Frequency: since yesterday AM Pertinent Negatives: Patient denies CP, SOB, dizziness, blurry vision, weakness, N/V Disposition: [] ED /[] Urgent Care (no appt availability in office) / [x] Appointment(In office/virtual)/ []  [facility]/ [] Home Care/ [] Refused Recommended Disposition /[] [facility]/ []  Follow-up with PCP Additional Notes: Pt reports headache since yesterday AM and rates it 7/10. Also reports pain in the L ear. Pt has been taking ibuprofen  and took a goody powder but states that only helps for a quick second. Pt states he is taking his BP med, took it today. Denies dizziness, blurry vision, weakness, CP, SOB. Pt endorses sinus pain and a runny nose. Per protocol, pt scheduled today at 0920. RN advised pt to call 911 if he worsens. Pt verbalized understanding.   Copied from CRM [id number]. Topic: Clinical - Red Word Triage >> Jun 11, 2023  8:38 AM Melissa C wrote: Red Word that prompted transfer to Nurse Triage: patient's head is killing him he is in so much pain. He said his ear is also bothering him. He isn't sure if he is coming down with something or what is going on but he feels miserable Reason for Disposition  [1] SEVERE headache (e.g., excruciating) AND [2] not improved after 2 hours of pain medicine  Answer Assessment - Initial Assessment Questions 1. LOCATION: Where does it hurt?      Ringing/pain in L ear, headache to the L side of head (and back of head was hurting earlier, nose is running 2. ONSET: When did the headache start? (Minutes, hours or days)      Headache last couple days. This is the second day. 3. PATTERN: Does the pain come and go, or has it been constant since it started?     Constant 4. SEVERITY: How bad is the pain? and What does it keep you from doing?  (e.g., Scale 1-10; mild, moderate, or severe)   - MILD (1-3):  doesn't interfere with normal activities    - MODERATE (4-7): interferes with normal activities or awakens from sleep    - SEVERE (8-10): excruciating pain, unable to do any normal activities        7-8/10. Pt took ibuprofen  and a goody powder, worked for a quick second. 5. RECURRENT SYMPTOM: Have you ever had headaches before? If Yes, ask: When was the last time? and What happened that time?      No, I don't get headaches 6. CAUSE: What do you think is causing the headache?     Pt has hx HTN but states he took his BP med today. 7. MIGRAINE: Have you been diagnosed with migraine headaches? If Yes, ask: Is this headache similar?      No 8. HEAD INJURY: Has there been any recent injury to the head?      No 9. OTHER SYMPTOMS: Do you have any other symptoms? (fever, stiff neck, eye pain, sore throat, cold symptoms)     No dizziness, no blurry vision, no weakness. Has sniffles. [facility] and [facility] my nose and it helped but it's back again. Does not take BP at home. No CP or SOB.  Protocols used: [facility]

## 2023-06-13 ENCOUNTER — Telehealth: Payer: Self-pay | Admitting: Physician Assistant

## 2023-06-13 NOTE — Telephone Encounter (Signed)
 Please call pt and tell him we can not give a work note if patient has not been seen. He can schedule an appointment.

## 2023-06-13 NOTE — Telephone Encounter (Signed)
 Copied from CRM [id number]. Topic: General - Other >> Jun 13, 2023 10:17 AM Deaijah H wrote: Reason for CRM: Patient would Dr. Roark Chick or her nurse to give a call regarding a work note that is needed / please call [phone]

## 2023-06-13 NOTE — Telephone Encounter (Signed)
 Noted.

## 2023-06-13 NOTE — Telephone Encounter (Signed)
 Please call patient - I wrote him a work note yesterday. It is stapled to his after visit summary

## 2023-08-11 ENCOUNTER — Telehealth: Payer: Self-pay | Admitting: Podiatry

## 2023-08-11 NOTE — Telephone Encounter (Signed)
 See prev notes. I spoke with pt

## 2023-08-12 ENCOUNTER — Telehealth: Payer: Self-pay | Admitting: Podiatry

## 2023-08-12 NOTE — Telephone Encounter (Signed)
 lft mess advising pt I left ph# for medical recrds on his vmail on yesterday [phone]. He left mess seeing it they were ready for pick up?? I lft ph# again on his vmail to get medical records.

## 2023-08-21 ENCOUNTER — Telehealth: Payer: Self-pay

## 2023-08-21 NOTE — Telephone Encounter (Signed)
 Left msg for Troy Arellano to call the office back. His medical records are ready to be picked up from the front desk.

## 2023-08-24 ENCOUNTER — Other Ambulatory Visit: Payer: Self-pay | Admitting: Physician Assistant

## 2023-08-25 ENCOUNTER — Other Ambulatory Visit: Payer: Self-pay | Admitting: Physician Assistant

## 2023-09-09 NOTE — Progress Notes (Signed)
 This encounter was created in error - please disregard.

## 2023-11-24 ENCOUNTER — Other Ambulatory Visit: Payer: Self-pay | Admitting: Physician Assistant

## 2023-11-26 ENCOUNTER — Telehealth

## 2023-11-26 ENCOUNTER — Ambulatory Visit: Payer: Self-pay

## 2023-11-26 NOTE — Telephone Encounter (Signed)
 FYI Only or Action Required?: FYI only for provider.  Patient was last seen in primary care on 06/11/2023 by Lowella Benton CROME, PA.  Called Nurse Triage reporting Back Pain.  Symptoms began Sunday.  Interventions attempted: OTC medications: tylenol .  Symptoms are: gradually worsening.  Triage Disposition: See PCP When Office is Open (Within 3 Days)  Patient/caregiver understands and will follow disposition?: Yes   Copied from CRM #[id number]. Topic: Clinical - Red Word Triage >> Nov 26, 2023  8:08 AM Avram MATSU wrote: Red Word that prompted transfer to Nurse Triage: painful lower back Reason for Disposition  [1] MODERATE back pain (e.g., interferes with normal activities) AND [2] present > 3 days  Answer Assessment - Initial Assessment Questions 1. ONSET: When did the pain begin? (e.g., minutes, hours, days)     Sunday and worsening 2. LOCATION: Where does it hurt? (upper, mid or lower back)     Lower back  3. SEVERITY: How bad is the pain?  (e.g., Scale 1-10; mild, moderate, or severe)     8/10 - taking tylenol  without relief 4. PATTERN: Is the pain constant? (e.g., yes, no; constant, intermittent)      constant 5. RADIATION: Does the pain shoot into your legs or somewhere else?     na 6. CAUSE:  What do you think is causing the back pain?      unknown 7. BACK OVERUSE:  Any recent lifting of heavy objects, strenuous work or exercise?     na 8. MEDICINES: What have you taken so far for the pain? (e.g., nothing, acetaminophen , NSAIDS)     tylenol  9. NEUROLOGIC SYMPTOMS: Do you have any weakness, numbness, or problems with bowel/bladder control?     no 10. OTHER SYMPTOMS: Do you have any other symptoms? (e.g., fever, abdomen pain, burning with urination, blood in urine)       no 11. PREGNANCY: Is there any chance you are pregnant? When was your last menstrual period?       na  Protocols used: Back Pain-[facility]

## 2023-11-26 NOTE — Telephone Encounter (Signed)
 Appt today

## 2023-11-27 ENCOUNTER — Encounter: Payer: Self-pay | Admitting: Family

## 2023-11-27 ENCOUNTER — Ambulatory Visit

## 2023-11-27 ENCOUNTER — Ambulatory Visit: Admitting: Family

## 2023-11-27 VITALS — BP 156/91 | HR 76 | Temp 98.1°F | Ht 67.0 in | Wt 226.4 lb

## 2023-11-27 DIAGNOSIS — M545 Low back pain, unspecified: Secondary | ICD-10-CM

## 2023-11-27 DIAGNOSIS — I10 Essential (primary) hypertension: Secondary | ICD-10-CM | POA: Diagnosis not present

## 2023-11-27 MED ORDER — PREDNISONE 10 MG PO TABS: ORAL_TABLET | ORAL | 0 refills | Status: AC

## 2023-11-27 NOTE — Progress Notes (Signed)
 Patient ID: Troy Arellano, male    DOB: 10-05-1965, 58 y.o.   MRN: [id number]  Chief Complaint  Patient presents with   Back Pain    Pt c/o lower back pain, present 4-5 days. Pt states he lifted a box up at home and he felt a sharp pain. Has tried tylenol  and icy hot, which did not help sx.   Discussed the use of AI scribe software for clinical note transcription with the patient, who gave verbal consent to proceed.  History of Present Illness Troy Arellano is a 58 year old male who presents with sharp, continuous back pain.  Back pain - Sharp, continuous pain located in the center to right side of the back - Described as a stabbing sensation - Onset on Sunday, persisted into Monday - No radiation of pain to the leg - No groin pain - Reports lifting heavy box at home but no other activities that could have caused injury - Pain affects ability to work; missed one day of work - No similar issues in the lower back previously - History of disc issue in middle back approximately nine years ago, treated with injections and resolved  Pain management and response to treatment - Uses Icy Hot and Tylenol  for pain relief without significant improvement - Prescribed meloxicam  in January but has not taken it recently  Neuropathic pain and medication use - Takes gabapentin  as needed for nerve pain related to previous foot surgeries  Hypertension and medication adherence - Takes blood pressure medication - Missed dose of blood pressure medication this morning  Assessment & Plan Low Back Pain Acute low back pain with sharp, stabbing sensation localized to lower back and right buttock. Differential includes musculoskeletal issues, no red flag concerns, no difficulty urinating. Prednisone  considered to reduce inflammation. Informed about side effects of prednisone . - Order x-ray of the lower back. - Prescribe prednisone  with tapering dose for 7 days, to be taken in the morning with breakfast. -  Advised against heavy lifting, pushing, or pulling for 1-2 weeks. - Encourage walking and light activity. - Refer to [employer] for further evaluation and potential MRI. - Provide work note for absence through next Friday.  Hypertension Hypertension management ongoing, did not take medication yet today. Blood pressure needs checking before leaving. - Take medication as soon as possible when back home, drink at least 2.5 liters caffeine free fluid daily.   Subjective:    Outpatient Medications Prior to Visit  Medication Sig Dispense Refill   acetaminophen  (TYLENOL ) 325 MG tablet Take 2 tablets (650 mg total) by mouth every 6 (six) hours as needed for fever or headache.     aspirin  (BAYER ASPIRIN ) 325 MG tablet Take 1 tablet by mouth daily for 30 DAYS for blood clot prevention 30 tablet 0   gabapentin  (NEURONTIN ) 300 MG capsule Take 1 capsule (300 mg total) by mouth at bedtime. 90 capsule 1   hydrochlorothiazide  (HYDRODIURIL ) 25 MG tablet TAKE 1 TABLET (25 MG TOTAL) BY MOUTH DAILY. 90 tablet 0   hydrOXYzine  (VISTARIL ) 25 MG capsule TAKE 1 CAPSULE (25 MG TOTAL) BY MOUTH AT BEDTIME AS NEEDED (SLEEP). 90 capsule 0   meloxicam  (MOBIC ) 15 MG tablet TAKE 1 TABLET (15 MG TOTAL) BY MOUTH DAILY. (Patient not taking: Reported on 11/27/2023) 90 tablet 0   No facility-administered medications prior to visit.   Past Medical History:  Diagnosis Date   Arthritis    Back   Benign localized prostatic hyperplasia with lower urinary tract symptoms (  LUTS)    urologist--- dr lovie   Bilateral recurrent inguinal hernia    GERD (gastroesophageal reflux disease)    History of adenomatous polyp of colon    History of COVID-19 05/2020   per pt mild symptoms that resovled   Hyperlipidemia    Hypertension    followed by pcp   Type 2 diabetes mellitus (HCC)    followed by pcp   (07-17-2021  per pt does not take check blood sugar )   Past Surgical History:  Procedure Laterality Date   COLONOSCOPY   12/28/2020   by dr h. danis   FOOT ARTHRODESIS Left 05/28/2022   Procedure: REVISION TRIPLE ARTHRODESIS;  Surgeon: Elsa Lonni SAUNDERS, MD;  Location[facility] OR;  Service: Orthopedics;  Laterality: Left;   HARDWARE REMOVAL Left 05/28/2022   Procedure: DEEP ORTHOPEDIC HARDWARE REMOVAL, LEFT FOOT POSTERIOR, MEDIAL AND LATERAL;  Surgeon: Elsa Lonni SAUNDERS, MD;  Location: [facility] OR;  Service: Orthopedics;  Laterality: Left;  LENGTH OF SURGERY: 180 MINUTES   INGUINAL HERNIA REPAIR Bilateral    right 1987 and left 1988   INGUINAL HERNIA REPAIR Bilateral 07/20/2021   Procedure: LAPAROSCOPIC RECURRENT BILATERAL INGUINAL HERNIA REPAIR WITH MESH;  Surgeon: Stechschulte, Deward PARAS, MD;  Location: [facility];  Service: General;  Laterality: Bilateral;   No Known Allergies    Objective:    Physical Exam Vitals and nursing note reviewed.  Constitutional:      General: He is not in acute distress.    Appearance: Normal appearance. He is obese.  HENT:     Head: Normocephalic.  Cardiovascular:     Rate and Rhythm: Normal rate and regular rhythm.  Pulmonary:     Effort: Pulmonary effort is normal.     Breath sounds: Normal breath sounds.  Musculoskeletal:     Cervical back: Normal range of motion.     Lumbar back: Bony tenderness present. No edema, signs of trauma or tenderness. Decreased range of motion.  Skin:    General: Skin is warm and dry.  Neurological:     Mental Status: He is alert and oriented to person, place, and time.  Psychiatric:        Mood and Affect: Mood normal.    BP (!) 156/91   Pulse 76   Temp 98.1 F (36.7 C) (Temporal)   Ht 5' 7 (1.702 m)   Wt 226 lb 6 oz (102.7 kg)   SpO2 95%   BMI 35.46 kg/m  Wt Readings from Last 3 Encounters:  11/27/23 226 lb 6 oz (102.7 kg)  06/11/23 232 lb (105.2 kg)  12/17/22 235 lb 6.1 oz (106.8 kg)      Lucius Krabbe, NP

## 2023-12-04 ENCOUNTER — Ambulatory Visit: Payer: Self-pay | Admitting: Family

## 2024-02-20 ENCOUNTER — Other Ambulatory Visit: Payer: Self-pay | Admitting: Physician Assistant
